# Patient Record
Sex: Female | Born: 1950 | Race: White | Hispanic: No | Marital: Married | Smoking: Never smoker
Health system: Southern US, Community
[De-identification: ages and names within clinical notes are randomized; demographics above are authoritative.]

## PROBLEM LIST (undated history)

## (undated) DIAGNOSIS — M1990 Unspecified osteoarthritis, unspecified site: Secondary | ICD-10-CM

## (undated) DIAGNOSIS — E079 Disorder of thyroid, unspecified: Secondary | ICD-10-CM

## (undated) DIAGNOSIS — K219 Gastro-esophageal reflux disease without esophagitis: Secondary | ICD-10-CM

## (undated) DIAGNOSIS — Z8619 Personal history of other infectious and parasitic diseases: Secondary | ICD-10-CM

## (undated) DIAGNOSIS — E559 Vitamin D deficiency, unspecified: Secondary | ICD-10-CM

## (undated) DIAGNOSIS — I10 Essential (primary) hypertension: Secondary | ICD-10-CM

## (undated) HISTORY — DX: Disorder of thyroid, unspecified: E07.9

## (undated) HISTORY — DX: Unspecified osteoarthritis, unspecified site: M19.90

## (undated) HISTORY — DX: Personal history of other infectious and parasitic diseases: Z86.19

## (undated) HISTORY — DX: Vitamin D deficiency, unspecified: E55.9

## (undated) HISTORY — DX: Gastro-esophageal reflux disease without esophagitis: K21.9

---

## 1997-06-21 HISTORY — PX: CHOLECYSTECTOMY: SHX55

## 2000-01-26 DIAGNOSIS — M8580 Other specified disorders of bone density and structure, unspecified site: Secondary | ICD-10-CM | POA: Insufficient documentation

## 2003-02-12 DIAGNOSIS — E782 Mixed hyperlipidemia: Secondary | ICD-10-CM | POA: Insufficient documentation

## 2003-12-27 DIAGNOSIS — K219 Gastro-esophageal reflux disease without esophagitis: Secondary | ICD-10-CM | POA: Insufficient documentation

## 2005-08-30 DIAGNOSIS — M76899 Other specified enthesopathies of unspecified lower limb, excluding foot: Secondary | ICD-10-CM | POA: Insufficient documentation

## 2006-03-30 DIAGNOSIS — Z803 Family history of malignant neoplasm of breast: Secondary | ICD-10-CM | POA: Insufficient documentation

## 2006-03-30 DIAGNOSIS — N6019 Diffuse cystic mastopathy of unspecified breast: Secondary | ICD-10-CM | POA: Insufficient documentation

## 2006-05-09 ENCOUNTER — Encounter: Admission: RE | Admit: 2006-05-09 | Discharge: 2006-05-09 | Payer: Self-pay | Admitting: Internal Medicine

## 2007-05-11 ENCOUNTER — Encounter: Admission: RE | Admit: 2007-05-11 | Discharge: 2007-05-11 | Payer: Self-pay

## 2008-05-13 ENCOUNTER — Encounter: Admission: RE | Admit: 2008-05-13 | Discharge: 2008-05-13 | Payer: Self-pay | Admitting: Obstetrics and Gynecology

## 2008-05-20 ENCOUNTER — Encounter: Admission: RE | Admit: 2008-05-20 | Discharge: 2008-05-20 | Payer: Self-pay | Admitting: Obstetrics and Gynecology

## 2008-06-21 HISTORY — PX: PARATHYROIDECTOMY: SHX19

## 2009-01-06 DIAGNOSIS — E213 Hyperparathyroidism, unspecified: Secondary | ICD-10-CM | POA: Insufficient documentation

## 2009-02-27 ENCOUNTER — Encounter (HOSPITAL_COMMUNITY): Admission: RE | Admit: 2009-02-27 | Discharge: 2009-04-25 | Payer: Self-pay | Admitting: Endocrinology

## 2009-04-25 ENCOUNTER — Ambulatory Visit (HOSPITAL_COMMUNITY): Admission: RE | Admit: 2009-04-25 | Discharge: 2009-04-25 | Payer: Self-pay | Admitting: Surgery

## 2009-04-25 ENCOUNTER — Encounter (INDEPENDENT_AMBULATORY_CARE_PROVIDER_SITE_OTHER): Payer: Self-pay | Admitting: Surgery

## 2009-05-14 ENCOUNTER — Encounter: Admission: RE | Admit: 2009-05-14 | Discharge: 2009-05-14 | Payer: Self-pay | Admitting: Obstetrics and Gynecology

## 2009-07-01 ENCOUNTER — Encounter: Admission: RE | Admit: 2009-07-01 | Discharge: 2009-07-01 | Payer: Self-pay | Admitting: Surgery

## 2010-05-19 ENCOUNTER — Encounter: Admission: RE | Admit: 2010-05-19 | Discharge: 2010-05-19 | Payer: Self-pay | Admitting: Obstetrics and Gynecology

## 2010-06-21 LAB — HM DEXA SCAN

## 2010-07-01 ENCOUNTER — Encounter
Admission: RE | Admit: 2010-07-01 | Discharge: 2010-07-01 | Payer: Self-pay | Source: Home / Self Care | Attending: Surgery | Admitting: Surgery

## 2010-07-13 ENCOUNTER — Encounter: Payer: Self-pay | Admitting: Endocrinology

## 2010-08-24 ENCOUNTER — Other Ambulatory Visit: Payer: Self-pay | Admitting: Internal Medicine

## 2010-08-28 ENCOUNTER — Ambulatory Visit
Admission: RE | Admit: 2010-08-28 | Discharge: 2010-08-28 | Disposition: A | Discharge status: Home / Self Care | Payer: BC Managed Care – HMO | Source: Ambulatory Visit | Attending: Internal Medicine | Admitting: Internal Medicine

## 2010-09-23 LAB — URINALYSIS, ROUTINE W REFLEX MICROSCOPIC
Bilirubin Urine: NEGATIVE
Glucose, UA: NEGATIVE mg/dL
Hgb urine dipstick: NEGATIVE
Ketones, ur: NEGATIVE mg/dL
Nitrite: NEGATIVE
Protein, ur: NEGATIVE mg/dL
Specific Gravity, Urine: 1.024 (ref 1.005–1.030)
Urobilinogen, UA: 0.2 mg/dL (ref 0.0–1.0)
pH: 6 (ref 5.0–8.0)

## 2010-09-23 LAB — DIFFERENTIAL
Basophils Absolute: 0 10*3/uL (ref 0.0–0.1)
Basophils Relative: 0 % (ref 0–1)
Eosinophils Absolute: 0.1 10*3/uL (ref 0.0–0.7)
Eosinophils Relative: 1 % (ref 0–5)
Lymphocytes Relative: 17 % (ref 12–46)
Lymphs Abs: 1.3 10*3/uL (ref 0.7–4.0)
Monocytes Absolute: 0.6 10*3/uL (ref 0.1–1.0)
Monocytes Relative: 7 % (ref 3–12)
Neutro Abs: 6 10*3/uL (ref 1.7–7.7)
Neutrophils Relative %: 75 % (ref 43–77)

## 2010-09-23 LAB — BASIC METABOLIC PANEL
BUN: 11 mg/dL (ref 6–23)
CO2: 29 mEq/L (ref 19–32)
Calcium: 10.5 mg/dL (ref 8.4–10.5)
Chloride: 109 mEq/L (ref 96–112)
Creatinine, Ser: 0.8 mg/dL (ref 0.4–1.2)
GFR calc Af Amer: >60 mL/min (ref >60)
GFR calc non Af Amer: >60 mL/min (ref >60)
Glucose, Bld: 95 mg/dL (ref 70–99)
Potassium: 4.5 mEq/L (ref 3.5–5.1)
Sodium: 141 mEq/L (ref 135–145)

## 2010-09-23 LAB — CBC
HCT: 41.8 % (ref 36.0–46.0)
Hemoglobin: 14 g/dL (ref 12.0–15.0)
MCHC: 33.6 g/dL (ref 30.0–36.0)
MCV: 90 fL (ref 78.0–100.0)
Platelets: 248 10*3/uL (ref 150–400)
RBC: 4.64 MIL/uL (ref 3.87–5.11)
RDW: 13 % (ref 11.5–15.5)
WBC: 8 10*3/uL (ref 4.0–10.5)

## 2010-09-23 LAB — CALCIUM: Calcium: 9.3 mg/dL (ref 8.4–10.5)

## 2010-09-23 LAB — PROTIME-INR
INR: 0.91 (ref 0.00–1.49)
Prothrombin Time: 12.2 seconds (ref 11.6–15.2)

## 2011-04-13 ENCOUNTER — Other Ambulatory Visit: Payer: Self-pay | Admitting: Obstetrics and Gynecology

## 2011-04-13 DIAGNOSIS — Z1231 Encounter for screening mammogram for malignant neoplasm of breast: Secondary | ICD-10-CM

## 2011-04-22 LAB — HM PAP SMEAR: HM Pap smear: NEGATIVE

## 2011-05-21 ENCOUNTER — Ambulatory Visit
Admission: RE | Admit: 2011-05-21 | Discharge: 2011-05-21 | Disposition: A | Discharge status: Home / Self Care | Payer: BC Managed Care – HMO | Source: Ambulatory Visit | Attending: Obstetrics and Gynecology | Admitting: Obstetrics and Gynecology

## 2011-05-21 DIAGNOSIS — Z1231 Encounter for screening mammogram for malignant neoplasm of breast: Secondary | ICD-10-CM

## 2011-07-31 ENCOUNTER — Emergency Department (HOSPITAL_COMMUNITY)
Admission: EM | Admit: 2011-07-31 | Discharge: 2011-08-01 | Disposition: A | Discharge status: Home / Self Care | Payer: BC Managed Care – PPO | Attending: Emergency Medicine | Admitting: Emergency Medicine

## 2011-07-31 ENCOUNTER — Emergency Department (HOSPITAL_COMMUNITY): Payer: BC Managed Care – PPO

## 2011-07-31 ENCOUNTER — Encounter (HOSPITAL_COMMUNITY): Payer: Self-pay | Admitting: *Deleted

## 2011-07-31 DIAGNOSIS — M79609 Pain in unspecified limb: Secondary | ICD-10-CM | POA: Insufficient documentation

## 2011-07-31 DIAGNOSIS — S92353A Displaced fracture of fifth metatarsal bone, unspecified foot, initial encounter for closed fracture: Secondary | ICD-10-CM

## 2011-07-31 DIAGNOSIS — I10 Essential (primary) hypertension: Secondary | ICD-10-CM | POA: Insufficient documentation

## 2011-07-31 DIAGNOSIS — S92309A Fracture of unspecified metatarsal bone(s), unspecified foot, initial encounter for closed fracture: Secondary | ICD-10-CM | POA: Insufficient documentation

## 2011-07-31 DIAGNOSIS — X500XXA Overexertion from strenuous movement or load, initial encounter: Secondary | ICD-10-CM | POA: Insufficient documentation

## 2011-07-31 DIAGNOSIS — Z79899 Other long term (current) drug therapy: Secondary | ICD-10-CM | POA: Insufficient documentation

## 2011-07-31 HISTORY — DX: Essential (primary) hypertension: I10

## 2011-07-31 MED ORDER — OXYCODONE-ACETAMINOPHEN 5-325 MG PO TABS: 1.0000 | ORAL_TABLET | Freq: Once | ORAL | Status: DC

## 2011-07-31 MED ORDER — OXYCODONE-ACETAMINOPHEN 5-325 MG PO TABS: 1.0000 | ORAL_TABLET | ORAL | Status: AC | PRN

## 2011-07-31 NOTE — ED Notes (Signed)
Pt states that she hurt her left foot the other day and she had a "cramp" in it , states that she "turned it over today" and now she is having pain in her left foot

## 2011-07-31 NOTE — ED Provider Notes (Signed)
History     CSN: [id number]  Arrival date & time 07/31/11  2104   First MD Initiated Contact with Patient 07/31/11 2333    HPI This reports she was getting up with him and he twisted her left foot. Reports pain and inability to bear weight on left foot since. Denies significant swelling or bruising. Numbness, tingling, weakness, discoloration. Patient is a 61 y.o. female presenting with foot injury. The history is provided by the patient.  Foot Injury  The incident occurred at home. There was no injury mechanism. The pain is present in the left foot. The pain is moderate. The pain has been constant since onset. Associated symptoms include inability to bear weight. Pertinent negatives include no numbness, no loss of motion, no muscle weakness, no loss of sensation and no tingling. She reports no foreign bodies present. The symptoms are aggravated by bearing weight, activity and palpation. She has tried rest and NSAIDs for the symptoms. The treatment provided no relief.    Past Medical History  Diagnosis Date  . Hypertension     Past Surgical History  Procedure Date  . Cholecystectomy     No family history on file.  History  Substance Use Topics  . Smoking status: Never Smoker   . Smokeless tobacco: Not on file  . Alcohol Use: No    OB History    Grav Para Term Preterm Abortions TAB SAB Ect Mult Living                  Review of Systems  Musculoskeletal:       Foot pain  Neurological: Negative for tingling and numbness.  All other systems reviewed and are negative.    Allergies  Review of patient's allergies indicates no known allergies.  Home Medications   Current Outpatient Rx  Name Route Sig Dispense Refill  . CETIRIZINE HCL 10 MG PO TABS Oral Take 10 mg by mouth daily.    [name] CONJ ESTROG-MEDROXYPROGEST ACE 0.45-1.5 MG PO TABS  1 tablet daily.    . OMEGA-3-ACID ETHYL ESTERS 1 G PO CAPS Oral Take 2 g by mouth 2 (two) times daily.    [name] VITAMIN D (ERGOCALCIFEROL)  50000 UNITS PO CAPS Oral Take 50,000 Units by mouth.      BP 159/77  Pulse 80  Temp(Src) 98.2 F (36.8 C) (Oral)  Resp 18  Ht 5\' 4"  (1.626 m)  Wt 154 lb (69.854 kg)  BMI 26.43 kg/m2  SpO2 100%  Physical Exam  Vitals reviewed. Constitutional: She is oriented to person, place, and time. Vital signs are normal. She appears well-developed and well-nourished. No distress.  HENT:  Head: Normocephalic and atraumatic.  Eyes: Pupils are equal, round, and reactive to light.  Neck: Neck supple.  Pulmonary/Chest: Effort normal.  Musculoskeletal:       Left foot: She exhibits decreased range of motion, tenderness and bony tenderness. She exhibits no swelling, normal capillary refill, no crepitus, no deformity and no laceration.       Feet:  Neurological: She is alert and oriented to person, place, and time.  Skin: Skin is warm and dry. No rash noted. No erythema. No pallor.  Psychiatric: She has a normal mood and affect. Her behavior is normal.    ED Course  Procedures  Dg Foot Complete Left  07/31/2011  *RADIOLOGY REPORT*  Clinical Data: Fifth metatarsal pain after twisting injury 2 days ago.  LEFT FOOT - COMPLETE 3+ VIEW  Comparison: None.  Findings: There is  a nondisplaced transverse fracture of the proximal left fifth metatarsal bone.  Left foot appears otherwise intact.  No focal bone lesion or bone destruction.  No subluxation. No radiopaque foreign bodies in the soft tissues.  IMPRESSION: Nondisplaced transverse fracture of the proximal left fifth metatarsal bone.  Original Report Authenticated By: Marlon Pel, M.D.    MDM   This patient posterior splint and crutches. Advised patient to followup with orthopedic physician later on next week for close management and evaluation. Patient and spouse agreeable to plan and are in discharge     Thomasene Lot, PA-C 07/31/11 2345  Thomasene Lot, PA-C 07/31/11 [phone]

## 2011-08-01 NOTE — ED Provider Notes (Signed)
Medical screening examination/treatment/procedure(s) were performed by non-physician practitioner and as supervising physician I was immediately available for consultation/collaboration.  Jasmine Awe, MD 08/01/11 [phone]

## 2012-04-11 ENCOUNTER — Other Ambulatory Visit: Payer: Self-pay | Admitting: Obstetrics and Gynecology

## 2012-04-11 DIAGNOSIS — Z1231 Encounter for screening mammogram for malignant neoplasm of breast: Secondary | ICD-10-CM

## 2012-04-28 ENCOUNTER — Other Ambulatory Visit: Payer: Self-pay | Admitting: Obstetrics and Gynecology

## 2012-04-28 DIAGNOSIS — N6310 Unspecified lump in the right breast, unspecified quadrant: Secondary | ICD-10-CM

## 2012-05-01 ENCOUNTER — Telehealth: Payer: Self-pay | Admitting: Genetic Counselor

## 2012-05-01 NOTE — Telephone Encounter (Signed)
LVOM for pt to return call.  °

## 2012-05-03 ENCOUNTER — Encounter (INDEPENDENT_AMBULATORY_CARE_PROVIDER_SITE_OTHER): Payer: Self-pay | Admitting: Surgery

## 2012-05-08 ENCOUNTER — Encounter (INDEPENDENT_AMBULATORY_CARE_PROVIDER_SITE_OTHER): Payer: BC Managed Care – HMO | Admitting: Surgery

## 2012-05-08 LAB — HM MAMMOGRAPHY

## 2012-05-09 ENCOUNTER — Ambulatory Visit (INDEPENDENT_AMBULATORY_CARE_PROVIDER_SITE_OTHER): Payer: BC Managed Care – HMO | Admitting: Surgery

## 2012-05-09 ENCOUNTER — Encounter (INDEPENDENT_AMBULATORY_CARE_PROVIDER_SITE_OTHER): Payer: Self-pay | Admitting: Surgery

## 2012-05-09 VITALS — HR 78 | Temp 99.3°F | Ht 64.5 in | Wt 161.8 lb

## 2012-05-09 DIAGNOSIS — R1013 Epigastric pain: Secondary | ICD-10-CM

## 2012-05-09 DIAGNOSIS — G8929 Other chronic pain: Secondary | ICD-10-CM | POA: Insufficient documentation

## 2012-05-09 NOTE — Progress Notes (Signed)
Subjective:     Patient ID: EVELETTE PALLAN, female   DOB: Sep 12, 1950, 61 y.o.   MRN: [id number]  HPI This is a very pleasant 61 year old female who I know personally. I am seeing her for both had breast problems and abdominal problem referred by Dr. Thea Silversmith.  Her gynecologist actually felt a small nodule around the right areola. She has had no problems with her breasts and no nipple discharge. She has been having pain in the epigastric area with physical activity for the last several months. She feels an occasional knot with sharp discomfort in her upper down the wall. She has had no nausea or vomiting. She has had a previous laparoscopic cholecystectomy  Review of Systems Otherwise unremarkable    Objective:   Physical Exam There is no right axillary adenopathy. I can feel a small cyst at the areola at the 1:00 position. Otherwise there are no breast masses. She has a tiny reducible umbilical hernia. There is a fullness in her epigastric area which is tender. I cannot feel a fascial defect. There may be incarcerated fat and a hernia defect.  Her mammograms and an ultrasound showed only a slightly dilated ductal of the right breast with no other abnormalities and no masses    Assessment:     Abdominal pain of uncertain etiology    Plan:     Given her exam and her strong family history of malignancy, I able to get a CAT scan of her abdomen and pelvis to better evaluate the abdominal wall and let me assess the source of her discomfort. I will call her back with the results

## 2012-05-12 ENCOUNTER — Ambulatory Visit
Admission: RE | Admit: 2012-05-12 | Discharge: 2012-05-12 | Disposition: A | Discharge status: Home / Self Care | Payer: BC Managed Care – PPO | Source: Ambulatory Visit | Attending: Surgery | Admitting: Surgery

## 2012-05-12 DIAGNOSIS — G8929 Other chronic pain: Secondary | ICD-10-CM

## 2012-05-12 MED ORDER — IOHEXOL 300 MG/ML  SOLN
100.0000 mL | Freq: Once | INTRAMUSCULAR | Status: AC | PRN
  Administered 2012-05-12: 100 mL via INTRAVENOUS

## 2012-05-24 ENCOUNTER — Other Ambulatory Visit: Payer: BC Managed Care – PPO

## 2012-05-24 ENCOUNTER — Ambulatory Visit: Payer: BC Managed Care – PPO

## 2012-06-09 ENCOUNTER — Ambulatory Visit (INDEPENDENT_AMBULATORY_CARE_PROVIDER_SITE_OTHER): Payer: BC Managed Care – HMO | Admitting: General Surgery

## 2012-06-23 ENCOUNTER — Encounter (INDEPENDENT_AMBULATORY_CARE_PROVIDER_SITE_OTHER): Payer: BC Managed Care – HMO | Admitting: Surgery

## 2012-07-20 ENCOUNTER — Other Ambulatory Visit: Payer: BC Managed Care – PPO | Admitting: Lab

## 2012-07-20 ENCOUNTER — Ambulatory Visit (HOSPITAL_BASED_OUTPATIENT_CLINIC_OR_DEPARTMENT_OTHER): Payer: BC Managed Care – PPO | Admitting: Genetic Counselor

## 2012-07-20 ENCOUNTER — Encounter: Payer: Self-pay | Admitting: Genetic Counselor

## 2012-07-20 DIAGNOSIS — IMO0002 Reserved for concepts with insufficient information to code with codable children: Secondary | ICD-10-CM

## 2012-07-20 DIAGNOSIS — Z806 Family history of leukemia: Secondary | ICD-10-CM

## 2012-07-20 DIAGNOSIS — Z800 Family history of malignant neoplasm of digestive organs: Secondary | ICD-10-CM

## 2012-07-20 DIAGNOSIS — Z803 Family history of malignant neoplasm of breast: Secondary | ICD-10-CM

## 2012-07-20 NOTE — Progress Notes (Signed)
Dr.  Aram Beecham Romine requested a consultation for genetic counseling and risk assessment for Jessica Crosby, a 62 y.o. female, for discussion of her family history of breast, prostate, pancreatic, colon and thyroid cancer. She presents to clinic today to discuss the possibility of a genetic predisposition to cancer, and to further clarify her risks, as well as her family members' risks for cancer.   HISTORY OF PRESENT ILLNESS: Jessica Crosby is a 62 y.o. female with no personal history of cancer.    Past Medical History  Diagnosis Date  . Hypertension   . GERD (gastroesophageal reflux disease)   . Thyroid disease   . OP (osteoporosis)   . Vitamin D deficiency   . Arthritis     Past Surgical History  Procedure Date  . Cholecystectomy   . Parathyroidectomy 2010    History  Substance Use Topics  . Smoking status: Never Smoker   . Smokeless tobacco: Not on file  . Alcohol Use: No    REPRODUCTIVE HISTORY AND PERSONAL RISK ASSESSMENT FACTORS: Menarche was at age 61.   Menopause at 45 Uterus Intact: Yes Ovaries Intact: Yes G3P3A0 , first live birth at age 26  She has not previously undergone treatment for infertility.   OCP use for 15 years   She has used HRT in the past.    FAMILY HISTORY:  We obtained a detailed, 4-generation family history.  Significant diagnoses are listed below: Family History  Problem Relation Age of Onset  . Breast cancer Sister 65  . Prostate cancer Father 65  . Breast cancer Maternal Aunt 81  . Prostate cancer Paternal Uncle   . Throat cancer Paternal Grandfather     pipe smoker  . Breast cancer Sister 36  . Lymphoma Sister 31  . Leukemia Sister 63    as a result of chemotherapy from her lymphoma  . Pancreatic cancer Maternal Aunt 65  . Colon cancer Maternal Aunt     diagnosed in her 26s  . Thyroid cancer Maternal Aunt     dx in her 15s  . Leukemia Maternal Aunt     diagnosed in her 40s  . Breast cancer Cousin    diagnosed in her 65s  . Prostate cancer Paternal Uncle   The patient has not been diagnosed with cancer.  She has three brothers and four sisters.  One sister was diagnosed with breast cancer at age 21 and tested negative for BRCA mutations, a second sister was diagnosed with breast cancer and tested negative for BRCA mutations, and a third sister was diagnosed with non-hodgkins lymphoma in the socket of her shoulder and leukemia as the result of her chemotherapy.  The patient's father and two of his four brothers were diagnosed with prostate cancer in their 53s.  The patient's mother died at 27 and never had cancer.  She had nine brothers and sisters.  One sister had breast cancer at 70, another had pancreatic cancer at 33, a third had colon cancer in her 17s and thyroid cancer in her 65s, and a fourth had leukemia in her 67s.  A fifth sister had a daughter who had breast cancer in her 55s.  There is no other reported history of cancer.  Patient's maternal ancestors are of [place] descent, and paternal ancestors are of [place] descent. There is no reported Ashkenazi Jewish ancestry. There is no  known consanguinity.  GENETIC COUNSELING RISK ASSESSMENT, DISCUSSION, AND SUGGESTED FOLLOW UP: We reviewed the natural history and genetic etiology  of sporadic, familial and hereditary cancer syndromes.  About 5-10% of breast cancer is hereditary.  Of this, about 85% is the result of a BRCA1 or BRCA2 mutation.  We reviewed the red flags of hereditary cancer syndromes and the dominant inheritance patterns.  If the BRCA testing is negative, we discussed that we could be testing for the wrong gene.  We discussed gene panels, and that several cancer genes that are associated with different cancers can be tested at the same time.  Because of the different types of cancer that are in the patient's family, we will consider one of the panel tests if she is negative for BRCA mutations.   The patient's family history of  breast, pancreatic, colon and thyroid cancer is suggestive of the following possible diagnosis: hereditary cancer syndrome  We discussed that identification of a hereditary cancer syndrome may help her care providers tailor the patients medical management. If a mutation indicating a hereditary cancer syndrome is detected in this case, the [employer] recommendations would include increased cancer surveillance and possible prophylactic surgery. If a mutation is detected, the patient will be referred back to the referring provider and to any additional appropriate care providers to discuss the relevant options.   If a mutation is not found in the patient, cancer surveillance options would be discussed for the patient according to the appropriate standard National Comprehensive Cancer Network and American Cancer Society guidelines, with consideration of their personal and family history risk factors. In this case, the patient will be referred back to their care providers for discussions of management.   Based on the patient's personal and family history, statistical models (Tyrer Cusik)  and literature data were used to estimate her risk of developing breast cancer. These estimate her lifetime risk of developing breast to be approximately 16.4%. This estimation does not take into account any genetic testing results.   After considering the risks, benefits, and limitations, the patient provided informed consent for  the following  testing: BRACAnalysis and MyRisk through [employer].   Per the patient's request, we will contact her by telephone to discuss these results. A follow up genetic counseling visit will be scheduled if indicated.  The patient was seen for a total of 60 minutes, greater than 50% of which was spent face-to-face counseling.  This plan is being carried out per Dr. Edyth Gunnels recommendations.  This note will also be sent to the referring  provider via the electronic medical record. The patient will be supplied with a summary of this genetic counseling discussion as well as educational information on the discussed hereditary cancer syndromes following the conclusion of their visit.   Patient was discussed with Dr. Drue Second.  _______________________________________________________________________ For Office Staff:  Number of people involved in session: 2 Was an Intern/ student involved with case: no

## 2012-08-15 ENCOUNTER — Telehealth: Payer: Self-pay | Admitting: Genetic Counselor

## 2012-08-15 NOTE — Telephone Encounter (Signed)
Revealed negative genetic test results 

## 2012-08-23 ENCOUNTER — Encounter: Payer: Self-pay | Admitting: Genetic Counselor

## 2012-09-04 ENCOUNTER — Other Ambulatory Visit: Payer: Self-pay | Admitting: Obstetrics and Gynecology

## 2012-09-04 DIAGNOSIS — Z803 Family history of malignant neoplasm of breast: Secondary | ICD-10-CM

## 2012-10-24 ENCOUNTER — Encounter: Payer: Self-pay | Admitting: Obstetrics and Gynecology

## 2012-10-25 ENCOUNTER — Encounter: Payer: Self-pay | Admitting: Obstetrics and Gynecology

## 2012-10-25 ENCOUNTER — Ambulatory Visit (INDEPENDENT_AMBULATORY_CARE_PROVIDER_SITE_OTHER): Payer: BC Managed Care – PPO | Admitting: Obstetrics and Gynecology

## 2012-10-25 VITALS — BP 138/80 | Wt 164.0 lb

## 2012-10-25 DIAGNOSIS — Z803 Family history of malignant neoplasm of breast: Secondary | ICD-10-CM

## 2012-10-25 DIAGNOSIS — N951 Menopausal and female climacteric states: Secondary | ICD-10-CM

## 2012-10-25 MED ORDER — ESTRADIOL 0.0375 MG/24HR TD PTTW: 1.0000 | MEDICATED_PATCH | TRANSDERMAL | Status: DC

## 2012-10-25 MED ORDER — PROGESTERONE MICRONIZED 100 MG PO CAPS: 100.0000 mg | ORAL_CAPSULE | Freq: Every day | ORAL | Status: DC

## 2012-10-25 NOTE — Patient Instructions (Signed)
Please call me back if you have not heard about scheduling your MRI by one week from now.

## 2012-10-25 NOTE — Progress Notes (Signed)
62 yo MWF G3P3 miserable with hot flashes and insomnia, but in Nov 2013 I rec: she stop her HRT because she has a FH of breast can in two sisters premenopausally.  Genetic panel was done on pt and was negative. We tried prozac starting in March 2014 to see if that would help her sx's w/o having to use estrogen, but she has not noticed an improvement and really wants to go back on estrogen.  We discussed the risks of HRT including the possibility that it could increase her already increased risk of getting breast cancer or ovarian cancer, and she states she understands but her QOL is so impaired by her hot flashes and insomnia, she accepts these risks.  Pt had been on prempro since age 69.  0.45/1.5 mg prempro.  She has tried several OTC products with no help.  Use cold packs prn.  Thermostat at 62 degrees, and husband is miserable.  Has hot flashes "all day and all night".  "i can't do this anymore"    Pt on my breast exam in Nov 2013 had a 2 cm right breast mass at 3 o'clock and dx mm  And usg were neg.  On re-exam, I still felt it, and sent her to a surgeon for eval.  Dr. Rayburn Ma felt it and felt it was a cyst and that bx was not necessary.  I saw her back and our plan was to examine her every 6 months, have yearly mm, and yearly breast mri. Pt "never got around to doing the MRI"  But states she will go now.  Rx minivell 0.0375 and Prometrium 100 mg q hs, and reorder mm and MRI.  Exam:  Breasts appear symm.  No adenop.  No skin change or nipple d/c.  Left w/o mass. Right with the same 2 cm mass at 3 o'clock just outside the ariola.  Today it feels unchanged since previous exam, and quite mobile.  It is quite firm, however.    With pt so eager to go back on HRT, I strongly rec: MRI which she states she will do.  Will schedule.

## 2012-10-27 ENCOUNTER — Telehealth: Payer: Self-pay | Admitting: Obstetrics and Gynecology

## 2012-10-27 MED ORDER — PROGESTERONE MICRONIZED 100 MG PO CAPS: 100.0000 mg | ORAL_CAPSULE | Freq: Every day | ORAL | Status: DC

## 2012-10-27 MED ORDER — ESTRADIOL 0.0375 MG/24HR TD PTTW: 1.0000 | MEDICATED_PATCH | TRANSDERMAL | Status: DC

## 2012-10-27 NOTE — Telephone Encounter (Signed)
[facility]: [id number] patient needs 3 month increments for mail order rx's for: Estradiol patch .0375 and progesterone 100mg s.

## 2012-10-27 NOTE — Telephone Encounter (Signed)
Medications below sent to local pharmacy on 10/25/12.  Per pharmacist, pt picked up progesterone, but did not pick up Minivelle.  Requested pharmacist cancel [facility] and remaining refills on progesterone.   Medications sent to mail order as requested.  Refills decreased by one on progesterone as pt has already used one and message to [facility] to hold until pt requests refill.

## 2012-11-02 ENCOUNTER — Telehealth: Payer: Self-pay | Admitting: Obstetrics and Gynecology

## 2012-11-02 NOTE — Telephone Encounter (Signed)
Jessica Crosby's last appointment was on 10/25/12. She was told by Dr.Romine that she would receive a phone call last week in regards to setting up another appointment for a mammogram and magnetic resonance imaging. Nonetheless, she hasn't received a phone call and is awaiting a response.

## 2012-11-06 ENCOUNTER — Other Ambulatory Visit: Payer: Self-pay | Admitting: Obstetrics and Gynecology

## 2012-11-06 DIAGNOSIS — Z803 Family history of malignant neoplasm of breast: Secondary | ICD-10-CM

## 2012-11-06 DIAGNOSIS — Z1231 Encounter for screening mammogram for malignant neoplasm of breast: Secondary | ICD-10-CM

## 2012-11-07 ENCOUNTER — Telehealth: Payer: Self-pay | Admitting: *Deleted

## 2012-11-07 NOTE — Telephone Encounter (Signed)
Call to [facility] at [facility] regarding patients MRI order.  [facility] report states intermittent monitoring with MRI is recommeded and repeat screening in 1 year (from Nov 2013).  LMTCB.

## 2012-11-07 NOTE — Telephone Encounter (Signed)
Spoke with Jessica Crosby at [facility] to schedule Mammogram prior to MRI being done for Right Breast Mass. States patient would have to pay out of pocket for mammogram to be done since her last mammogram was Nov. 2013 . States would be better for her to wait till November and do all at one time. Kennon Rounds has chart and info and will check on this also. sue

## 2012-11-07 NOTE — Telephone Encounter (Signed)
Spoke with Leana Roe at [facility].  She has reviewed MMG report from [facility] and has scheduled the MRI for next Thurs 11-16-12.  She has advised patient that she is working to get an exception for her repeat MMG requirement and Lynden Ang will call her if the exception is not approved.

## 2012-11-14 NOTE — Telephone Encounter (Addendum)
Spoke with pt about night sweats and insomnia. Pt has been on pills for 3 wks and patch for 2 weeks with no change so far. Pt discouraged because she spent $170 on meds and thought they would have helped by now. Pt is going for breast MRI on Thursday and wanted Dr. Tresa Res to know. Encouraged pt to stay on meds a while longer to see if symptoms improve and we will call back with further advice. Pt on prometrium 100 mg daily and minivelle 0.0375 mg twice weekly. Pt has been using as directed.

## 2012-11-14 NOTE — Telephone Encounter (Signed)
pt states her patch and pills are not working pt wants to talk with the nurse

## 2012-11-15 NOTE — Telephone Encounter (Signed)
Tell patient that she should see results by six weeks of treatment, and if she does not, then lack of estrogen may not be the cause for her hot flashes and insomnia.  Have her come back in for re-evaluation if her sx's do not improve by then.

## 2012-11-16 ENCOUNTER — Ambulatory Visit
Admission: RE | Admit: 2012-11-16 | Discharge: 2012-11-16 | Disposition: A | Discharge status: Home / Self Care | Payer: BC Managed Care – PPO | Source: Ambulatory Visit | Attending: Obstetrics and Gynecology | Admitting: Obstetrics and Gynecology

## 2012-11-16 DIAGNOSIS — Z803 Family history of malignant neoplasm of breast: Secondary | ICD-10-CM

## 2012-11-16 MED ORDER — GADOBENATE DIMEGLUMINE 529 MG/ML IV SOLN
15.0000 mL | Freq: Once | INTRAVENOUS | Status: AC | PRN
  Administered 2012-11-16: 15 mL via INTRAVENOUS

## 2012-11-16 NOTE — Telephone Encounter (Signed)
Spoke with pt about CR advice. Pt to monitor symptoms for 6 weeks on the meds, and call back if not better for OV.

## 2012-11-16 NOTE — Telephone Encounter (Signed)
LMTCB  aa 

## 2013-02-15 ENCOUNTER — Telehealth: Payer: Self-pay | Admitting: Obstetrics and Gynecology

## 2013-02-15 NOTE — Telephone Encounter (Signed)
Spoke with pt who reports her hair has been falling out for about a month and a half. Pt also reports that since starting HRT in May, she has been ravenously hungry and craving sugar. Pt now weighs 166, up from 164 in May, but feels heavier. Eating a lot at night, which is unusual for her until recently. Pt does report her hot flashes are much better now and she is sleeping better without night sweats. Scheduled OV with CR tomorrow at 9:45 per pt request.

## 2013-02-15 NOTE — Telephone Encounter (Signed)
Patient was prescribed Mini Velle  Patch and Progesterone (prometrium) 100 %.  Now she is having weight gain and her hair is falling out . When she is washing her hair it comes out .

## 2013-02-16 ENCOUNTER — Ambulatory Visit (INDEPENDENT_AMBULATORY_CARE_PROVIDER_SITE_OTHER): Payer: BC Managed Care – PPO | Admitting: Obstetrics and Gynecology

## 2013-02-16 ENCOUNTER — Encounter: Payer: Self-pay | Admitting: Obstetrics and Gynecology

## 2013-02-16 VITALS — BP 158/86 | HR 80 | Resp 18 | Ht 64.5 in | Wt 166.0 lb

## 2013-02-16 DIAGNOSIS — F411 Generalized anxiety disorder: Secondary | ICD-10-CM

## 2013-02-16 MED ORDER — FLUOXETINE HCL 10 MG PO TABS: 20.0000 mg | ORAL_TABLET | Freq: Every day | ORAL | Status: DC

## 2013-02-16 NOTE — Patient Instructions (Addendum)
Start your prozac and take one every day by mouth.  Continue your routine care on schedule.  My opinion is that you are better off on the HRT than trying to stop it again now.    Best of health to you in the future!  I'll miss seeing you!  Dr. Tresa Res

## 2013-02-16 NOTE — Progress Notes (Signed)
62 yo MWF G3P3 who started HRT in May 2014 after having stopped it in Nov 2013 and experiencing intolerable vasomotor sx's and insomnia.  Pt has two sisters who have had premenopausal breast cancer, and one of them died last month.  Pt is tearful in relating this info to me.  Pt had genetic testing and was found to be negative.  She comes in today because she is having weight gain especially "around the middle" and she thinks her hair is getting thinner, and she is wondering if it is due to HRT.  We discussed again risks and side effects of HRT, and that at least of her sx's could well be due just to aging.  She was so miserable when off her HRT, I do not think now is a good time to try to stop them again, since she is having such a hard time coming to terms with her sister's death.  I did start her on prozac back in March, and pt thinks she took it until the bottle of 30 ran out, but she doesn't remember why she didn't continue it.  We discussed the option to start back now, and she wants to go back on it.  Rx and instructed in prozac 20 mg po qd.    F/U on schedule for her anex this fall.

## 2013-05-09 ENCOUNTER — Encounter: Payer: Self-pay | Admitting: Obstetrics and Gynecology

## 2013-05-09 ENCOUNTER — Ambulatory Visit: Payer: Self-pay | Admitting: Obstetrics and Gynecology

## 2013-05-09 ENCOUNTER — Ambulatory Visit (INDEPENDENT_AMBULATORY_CARE_PROVIDER_SITE_OTHER): Payer: BC Managed Care – PPO | Admitting: Obstetrics and Gynecology

## 2013-05-09 VITALS — BP 130/78 | HR 80 | Ht 65.0 in | Wt 167.5 lb

## 2013-05-09 DIAGNOSIS — M8580 Other specified disorders of bone density and structure, unspecified site: Secondary | ICD-10-CM

## 2013-05-09 DIAGNOSIS — N6320 Unspecified lump in the left breast, unspecified quadrant: Secondary | ICD-10-CM

## 2013-05-09 DIAGNOSIS — N63 Unspecified lump in unspecified breast: Secondary | ICD-10-CM

## 2013-05-09 DIAGNOSIS — M899 Disorder of bone, unspecified: Secondary | ICD-10-CM

## 2013-05-09 DIAGNOSIS — Z01419 Encounter for gynecological examination (general) (routine) without abnormal findings: Secondary | ICD-10-CM

## 2013-05-09 DIAGNOSIS — N6310 Unspecified lump in the right breast, unspecified quadrant: Secondary | ICD-10-CM

## 2013-05-09 DIAGNOSIS — Z0000 Encounter for general adult medical examination without abnormal findings: Secondary | ICD-10-CM

## 2013-05-09 LAB — POCT URINALYSIS DIPSTICK
Bilirubin, UA: NEGATIVE
Blood, UA: NEGATIVE
Glucose, UA: NEGATIVE
Ketones, UA: NEGATIVE
Leukocytes, UA: NEGATIVE
Nitrite, UA: NEGATIVE
Protein, UA: NEGATIVE
Urobilinogen, UA: NEGATIVE
pH, UA: 5

## 2013-05-09 MED ORDER — PAROXETINE MESYLATE 7.5 MG PO CAPS: 7.5000 mg | ORAL_CAPSULE | Freq: Every morning | ORAL | Status: DC

## 2013-05-09 NOTE — Progress Notes (Signed)
Patient ID: Jessica Crosby, female   DOB: 12-18-1950, 62 y.o.   MRN: [id number] GYNECOLOGY VISIT  PCP:   Shary Decamp, MD  Referring provider:   HPI: 62 y.o.   Married  Caucasian  female   [phone] with Patient's last menstrual period was 06/21/1998.   here for   AEX. Patient is on HRT.  Tried to be off for 6 months but patient had terrible hot flashes. Restarted due to vasomotor symptoms.  Patient's goal overall is to come off HRT. Has tried Prozac in the past and this did not work well for the patient.  Strong family history of breast cancer.  Patient has had negative genetic testing.   Patient has been followed for a right breast mass. Had surgical consultation with Dr. Magnus Ivan, who believes the mass is a cyst - 06/09/12. Had a negative breast MRI on 09/04/12.  Hgb:    PCP Urine:  Neg  GYNECOLOGIC HISTORY: Patient's last menstrual period was 06/21/1998. Sexually active:  yes Partner preference: female Contraception:   postmenopausal Menopausal hormone therapy: Minivelle, Prometrium. DES exposure:   no Blood transfusions:  no  Sexually transmitted diseases:   no GYN Procedures:  none Mammogram:   04/2012 lump right breast but neg. Ultrasound:[facility].  Pt. To schedule mammogram with [facility]             Pap:  2012 wnl  History of abnormal pap smear:  no   OB History   Grav Para Term Preterm Abortions TAB SAB Ect Mult Living   3 3 3       3        LIFESTYLE: Exercise:       Yoga, walking and weights        Tobacco:        no Alcohol:          no Drug use:       no  OTHER HEALTH MAINTENANCE: Tetanus/TDap:    PCP Gardisil:               n/a Influenza:             Has apointment next week Zostavax:             Yes--2013  Bone density:       2012: ?Osteopenia.  Done at [facility] Colonoscopy:       2012 [id number].  Next colonoscopy due 2022.  Cholesterol check:  6 months ago:wnl  Family History  Problem Relation Age of Onset  .  Breast cancer Sister 62  . Prostate cancer Father 74  . Breast cancer Maternal Aunt 81  . Prostate cancer Paternal Uncle   . Throat cancer Paternal Grandfather     pipe smoker  . Breast cancer Sister 26  . Lymphoma Sister 79  . Leukemia Sister 68    as a result of chemotherapy from her lymphoma  . Cancer Sister 79    non hodgins lymphoma/ leukemia  . Pancreatic cancer Maternal Aunt 65  . Colon cancer Maternal Aunt     diagnosed in her 70s  . Thyroid cancer Maternal Aunt     dx in her 85s  . Leukemia Maternal Aunt     diagnosed in her 41s  . Breast cancer Cousin     diagnosed in her 75s  . Prostate cancer Paternal Uncle     Patient Active Problem List   Diagnosis Date Noted  . Abdominal pain, chronic, epigastric 05/09/2012  Past Medical History  Diagnosis Date  . Hypertension   . GERD (gastroesophageal reflux disease)   . Vitamin D deficiency   . Arthritis   . Thyroid disease     hyperparathyroidism  . Hx of scarlet fever     as achild    Past Surgical History  Procedure Laterality Date  . Cholecystectomy  1999  . Parathyroidectomy Right 2010    ALLERGIES: Review of patient's allergies indicates no known allergies.  Current Outpatient Prescriptions  Medication Sig Dispense Refill  . aspirin 81 MG tablet Take 81 mg by mouth daily.      . calcium carbonate (OS-CAL) 600 MG TABS Take 600 mg by mouth 2 (two) times daily with a meal.      . cetirizine (ZYRTEC) 10 MG tablet Take 10 mg by mouth daily.      . Cholecalciferol (VITAMIN D PO) Take 1,000 mg by mouth daily.       [name] estradiol (MINIVELLE) 0.0375 MG/24HR Place 1 patch onto the skin 2 (two) times a week.  24 patch  3  . hydrochlorothiazide (HYDRODIURIL) 12.5 MG tablet Take 12.5 mg by mouth daily.      [name] omega-3 acid ethyl esters (LOVAZA) 1 G capsule Take 2 g by mouth 2 (two) times daily.      . progesterone (PROMETRIUM) 100 MG capsule Take 1 capsule (100 mg total) by mouth daily.  90 capsule  2   No current  facility-administered medications for this visit.     ROS:  Pertinent items are noted in HPI.  SOCIAL HISTORY:  3 sets of twins within sibling groups. 3 children and 6 grandchildren.    PHYSICAL EXAMINATION:    BP 130/78  Pulse 80  Ht 5\' 5"  (1.651 m)  Wt 167 lb 8 oz (75.978 kg)  BMI 27.87 kg/m2  LMP 06/21/1998   Wt Readings from Last 3 Encounters:  05/09/13 167 lb 8 oz (75.978 kg)  02/16/13 166 lb (75.297 kg)  10/25/12 164 lb (74.39 kg)     Ht Readings from Last 3 Encounters:  05/09/13 5\' 5"  (1.651 m)  02/16/13 5' 4.5" (1.638 m)  05/09/12 5' 4.5" (1.638 m)    General appearance: alert, cooperative and appears stated age Head: Normocephalic, without obvious abnormality, atraumatic Neck: no adenopathy, supple, symmetrical, trachea midline and thyroid not enlarged, symmetric, no tenderness/mass/nodules Lungs: clear to auscultation bilaterally Breasts: Inspection negative, No nipple retraction or dimpling, No nipple discharge or bleeding, No axillary or supraclavicular adenopathy,  Small 1 cm ridge like lump at 2 o'clock of left breast and 9 o'clock of the right breast. Heart: regular rate and rhythm Abdomen: soft, non-tender; no masses,  no organomegaly Extremities: extremities normal, atraumatic, no cyanosis or edema Skin: Skin color, texture, turgor normal. No rashes or lesions Lymph nodes: Cervical, supraclavicular, and axillary nodes normal. No abnormal inguinal nodes palpated Neurologic: Grossly normal  Pelvic: External genitalia:  no lesions              Urethra:  normal appearing urethra with no masses, tenderness or lesions              Bartholins and Skenes: normal                 Vagina: normal appearing vagina with normal color and discharge, no lesions              Cervix: normal appearance              Pap  and high risk HPV testing done: yes.            Bimanual Exam:  Uterus:  uterus is normal size, shape, consistency and nontender                                       Adnexa: normal adnexa in size, nontender and no masses                                      Rectovaginal: Confirms                                      Anus:  normal sphincter tone, no lesions  ASSESSMENT  Bilateral breast lumps. Menopausal symptoms. Strong family history of breast cancer. Osteopenia.   PLAN  Mammogram diagnostic bilateral with bilateral breast ultrasound.  Pap smear and high risk HPV testing Counseled on stopping HRT.  Patient will start Brisdelle and stop HRT after New Year's.  I discussed Brisdelle and side effects with the patient. Bone density. Medications per Epic orders Follow up in three months for medication recheck.  Return annually or prn   An After Visit Summary was printed and given to the patient.

## 2013-05-09 NOTE — Progress Notes (Signed)
Patient scheduled for bilateral diagnostic and bilateral breast u/s and Dexa. Patient will get copies of films from [facility] for [facility] appointment. Will request copy of BMD from [facility]. Appointment scheduled while patient in office for 06/22/13 at 0900. Patient agreeable to time/location.

## 2013-05-09 NOTE — Patient Instructions (Addendum)
Paroxetine capsules What is this medicine? PAROXETINE (pa ROX e teen) is used to treat hot flashes due to menopause. This medicine may be used for other purposes; ask your health care provider or pharmacist if you have questions. COMMON BRAND NAME(S): Brisdelle What should I tell my health care provider before I take this medicine? They need to know if you have any of these conditions: -bleeding disorders -glaucoma -heart disease -kidney disease -liver disease -low levels of sodium in the blood -mania or bipolar disorder -seizures -suicidal thoughts, plans, or attempt; a previous suicide attempt by you or a family member -take MAOIs like Carbex, Eldepryl, Marplan, Nardil, and Parnate -take medicines that treat or prevent blood clots -an unusual or allergic reaction to paroxetine, other medicines, foods, dyes, or preservatives -pregnant or trying to get pregnant -breast-feeding How should I use this medicine? Take this medicine by mouth once daily at bedtime. Follow the directions on the prescription label. This medicine can be taken with or without food. Take your medicine at regular intervals. Do not take your medicine more often than directed. A special MedGuide will be given to you by the pharmacist with each prescription and refill. Be sure to read this information carefully each time. Overdosage: If you think you've taken too much of this medicine contact a poison control center or emergency room at once. Overdosage: If you think you have taken too much of this medicine contact a poison control center or emergency room at once. NOTE: This medicine is only for you. Do not share this medicine with others. What if I miss a dose? If you miss a dose, take it as soon as you can. If it is almost time for your next dose, take only that dose. Do not take double or extra doses. What may interact with this medicine? Do not take this medicine with any of the following  medications: -linezolid -MAOIs like Carbex, Eldepryl, Marplan, Nardil, and Parnate -methylene blue (injected into a vein) -pimozide -thioridazine This medicine may also interact with the following medications: -alcohol -aspirin and aspirin-like medicines -atomoxetine -certain medicines for depression, anxiety, or psychotic disturbances -certain medicines for irregular heart beat like propafenone, flecainide, encainide, and quinidine -certain medicines for migraine headache like almotriptan, eletriptan, frovatriptan, naratriptan, rizatriptan, sumatriptan, zolmitriptan -cimetidine -digoxin -diuretics -fentanyl -fosamprenavir -furazolidone -isoniazid -lithium -medicines that treat or prevent blood clots like warfarin, enoxaparin, and dalteparin -medicines for sleep -NSAIDs, medicines for pain and inflammation, like ibuprofen or naproxen -phenobarbital -phenytoin -procarbazine -rasagiline -ritonavir -supplements like St. John's wort, kava kava, valerian -tamoxifen -tramadol -tryptophan This list may not describe all possible interactions. Give your health care provider a list of all the medicines, herbs, non-prescription drugs, or dietary supplements you use. Also tell them if you smoke, drink alcohol, or use illegal drugs. Some items may interact with your medicine. What should I watch for while using this medicine? Tell your doctor or healthcare professional if your symptoms do not start to get better or if they get worse. Visit your doctor or health care professional for regular checks on your progress. Patients and their families should watch out for new or worsening thoughts of suicide or depression. Also watch out for sudden changes in feelings such as feeling anxious, agitated, panicky, irritable, hostile, aggressive, impulsive, severely restless, overly excited and hyperactive, or not being able to sleep. If this happens, especially at the beginning of treatment or after a  change in dose, call your health care professional. You may get drowsy or dizzy. Do   not drive, use machinery, or do anything that needs mental alertness until you know how this medicine affects you. Do not stand or sit up quickly, especially if you are an older patient. This reduces the risk of dizzy or fainting spells. Alcohol may interfere with the effect of this medicine. Avoid alcoholic drinks. Your mouth may get dry. Chewing sugarless gum, sucking hard candy and drinking plenty of water will help. Contact your doctor if the problem does not go away or is severe. Women should inform their doctor if they wish to become pregnant or think they might be pregnant. There is a potential for serious side effects to an unborn child. Talk to your health care professional or pharmacist for more information. Do not become pregnant while taking this medicine. What side effects may I notice from receiving this medicine? Side effects that you should report to your doctor or health care professional as soon as possible: -allergic reactions like skin rash, itching or hives, swelling of the face, lips, or tongue -changes in emotions or moods -confusion -depression -feeling faint or lightheaded, falls -seizures -suicidal thoughts or actions -unusual bleeding or bruising -unusually weak or tired -weakness Side effects that usually do not require medical attention (Report these to your doctor or health care professional if they continue or are bothersome.): -change in sex drive or performance -fatigue -drowsiness -headache -insomnia -nausea/vomiting -upset stomach This list may not describe all possible side effects. Call your doctor for medical advice about side effects. You may report side effects to FDA at 1-800-FDA-1088. Where should I keep my medicine? Keep out of the reach of children. Store at room temperature between 20 and 25 degrees C (68 and 77 degrees F). Throw away any unused medicine after  the expiration date. NOTE: This sheet is a summary. It may not cover all possible information. If you have questions about this medicine, talk to your doctor, pharmacist, or health care provider.  2014, Elsevier/Gold Standard. (2012-02-07 12:26:17)  EXERCISE AND DIET:  We recommended that you start or continue a regular exercise program for good health. Regular exercise means any activity that makes your heart beat faster and makes you sweat.  We recommend exercising at least 30 minutes per day at least 3 days a week, preferably 4 or 5.  We also recommend a diet low in fat and sugar.  Inactivity, poor dietary choices and obesity can cause diabetes, heart attack, stroke, and kidney damage, among others.    ALCOHOL AND SMOKING:  Women should limit their alcohol intake to no more than 7 drinks/beers/glasses of wine (combined, not each!) per week. Moderation of alcohol intake to this level decreases your risk of breast cancer and liver damage. And of course, no recreational drugs are part of a healthy lifestyle.  And absolutely no smoking or even second hand smoke. Most people know smoking can cause heart and lung diseases, but did you know it also contributes to weakening of your bones? Aging of your skin?  Yellowing of your teeth and nails?  CALCIUM AND VITAMIN D:  Adequate intake of calcium and Vitamin D are recommended.  The recommendations for exact amounts of these supplements seem to change often, but generally speaking 600 mg of calcium (either carbonate or citrate) and 800 units of Vitamin D per day seems prudent. Certain women may benefit from higher intake of Vitamin D.  If you are among these women, your doctor will have told you during your visit.    PAP SMEARS:  Pap  smears, to check for cervical cancer or precancers,  have traditionally been done yearly, although recent scientific advances have shown that most women can have pap smears less often.  However, every woman still should have a  physical exam from her gynecologist every year. It will include a breast check, inspection of the vulva and vagina to check for abnormal growths or skin changes, a visual exam of the cervix, and then an exam to evaluate the size and shape of the uterus and ovaries.  And after 62 years of age, a rectal exam is indicated to check for rectal cancers. We will also provide age appropriate advice regarding health maintenance, like when you should have certain vaccines, screening for sexually transmitted diseases, bone density testing, colonoscopy, mammograms, etc.   MAMMOGRAMS:  All women over 64 years old should have a yearly mammogram. Many facilities now offer a "3D" mammogram, which may cost around $50 extra out of pocket. If possible,  we recommend you accept the option to have the 3D mammogram performed.  It both reduces the number of women who will be called back for extra views which then turn out to be normal, and it is better than the routine mammogram at detecting truly abnormal areas.    COLONOSCOPY:  Colonoscopy to screen for colon cancer is recommended for all women at age 64.  We know, you hate the idea of the prep.  We agree, BUT, having colon cancer and not knowing it is worse!!  Colon cancer so often starts as a polyp that can be seen and removed at colonscopy, which can quite literally save your life!  And if your first colonoscopy is normal and you have no family history of colon cancer, most women don't have to have it again for 10 years.  Once every ten years, you can do something that may end up saving your life, right?  We will be happy to help you get it scheduled when you are ready.  Be sure to check your insurance coverage so you understand how much it will cost.  It may be covered as a preventative service at no cost, but you should check your particular policy.

## 2013-05-11 LAB — IPS PAP TEST WITH HPV

## 2013-06-22 ENCOUNTER — Ambulatory Visit
Admission: RE | Admit: 2013-06-22 | Discharge: 2013-06-22 | Disposition: A | Discharge status: Home / Self Care | Payer: BC Managed Care – PPO | Source: Ambulatory Visit | Attending: Obstetrics and Gynecology | Admitting: Obstetrics and Gynecology

## 2013-06-22 DIAGNOSIS — N6310 Unspecified lump in the right breast, unspecified quadrant: Secondary | ICD-10-CM

## 2013-06-22 DIAGNOSIS — N6320 Unspecified lump in the left breast, unspecified quadrant: Secondary | ICD-10-CM

## 2013-06-22 DIAGNOSIS — M8580 Other specified disorders of bone density and structure, unspecified site: Secondary | ICD-10-CM

## 2013-06-25 ENCOUNTER — Telehealth: Payer: Self-pay | Admitting: Emergency Medicine

## 2013-06-25 NOTE — Telephone Encounter (Signed)
Message left to return call to Hykeem Ojeda at [phone].    

## 2013-06-25 NOTE — Telephone Encounter (Signed)
Message copied by Michele Mcalpine on Mon Jun 25, 2013 10:36 AM ------      Message from: Jerome, BROOK E      Created: Sun Jun 24, 2013 10:44 AM       Please report bone density result showing osteopenia of the hip and spine to patient. Hip is more affected than spine.      Recommendations:  Calcium and vitamin daily, weight bearing exercise, and recheck bone density in 2 years.            Please note that patient also had a recent diagnostic mammogram and breast ultrasound which were negative.      She has a strong family history of breast cancer, and annual MRI of the breasts is an option, if she would like to proceed in March 2015.      Her breast MRI in march 2014 was negative. ------

## 2013-06-25 NOTE — Telephone Encounter (Signed)
Message copied by Ryanne Morand L on Mon Jun 25, 2013 10:53 AM ------      Message from: AMUNDSON DE CARVALHO E SILVA, BROOK E      Created: Sun Jun 24, 2013 10:46 AM       These are the patient's diagnostic mammogram and breast ultrasound reports, which were normal.       As in the other result note to you, I will offer the patient the opportunity for yearly breast MRI if she would like. ------ 

## 2013-06-25 NOTE — Telephone Encounter (Signed)
Message copied by Michele Mcalpine on Mon Jun 25, 2013 10:53 AM ------      Message from: [place], BROOK E      Created: Sun Jun 24, 2013 10:46 AM       These are the patient's diagnostic mammogram and breast ultrasound reports, which were normal.       As in the other result note to you, I will offer the patient the opportunity for yearly breast MRI if she would like. ------

## 2013-06-26 NOTE — Telephone Encounter (Signed)
Returning a call to Tracy °

## 2013-06-26 NOTE — Telephone Encounter (Signed)
Spoke with pt to advise on BMD results. Advised on recommendations. Pt aware of MMG and [place] result and that she can opt for yearly breast MRI, in March, if she desires. Pt agreeable.

## 2013-07-27 ENCOUNTER — Telehealth: Payer: Self-pay | Admitting: Obstetrics and Gynecology

## 2013-07-27 NOTE — Telephone Encounter (Signed)
Pt cancel appt for 3mth med reck states she will call back at a later time to reschedule °

## 2013-07-27 NOTE — Telephone Encounter (Signed)
Pt cancel appt for 46mth med reck states she will call back at a later time to reschedule

## 2013-07-27 NOTE — Telephone Encounter (Signed)
Thank you. I will close this encounter.

## 2013-08-10 ENCOUNTER — Ambulatory Visit: Payer: BC Managed Care – PPO | Admitting: Obstetrics and Gynecology

## 2013-08-13 ENCOUNTER — Telehealth: Payer: Self-pay | Admitting: Emergency Medicine

## 2013-08-13 DIAGNOSIS — N6315 Unspecified lump in the right breast, overlapping quadrants: Secondary | ICD-10-CM

## 2013-08-13 DIAGNOSIS — N6310 Unspecified lump in the right breast, unspecified quadrant: Secondary | ICD-10-CM

## 2013-08-13 DIAGNOSIS — Z803 Family history of malignant neoplasm of breast: Secondary | ICD-10-CM

## 2013-08-13 DIAGNOSIS — N6321 Unspecified lump in the left breast, upper outer quadrant: Secondary | ICD-10-CM

## 2013-08-13 NOTE — Telephone Encounter (Signed)
Called patient to attempt to find out if she would like to go forward with scheduling Breast MRI, although, not due until March so may need to wait.

## 2013-11-08 NOTE — Telephone Encounter (Signed)
Ordered Breast MRI, bilateral with and wo contrast. Due after 11/17/13.  Yearly Breast MRI, Strong family hx of breast CA, Last Mammogram 06/24/2013, q 6 month imaging with mammogram and  MRI. Strong family history of breast cancer (2 sisters with breast cancer premenopausally) with negative genetic testing, Per patient: Sister with Thyroid CA, Sister with non Hodgkins lymphoma, leukemia, Brother and Father with Prostate Ca.

## 2013-11-09 NOTE — Telephone Encounter (Signed)
FAMILY HISTORY:  We obtained a detailed, 4-generation family history. Significant diagnoses are listed below:  Family History   Problem  Relation  Age of Onset   .  Breast cancer  Sister  50   .  Prostate cancer  Father  34   .  Breast cancer  Maternal Aunt  81   .  Prostate cancer  Paternal Uncle    .  Throat cancer  Paternal Grandfather       pipe smoker    .  Breast cancer  Sister  98   .  Lymphoma  Sister  89   .  Leukemia  Sister  58      as a result of chemotherapy from her lymphoma    .  Pancreatic cancer  Maternal Aunt  65   .  Colon cancer  Maternal Aunt       diagnosed in her 35s    .  Thyroid cancer  Maternal Aunt       dx in her 19s    .  Leukemia  Maternal Aunt       diagnosed in her 33s    .  Breast cancer  Cousin       diagnosed in her 14s    .  Prostate cancer  Paternal Uncle

## 2013-11-09 NOTE — Telephone Encounter (Signed)
Attempted precert of Bilateral Breast MRI with and wo contrast. They are unable to approve with V code of family hx of breast cancer. Discussed with Dr. Quincy Simmonds, will change dx to bilateral breast lump, Small 1 cm ridge like lump at 2 o'clock of left breast and 9 o'clock of the right breast.  New order placed. Will attempt precert again with Felipa Emory.

## 2013-11-15 ENCOUNTER — Telehealth: Payer: Self-pay | Admitting: Emergency Medicine

## 2013-11-15 NOTE — Telephone Encounter (Signed)
Spoke with [name], Nurse Reviewer at [facility].  Obtained authorization for bilateral breast MRI w wo contrast at [place].  Authorization Number: [id number] until 11/29/13.   Will notify patient. Message left to return call to [place] at [phone].   To obain hx from patient at return call.  Claustrophobia:  High Blood pressure or Diabetes: hx of HTN.  Kidney or liver disease: Allergy to IV contrast: Insurance Type: BCBS Location:

## 2013-11-15 NOTE — Telephone Encounter (Addendum)
Jessica Chafe, RN at 11/15/2013 2:09 PM     Status: Signed        Spoke with [name], Nurse Reviewer at [facility].  Obtained authorization for bilateral breast MRI w wo contrast at [place].  Authorization Number: [id number] until 11/29/13.  Will notify patient. Message left to return call to [place] at [phone].  To obain hx from patient at return call.  Claustrophobia: Yes, requires "larger machine". Discussed with [facility]. High Blood pressure or Diabetes: hx of HTN.  Kidney or liver disease: No Allergy to IV contrast: No Insurance Type: BCBS  Location: [place]. They will draw bun/creatinine prior.

## 2013-11-16 ENCOUNTER — Telehealth: Payer: Self-pay | Admitting: Emergency Medicine

## 2013-11-16 NOTE — Telephone Encounter (Signed)
Encounter opened erroneously.   Closed encounter.   

## 2013-11-16 NOTE — Telephone Encounter (Signed)
Incoming call from patient. She is agreeable to scheduling Breast MRI.   Scheduled for 11/28/13 at 1045. Patient should come at 1000 to register and to complete blood work, needs bun/creatinine due to hx of HTN. Scheduled MRI advised will need larger MRI due to hx of claustrophobia.   Patient is agreeable to instructions and will call back with any concerns.

## 2013-11-20 NOTE — Telephone Encounter (Signed)
Noted that patient has cancelled her appointment at [facility] as of this morning. Per notes from [facility] she requested cancellation. Advised cost to patient was $404.00, but that they could not tell via Epic why patient cancelled.

## 2013-11-28 ENCOUNTER — Other Ambulatory Visit: Payer: BC Managed Care – PPO

## 2013-11-29 ENCOUNTER — Ambulatory Visit
Admission: RE | Admit: 2013-11-29 | Discharge: 2013-11-29 | Disposition: A | Discharge status: Home / Self Care | Payer: BC Managed Care – PPO | Source: Ambulatory Visit | Attending: Obstetrics and Gynecology | Admitting: Obstetrics and Gynecology

## 2013-11-29 DIAGNOSIS — N6315 Unspecified lump in the right breast, overlapping quadrants: Secondary | ICD-10-CM

## 2013-11-29 DIAGNOSIS — N6310 Unspecified lump in the right breast, unspecified quadrant: Secondary | ICD-10-CM

## 2013-11-29 DIAGNOSIS — Z803 Family history of malignant neoplasm of breast: Secondary | ICD-10-CM

## 2013-11-29 DIAGNOSIS — N6321 Unspecified lump in the left breast, upper outer quadrant: Secondary | ICD-10-CM

## 2013-11-29 MED ORDER — GADOBENATE DIMEGLUMINE 529 MG/ML IV SOLN
15.0000 mL | Freq: Once | INTRAVENOUS | Status: AC | PRN
  Administered 2013-11-29: 15 mL via INTRAVENOUS

## 2013-12-04 ENCOUNTER — Telehealth: Payer: Self-pay | Admitting: Obstetrics and Gynecology

## 2013-12-04 NOTE — Telephone Encounter (Signed)
Message copied by Michele Mcalpine on Tue Dec 04, 2013  3:45 PM ------      Message from: [employer], BROOK E      Created: Sun Dec 02, 2013  4:59 PM       Please report negative breast MRI to patient.      You may remove her from any hold or list you have for follow up.            Thanks! ------

## 2013-12-04 NOTE — Telephone Encounter (Signed)
Patient is calling for recent breast MRI results. Patient had this done last Thursday 11/29/13 and no one has called her with results.

## 2013-12-04 NOTE — Telephone Encounter (Signed)
Patient notified of message from Dr. Quincy Simmonds. Confirmed her next annual exam with Dr. Quincy Simmonds as scheduled in November.  Routing to provider for final review. Patient agreeable to disposition. Will close encounter

## 2013-12-17 NOTE — Telephone Encounter (Signed)
Notes Recorded by Karen Chafe, RN on 12/04/2013 at 3:49 PM Patient aware of results. Telephone encounter created.    ------  Notes Recorded by Odelia Gage, MD on 12/02/2013 at 4:59 PM Please report negative breast MRI to patient. You may remove her from any hold or list you have for follow up.  Thanks!

## 2013-12-25 DIAGNOSIS — Z8639 Personal history of other endocrine, nutritional and metabolic disease: Secondary | ICD-10-CM | POA: Insufficient documentation

## 2013-12-25 DIAGNOSIS — I10 Essential (primary) hypertension: Secondary | ICD-10-CM | POA: Insufficient documentation

## 2014-01-08 DIAGNOSIS — E042 Nontoxic multinodular goiter: Secondary | ICD-10-CM | POA: Insufficient documentation

## 2014-01-22 ENCOUNTER — Telehealth: Payer: Self-pay | Admitting: Obstetrics and Gynecology

## 2014-01-22 NOTE — Telephone Encounter (Signed)
Patient received a bill for services 14 months ago. Patient is curious why so much time has passed. Patient will pay just want further explanation.

## 2014-01-24 NOTE — Telephone Encounter (Signed)
Returned patient's call. Left a voice mail message to call me back.

## 2014-01-25 NOTE — Telephone Encounter (Signed)
Patient returning Jessica's call

## 2014-01-28 NOTE — Telephone Encounter (Signed)
Patient is returning jessica's call

## 2014-02-26 ENCOUNTER — Encounter: Payer: Self-pay | Admitting: Obstetrics and Gynecology

## 2014-03-04 ENCOUNTER — Other Ambulatory Visit: Payer: Self-pay | Admitting: Endocrinology

## 2014-03-04 DIAGNOSIS — E049 Nontoxic goiter, unspecified: Secondary | ICD-10-CM

## 2014-03-08 ENCOUNTER — Ambulatory Visit
Admission: RE | Admit: 2014-03-08 | Discharge: 2014-03-08 | Disposition: A | Discharge status: Home / Self Care | Payer: BC Managed Care – PPO | Source: Ambulatory Visit | Attending: Endocrinology | Admitting: Endocrinology

## 2014-03-08 DIAGNOSIS — E049 Nontoxic goiter, unspecified: Secondary | ICD-10-CM

## 2014-04-22 ENCOUNTER — Encounter: Payer: Self-pay | Admitting: Obstetrics and Gynecology

## 2014-05-06 ENCOUNTER — Telehealth: Payer: Self-pay | Admitting: Gynecology

## 2014-05-06 NOTE — Telephone Encounter (Signed)
Left message upcoming appointment has been canceled and needs to be rescheduled. °

## 2014-05-10 ENCOUNTER — Ambulatory Visit: Payer: BC Managed Care – PPO | Admitting: Obstetrics and Gynecology

## 2014-05-13 ENCOUNTER — Ambulatory Visit: Payer: BC Managed Care – PPO | Admitting: Gynecology

## 2014-05-14 ENCOUNTER — Encounter: Payer: Self-pay | Admitting: Certified Nurse Midwife

## 2014-05-14 ENCOUNTER — Ambulatory Visit (INDEPENDENT_AMBULATORY_CARE_PROVIDER_SITE_OTHER): Payer: BC Managed Care – PPO | Admitting: Certified Nurse Midwife

## 2014-05-14 DIAGNOSIS — Z0000 Encounter for general adult medical examination without abnormal findings: Secondary | ICD-10-CM

## 2014-05-14 DIAGNOSIS — Z124 Encounter for screening for malignant neoplasm of cervix: Secondary | ICD-10-CM

## 2014-05-14 DIAGNOSIS — Z01419 Encounter for gynecological examination (general) (routine) without abnormal findings: Secondary | ICD-10-CM

## 2014-05-14 LAB — POCT URINALYSIS DIPSTICK
Urobilinogen, UA: NEGATIVE
pH, UA: 5

## 2014-05-14 NOTE — Progress Notes (Signed)
63 y.o. G60P3003 Married Caucasian Fe here for annual exam.  Menopausal no HRT. Denies vaginal bleeding or vaginal dryness. Sees PCP for hypertension management. Sees Bryon Lions for alternative therapy with Progesterone cream and progesterone?estrio and Minivelle.0375 mg. Prefers her management with blood work and saliva testing.   Patient's last menstrual period was 06/21/1998.          Sexually active: Yes.    The current method of family planning is post menopausal status.    Exercising: Yes.    Yoga, Walking  Smoker:  no  Health Maintenance: Pap: 05/09/13 NEG HR HPV not detected MMG:  06/22/13 Bi-Rads 1: Negative;  06/22/13 Bilateral Breast Ultrasound Bi-Rads 1; 11/29/13 Bilateral Breast MRI Bi-Rads 1: Neg results all normal Colonoscopy:   3 years ago- Normal f/u in  BMD:   06/22/13  TDaP:  2014  Labs: Vicenta Aly, MD;  Urine: Leuks ++   reports that she has never smoked. She has never used smokeless tobacco. She reports that she does not drink alcohol or use illicit drugs.  Past Medical History  Diagnosis Date  . Hypertension   . GERD (gastroesophageal reflux disease)   . Vitamin D deficiency   . Arthritis   . Thyroid disease     hyperparathyroidism  . Hx of scarlet fever     as achild    Past Surgical History  Procedure Laterality Date  . Cholecystectomy  1999  . Parathyroidectomy Right 2010    Current Outpatient Prescriptions  Medication Sig Dispense Refill  . aspirin 81 MG tablet Take 81 mg by mouth daily.    . calcium carbonate (OS-CAL) 600 MG TABS Take 600 mg by mouth 2 (two) times daily with a meal.    . cetirizine (ZYRTEC) 10 MG tablet Take 10 mg by mouth daily.    . Cholecalciferol (VITAMIN D PO) Take 1,000 mg by mouth daily.     . hydrochlorothiazide (HYDRODIURIL) 12.5 MG tablet Take 12.5 mg by mouth daily.    [name] omega-3 acid ethyl esters (LOVAZA) 1 G capsule Take 2 g by mouth 2 (two) times daily.    [name] PARoxetine Mesylate (BRISDELLE) 7.5 MG CAPS Take 7.5 mg by  mouth every morning. 30 capsule 11   No current facility-administered medications for this visit.    Family History  Problem Relation Age of Onset  . Breast cancer Sister 72  . Prostate cancer Father 22  . Breast cancer Maternal Aunt 81  . Prostate cancer Paternal Uncle   . Throat cancer Paternal Grandfather     pipe smoker  . Breast cancer Sister 31  . Lymphoma Sister 60  . Leukemia Sister 45    as a result of chemotherapy from her lymphoma  . Cancer Sister 55    non hodgins lymphoma/ leukemia  . Pancreatic cancer Maternal Aunt 65  . Colon cancer Maternal Aunt     diagnosed in her 68s  . Thyroid cancer Maternal Aunt     dx in her 84s  . Leukemia Maternal Aunt     diagnosed in her 27s  . Breast cancer Cousin     diagnosed in her 30s  . Prostate cancer Paternal Uncle     ROS:  Pertinent items are noted in HPI.  Otherwise, a comprehensive ROS was negative.  Exam:   LMP 06/21/1998    Ht Readings from Last 3 Encounters:  05/09/13 5\' 5"  (1.651 m)  02/16/13 5' 4.5" (1.638 m)  05/09/12 5' 4.5" (1.638 m)  General appearance: alert, cooperative and appears stated age Head: Normocephalic, without obvious abnormality, atraumatic Neck: no adenopathy, supple, symmetrical, trachea midline and thyroid normal to inspection and palpation Lungs: clear to auscultation bilaterally Breasts: normal appearance, no masses or tenderness, No nipple retraction or dimpling, No nipple discharge or bleeding, No axillary or supraclavicular adenopathy Heart: regular rate and rhythm Abdomen: soft, non-tender; no masses,  no organomegaly Extremities: extremities normal, atraumatic, no cyanosis or edema Skin: Skin color, texture, turgor normal. No rashes or lesions Lymph nodes: Cervical, supraclavicular, and axillary nodes normal. No abnormal inguinal nodes palpated Neurologic: Grossly normal   Pelvic: External genitalia:  no lesions              Urethra:  normal appearing urethra with no  masses, tenderness or lesions              Bartholin's and Skene's: normal                 Vagina: normal appearing vagina with normal color and discharge, no lesions              Cervix: normal, non tender, small cervical polyp noted at 12-1 o'clock, bleeding with pap only              Pap taken: Yes.   Bimanual Exam:  Uterus:  normal size, contour, position, consistency, mobility, non-tender and mid position              Adnexa: normal adnexa and no mass, fullness, tenderness               Rectovaginal: Confirms               Anus:  normal sphincter tone, no lesions  A:  Well Woman with normal exam  Menopausal on HRT with Dr.Sanders  Cervical polyp  Hypertension with PCP management  P:   Reviewed health and wellness pertinent to exam  Aware of need to evaluate if vaginal bleeding  Discussed benign finding and can be removed if needed.Patient will advise if changes  Pap smear taken today with HPV reflex   counseled on breast self exam, mammography screening, menopause, adequate intake of calcium and vitamin D, diet and exercise  return annually or prn  An After Visit Summary was printed and given to the patient.

## 2014-05-14 NOTE — Patient Instructions (Signed)

## 2014-05-15 NOTE — Progress Notes (Signed)
Reviewed personally.  M. Suzanne Fabianna Keats, MD.  

## 2014-05-17 LAB — IPS PAP TEST WITH REFLEX TO HPV

## 2014-05-29 ENCOUNTER — Other Ambulatory Visit: Payer: Self-pay

## 2014-05-29 DIAGNOSIS — Z1231 Encounter for screening mammogram for malignant neoplasm of breast: Secondary | ICD-10-CM

## 2014-06-24 ENCOUNTER — Ambulatory Visit
Admission: RE | Admit: 2014-06-24 | Discharge: 2014-06-24 | Disposition: A | Discharge status: Home / Self Care | Payer: BC Managed Care – PPO | Source: Ambulatory Visit

## 2014-06-24 DIAGNOSIS — Z1231 Encounter for screening mammogram for malignant neoplasm of breast: Secondary | ICD-10-CM

## 2014-06-25 DIAGNOSIS — R229 Localized swelling, mass and lump, unspecified: Secondary | ICD-10-CM | POA: Insufficient documentation

## 2015-04-24 ENCOUNTER — Ambulatory Visit: Payer: BC Managed Care – PPO | Admitting: Certified Nurse Midwife

## 2015-05-26 ENCOUNTER — Ambulatory Visit: Payer: BC Managed Care – PPO | Admitting: Certified Nurse Midwife

## 2015-05-27 ENCOUNTER — Other Ambulatory Visit: Payer: Self-pay

## 2015-05-27 DIAGNOSIS — Z1231 Encounter for screening mammogram for malignant neoplasm of breast: Secondary | ICD-10-CM

## 2015-06-04 ENCOUNTER — Ambulatory Visit (INDEPENDENT_AMBULATORY_CARE_PROVIDER_SITE_OTHER): Payer: BLUE CROSS/BLUE SHIELD | Admitting: Certified Nurse Midwife

## 2015-06-04 ENCOUNTER — Telehealth: Payer: Self-pay | Admitting: Certified Nurse Midwife

## 2015-06-04 ENCOUNTER — Encounter: Payer: Self-pay | Admitting: Certified Nurse Midwife

## 2015-06-04 VITALS — BP 114/70 | HR 86 | Resp 16 | Ht 65.0 in | Wt 161.0 lb

## 2015-06-04 DIAGNOSIS — Z01419 Encounter for gynecological examination (general) (routine) without abnormal findings: Secondary | ICD-10-CM

## 2015-06-04 DIAGNOSIS — Z124 Encounter for screening for malignant neoplasm of cervix: Secondary | ICD-10-CM | POA: Diagnosis not present

## 2015-06-04 DIAGNOSIS — N951 Menopausal and female climacteric states: Secondary | ICD-10-CM | POA: Diagnosis not present

## 2015-06-04 DIAGNOSIS — N841 Polyp of cervix uteri: Secondary | ICD-10-CM | POA: Diagnosis not present

## 2015-06-04 DIAGNOSIS — M8580 Other specified disorders of bone density and structure, unspecified site: Secondary | ICD-10-CM

## 2015-06-04 MED ORDER — PROGESTERONE MICRONIZED 100 MG PO CAPS: 100.0000 mg | ORAL_CAPSULE | Freq: Every day | ORAL | Status: DC

## 2015-06-04 MED ORDER — ESTRADIOL 0.025 MG/24HR TD PTTW: 1.0000 | MEDICATED_PATCH | TRANSDERMAL | Status: DC

## 2015-06-04 NOTE — Patient Instructions (Signed)

## 2015-06-04 NOTE — Telephone Encounter (Signed)
Patient was seen today for AEX, patient says [facility] needs a order in for patient's bone density. Best # to reach for any questions: [phone]

## 2015-06-04 NOTE — Progress Notes (Signed)
64 y.o. G35P3003 Married Caucasian Fe here for annual exam. Menopausal on HRT. Has last source of progesterone cream to use today with her minivelle dot. Requests management here for HRT now. Denies vaginal bleeding or vaginal dryness. Sees Dr. Ouida Sills every 6 months now for new problem of cholesterol management/hypertension management/labs. All stable labs and medication.  Patient's sister died from complications of a fall, this year, emotionally doing OK. No other health issues today.  Patient's last menstrual period was 06/21/1998.          Sexually active: Yes.    The current method of family planning is post menopausal status.    Exercising: Yes.    Yoga and walking Smoker:  yes  Health Maintenance: Pap:  05/14/2014 Negative MMG:  06/2014 BIRADS 1 Colonoscopy:  2012 Normal BMD:   06/2013 normal per patient TDaP:  Within the past 10 years as per patient Shingles: Approx 2 years ago as per patient Pneumonia: Approx 2 years ago as per patient. Hep C and HIV: declined Labs:PCP   reports that she has never smoked. She has never used smokeless tobacco. She reports that she does not drink alcohol or use illicit drugs.  Past Medical History  Diagnosis Date  . Hypertension   . GERD (gastroesophageal reflux disease)   . Vitamin D deficiency   . Arthritis   . Thyroid disease     hyperparathyroidism  . Hx of scarlet fever     as achild    Past Surgical History  Procedure Laterality Date  . Cholecystectomy  1999  . Parathyroidectomy Right 2010    Current Outpatient Prescriptions  Medication Sig Dispense Refill  . aspirin 81 MG tablet Take 81 mg by mouth daily.    [name] atorvastatin (LIPITOR) 20 MG tablet TAKE 1 TABLET BY MOUTH EVERY DAY    . calcium carbonate (OS-CAL) 600 MG TABS Take 600 mg by mouth 2 (two) times daily with a meal.    . cetirizine (ZYRTEC) 10 MG tablet Take 10 mg by mouth daily.    . Cholecalciferol (VITAMIN D PO) Take 1,000 mg by mouth daily.     .  hydrochlorothiazide (HYDRODIURIL) 12.5 MG tablet Take 12.5 mg by mouth daily.    [name] omega-3 acid ethyl esters (LOVAZA) 1 G capsule Take 2 g by mouth 2 (two) times daily.    [name] estradiol (MINIVELLE) 0.025 MG/24HR Place 1 patch onto the skin.    . Magnesium 250 MG TABS Take by mouth.    . Omega-3 Fatty Acids (FISH OIL) 1000 MG CAPS Take 1 capsule by mouth.     No current facility-administered medications for this visit.    Family History  Problem Relation Age of Onset  . Breast cancer Sister 64  . Prostate cancer Father 46  . Breast cancer Maternal Aunt 81  . Prostate cancer Paternal Uncle   . Throat cancer Paternal Grandfather     pipe smoker  . Breast cancer Sister 71  . Lymphoma Sister 3  . Leukemia Sister 35    as a result of chemotherapy from her lymphoma  . Cancer Sister 16    non hodgins lymphoma/ leukemia  . Pancreatic cancer Maternal Aunt 65  . Colon cancer Maternal Aunt     diagnosed in her 37s  . Thyroid cancer Maternal Aunt     dx in her 54s  . Leukemia Maternal Aunt     diagnosed in her 59s  . Breast cancer Cousin     diagnosed in  her 38s  . Prostate cancer Paternal Uncle     ROS:  Pertinent items are noted in HPI.  Otherwise, a comprehensive ROS was negative.  Exam:   BP 114/70 mmHg  Pulse 86  Resp 16  Ht 5\' 5"  (1.651 m)  Wt 161 lb (73.029 kg)  BMI 26.79 kg/m2  LMP 06/21/1998 Height: 5\' 5"  (165.1 cm) Ht Readings from Last 3 Encounters:  06/04/15 5\' 5"  (1.651 m)  05/09/13 5\' 5"  (1.651 m)  02/16/13 5' 4.5" (1.638 m)    General appearance: alert, cooperative and appears stated age Head: Normocephalic, without obvious abnormality, atraumatic Neck: no adenopathy, supple, symmetrical, trachea midline and thyroid normal to inspection and palpation Lungs: clear to auscultation bilaterally Breasts: normal appearance, no masses or tenderness, No nipple retraction or dimpling, No nipple discharge or bleeding, No axillary or supraclavicular adenopathy Heart:  regular rate and rhythm Abdomen: soft, non-tender; no masses,  no organomegaly Extremities: extremities normal, atraumatic, no cyanosis or edema Skin: Skin color, texture, turgor normal. No rashes or lesions Lymph nodes: Cervical, supraclavicular, and axillary nodes normal. No abnormal inguinal nodes palpated Neurologic: Grossly normal   Pelvic: External genitalia:  no lesions              Urethra:  normal appearing urethra with no masses, tenderness or lesions              Bartholin's and Skene's: normal                 Vagina: normal appearing vagina with normal color and discharge, no lesions              Cervix: normal appearance with cervical polyp small noted in os, bleeding with pap only              Pap taken: Yes.   Bimanual Exam:  Uterus:  normal size, contour, position, consistency, mobility, non-tender              Adnexa: normal adnexa and no mass, fullness, tenderness               Rectovaginal: Confirms               Anus:  normal sphincter tone, no lesions  Chaperone present: yes  A:  Well Woman with normal exam  Menopausal on HRT desires continuance and management of progesterone here( was receiving from alternative source)  Cervical polyp New diagnosis of Hypertension/cholesterol management with PCP  P:   Reviewed health and wellness pertinent to exam  Discussed need to advise if vaginal bleeding.  Discussed [facility] information regarding length time of use of HRT. Feel with new diagnosis of hypertension and cholesterol changes, need to wean off her HRT. Discussed expectations with some hot flashes which should subside and alternatives to manage. Questions addressed. Patient agreeable.   Rx Minivelle 0/0.25 mg see order, patient to use twice weekly for 2 weeks and then once weekly for 2 weeks then 1/2 once weekly for 2 weeks and stop. Continue Progesterone until stops Minivelle and stop together. Voiced understanding and will advise when stops.  Rx Prometrium 100 mg see  order  Discussed finding of cervical polyp and etiology. Benign finding and recommendation to remove if changing or increasing in size or having bleeding. Questions addressed. Patient will call if any bleeding. Continue MD follow up as indicated  Pap smear as above taken with HPVHR   counseled on breast self exam, mammography screening, use and side effects of HRT,  adequate intake of calcium and vitamin D, diet and exercise, Kegel's exercises  return annually or prn  An After Visit Summary was printed and given to the patient.

## 2015-06-04 NOTE — Telephone Encounter (Signed)
Order for BMD placed via EPIC to the [facility]. Patient is due after 06/23/2015. Patient notified. Will call to schedule BMD appointment.  Routing to provider for final review. Patient agreeable to disposition. Will close encounter.

## 2015-06-06 LAB — IPS PAP TEST WITH HPV

## 2015-06-07 NOTE — Progress Notes (Signed)
Reviewed personally.  M. Suzanne Olly Shiner, MD.  

## 2015-06-26 ENCOUNTER — Ambulatory Visit: Payer: Self-pay

## 2015-07-08 ENCOUNTER — Ambulatory Visit
Admission: RE | Admit: 2015-07-08 | Discharge: 2015-07-08 | Disposition: A | Discharge status: Home / Self Care | Payer: BLUE CROSS/BLUE SHIELD | Source: Ambulatory Visit | Attending: Certified Nurse Midwife | Admitting: Certified Nurse Midwife

## 2015-07-08 ENCOUNTER — Ambulatory Visit
Admission: RE | Admit: 2015-07-08 | Discharge: 2015-07-08 | Disposition: A | Discharge status: Home / Self Care | Payer: BLUE CROSS/BLUE SHIELD | Source: Ambulatory Visit

## 2015-07-08 DIAGNOSIS — Z1231 Encounter for screening mammogram for malignant neoplasm of breast: Secondary | ICD-10-CM

## 2015-07-08 DIAGNOSIS — M8580 Other specified disorders of bone density and structure, unspecified site: Secondary | ICD-10-CM

## 2015-07-14 ENCOUNTER — Telehealth: Payer: Self-pay | Admitting: Certified Nurse Midwife

## 2015-07-14 NOTE — Telephone Encounter (Signed)
Spoke with patient. Patient states that she weaned off her patch as instructed by Melvia Heaps CNM at her [facility] on 06/04/2015. Patient has been off the patch for two weeks and is having increased hot flashes and difficulty sleeping. "I have always been one to have really bad hot flashes, but I can not handle this. I am not sleeping." Her hot flashes began to increase as she reduced her patch. She is exercising daily to help with help without relief. Advised I will speak with covering provider and return call with further recommendations. Patient is agreeable.

## 2015-07-14 NOTE — Telephone Encounter (Signed)
I will send note to DL for review -with her medical history and age-  agree with trying to wean her off.

## 2015-07-14 NOTE — Telephone Encounter (Signed)
Patient was taken off her estrogen patches and is having hot flashes and trouble sleeping at night. Is there anything else she can try?

## 2015-07-15 NOTE — Telephone Encounter (Signed)
Spoke with patient. Advised of message as seen below from [place]. Patient is agreeable and verbalizes understanding. She will contact the office with any further concerns or lack of symptom relief.  Routing to provider for final review. Patient agreeable to disposition. Will close encounter.

## 2015-07-15 NOTE — Telephone Encounter (Signed)
Please let patient know hot flashes are to be expected and will subside. She can try black cohosh which may help with hot flashes and help with the transition. Sleep with fan at bedside and light clothing usually helps. Increase water with lemon daily to increase hydration. Have her advise if problems.

## 2015-08-20 DIAGNOSIS — Z79899 Other long term (current) drug therapy: Secondary | ICD-10-CM | POA: Diagnosis not present

## 2015-08-20 DIAGNOSIS — N951 Menopausal and female climacteric states: Secondary | ICD-10-CM | POA: Diagnosis not present

## 2015-08-20 DIAGNOSIS — G4700 Insomnia, unspecified: Secondary | ICD-10-CM | POA: Diagnosis not present

## 2015-08-20 DIAGNOSIS — Z6828 Body mass index (BMI) 28.0-28.9, adult: Secondary | ICD-10-CM | POA: Diagnosis not present

## 2015-08-26 DIAGNOSIS — R5383 Other fatigue: Secondary | ICD-10-CM | POA: Diagnosis not present

## 2015-08-26 DIAGNOSIS — E782 Mixed hyperlipidemia: Secondary | ICD-10-CM | POA: Diagnosis not present

## 2015-08-26 DIAGNOSIS — R202 Paresthesia of skin: Secondary | ICD-10-CM | POA: Diagnosis not present

## 2015-08-26 DIAGNOSIS — R5381 Other malaise: Secondary | ICD-10-CM | POA: Diagnosis not present

## 2015-08-26 DIAGNOSIS — R748 Abnormal levels of other serum enzymes: Secondary | ICD-10-CM | POA: Diagnosis not present

## 2015-08-26 DIAGNOSIS — I10 Essential (primary) hypertension: Secondary | ICD-10-CM | POA: Diagnosis not present

## 2015-09-02 ENCOUNTER — Other Ambulatory Visit: Payer: Self-pay | Admitting: Nurse Practitioner

## 2015-09-02 DIAGNOSIS — R945 Abnormal results of liver function studies: Principal | ICD-10-CM

## 2015-09-02 DIAGNOSIS — R7989 Other specified abnormal findings of blood chemistry: Secondary | ICD-10-CM

## 2015-09-16 ENCOUNTER — Ambulatory Visit
Admission: RE | Admit: 2015-09-16 | Discharge: 2015-09-16 | Disposition: A | Discharge status: Home / Self Care | Payer: PPO | Source: Ambulatory Visit | Attending: Nurse Practitioner | Admitting: Nurse Practitioner

## 2015-09-16 DIAGNOSIS — N200 Calculus of kidney: Secondary | ICD-10-CM | POA: Diagnosis not present

## 2015-09-16 DIAGNOSIS — R7989 Other specified abnormal findings of blood chemistry: Secondary | ICD-10-CM

## 2015-09-16 DIAGNOSIS — R945 Abnormal results of liver function studies: Principal | ICD-10-CM

## 2015-09-18 DIAGNOSIS — Z79899 Other long term (current) drug therapy: Secondary | ICD-10-CM | POA: Diagnosis not present

## 2015-09-18 DIAGNOSIS — G4700 Insomnia, unspecified: Secondary | ICD-10-CM | POA: Diagnosis not present

## 2015-09-18 DIAGNOSIS — N951 Menopausal and female climacteric states: Secondary | ICD-10-CM | POA: Diagnosis not present

## 2015-09-18 DIAGNOSIS — I10 Essential (primary) hypertension: Secondary | ICD-10-CM | POA: Diagnosis not present

## 2015-09-25 ENCOUNTER — Other Ambulatory Visit: Payer: Self-pay | Admitting: Gastroenterology

## 2015-09-25 DIAGNOSIS — R945 Abnormal results of liver function studies: Principal | ICD-10-CM

## 2015-09-25 DIAGNOSIS — R748 Abnormal levels of other serum enzymes: Secondary | ICD-10-CM | POA: Diagnosis not present

## 2015-09-25 DIAGNOSIS — R7989 Other specified abnormal findings of blood chemistry: Secondary | ICD-10-CM

## 2015-09-25 DIAGNOSIS — Z7689 Persons encountering health services in other specified circumstances: Secondary | ICD-10-CM | POA: Diagnosis not present

## 2015-09-25 DIAGNOSIS — R933 Abnormal findings on diagnostic imaging of other parts of digestive tract: Secondary | ICD-10-CM | POA: Diagnosis not present

## 2015-10-08 ENCOUNTER — Ambulatory Visit
Admission: RE | Admit: 2015-10-08 | Discharge: 2015-10-08 | Disposition: A | Discharge status: Home / Self Care | Payer: PPO | Source: Ambulatory Visit | Attending: Gastroenterology | Admitting: Gastroenterology

## 2015-10-08 DIAGNOSIS — R945 Abnormal results of liver function studies: Principal | ICD-10-CM

## 2015-10-08 DIAGNOSIS — K838 Other specified diseases of biliary tract: Secondary | ICD-10-CM | POA: Diagnosis not present

## 2015-10-08 DIAGNOSIS — R7989 Other specified abnormal findings of blood chemistry: Secondary | ICD-10-CM

## 2015-10-08 DIAGNOSIS — R935 Abnormal findings on diagnostic imaging of other abdominal regions, including retroperitoneum: Secondary | ICD-10-CM | POA: Diagnosis not present

## 2015-10-08 MED ORDER — GADOBENATE DIMEGLUMINE 529 MG/ML IV SOLN
15.0000 mL | Freq: Once | INTRAVENOUS | Status: AC | PRN
  Administered 2015-10-08: 15 mL via INTRAVENOUS

## 2015-10-21 DIAGNOSIS — L237 Allergic contact dermatitis due to plants, except food: Secondary | ICD-10-CM | POA: Diagnosis not present

## 2015-11-03 DIAGNOSIS — G4700 Insomnia, unspecified: Secondary | ICD-10-CM | POA: Diagnosis not present

## 2015-11-03 DIAGNOSIS — N951 Menopausal and female climacteric states: Secondary | ICD-10-CM | POA: Diagnosis not present

## 2015-12-17 ENCOUNTER — Other Ambulatory Visit: Payer: Self-pay | Admitting: Nurse Practitioner

## 2015-12-17 DIAGNOSIS — M7989 Other specified soft tissue disorders: Secondary | ICD-10-CM | POA: Diagnosis not present

## 2015-12-17 DIAGNOSIS — M79602 Pain in left arm: Secondary | ICD-10-CM

## 2015-12-17 DIAGNOSIS — R222 Localized swelling, mass and lump, trunk: Secondary | ICD-10-CM | POA: Diagnosis not present

## 2015-12-17 DIAGNOSIS — M62838 Other muscle spasm: Secondary | ICD-10-CM | POA: Diagnosis not present

## 2015-12-18 ENCOUNTER — Ambulatory Visit
Admission: RE | Admit: 2015-12-18 | Discharge: 2015-12-18 | Disposition: A | Discharge status: Home / Self Care | Payer: PPO | Source: Ambulatory Visit | Attending: Nurse Practitioner | Admitting: Nurse Practitioner

## 2015-12-18 ENCOUNTER — Other Ambulatory Visit: Payer: Self-pay | Admitting: Nurse Practitioner

## 2015-12-18 DIAGNOSIS — M79602 Pain in left arm: Secondary | ICD-10-CM

## 2015-12-18 DIAGNOSIS — R222 Localized swelling, mass and lump, trunk: Secondary | ICD-10-CM

## 2015-12-18 DIAGNOSIS — R2232 Localized swelling, mass and lump, left upper limb: Secondary | ICD-10-CM | POA: Diagnosis not present

## 2015-12-18 DIAGNOSIS — M25422 Effusion, left elbow: Secondary | ICD-10-CM

## 2015-12-18 DIAGNOSIS — M7989 Other specified soft tissue disorders: Secondary | ICD-10-CM | POA: Diagnosis not present

## 2015-12-18 DIAGNOSIS — R0602 Shortness of breath: Secondary | ICD-10-CM | POA: Diagnosis not present

## 2015-12-29 DIAGNOSIS — M71812 Other specified bursopathies, left shoulder: Secondary | ICD-10-CM | POA: Diagnosis not present

## 2015-12-29 DIAGNOSIS — M25412 Effusion, left shoulder: Secondary | ICD-10-CM | POA: Diagnosis not present

## 2015-12-29 DIAGNOSIS — M7582 Other shoulder lesions, left shoulder: Secondary | ICD-10-CM | POA: Diagnosis not present

## 2015-12-29 DIAGNOSIS — M19012 Primary osteoarthritis, left shoulder: Secondary | ICD-10-CM | POA: Diagnosis not present

## 2015-12-29 DIAGNOSIS — M65812 Other synovitis and tenosynovitis, left shoulder: Secondary | ICD-10-CM | POA: Diagnosis not present

## 2016-01-05 DIAGNOSIS — M67912 Unspecified disorder of synovium and tendon, left shoulder: Secondary | ICD-10-CM | POA: Diagnosis not present

## 2016-01-13 DIAGNOSIS — G4700 Insomnia, unspecified: Secondary | ICD-10-CM | POA: Diagnosis not present

## 2016-01-13 DIAGNOSIS — R945 Abnormal results of liver function studies: Secondary | ICD-10-CM | POA: Diagnosis not present

## 2016-01-13 DIAGNOSIS — N951 Menopausal and female climacteric states: Secondary | ICD-10-CM | POA: Diagnosis not present

## 2016-02-05 DIAGNOSIS — M67911 Unspecified disorder of synovium and tendon, right shoulder: Secondary | ICD-10-CM | POA: Diagnosis not present

## 2016-04-07 DIAGNOSIS — G4700 Insomnia, unspecified: Secondary | ICD-10-CM | POA: Diagnosis not present

## 2016-04-07 DIAGNOSIS — N951 Menopausal and female climacteric states: Secondary | ICD-10-CM | POA: Diagnosis not present

## 2016-04-07 DIAGNOSIS — Z79890 Hormone replacement therapy (postmenopausal): Secondary | ICD-10-CM | POA: Diagnosis not present

## 2016-04-07 DIAGNOSIS — I10 Essential (primary) hypertension: Secondary | ICD-10-CM | POA: Diagnosis not present

## 2016-05-12 ENCOUNTER — Telehealth: Payer: Self-pay | Admitting: Certified Nurse Midwife

## 2016-05-12 NOTE — Telephone Encounter (Signed)
Left message to reschedule cancelled appointment.

## 2016-05-26 DIAGNOSIS — Z0000 Encounter for general adult medical examination without abnormal findings: Secondary | ICD-10-CM | POA: Diagnosis not present

## 2016-05-26 DIAGNOSIS — E782 Mixed hyperlipidemia: Secondary | ICD-10-CM | POA: Diagnosis not present

## 2016-05-26 DIAGNOSIS — Z23 Encounter for immunization: Secondary | ICD-10-CM | POA: Diagnosis not present

## 2016-05-26 DIAGNOSIS — I10 Essential (primary) hypertension: Secondary | ICD-10-CM | POA: Diagnosis not present

## 2016-05-27 DIAGNOSIS — E782 Mixed hyperlipidemia: Secondary | ICD-10-CM | POA: Diagnosis not present

## 2016-05-27 DIAGNOSIS — E042 Nontoxic multinodular goiter: Secondary | ICD-10-CM | POA: Diagnosis not present

## 2016-05-27 DIAGNOSIS — I10 Essential (primary) hypertension: Secondary | ICD-10-CM | POA: Diagnosis not present

## 2016-06-03 ENCOUNTER — Other Ambulatory Visit: Payer: Self-pay | Admitting: Certified Nurse Midwife

## 2016-06-03 DIAGNOSIS — Z1231 Encounter for screening mammogram for malignant neoplasm of breast: Secondary | ICD-10-CM

## 2016-06-04 ENCOUNTER — Ambulatory Visit: Payer: BLUE CROSS/BLUE SHIELD | Admitting: Certified Nurse Midwife

## 2016-06-09 ENCOUNTER — Encounter: Payer: Self-pay | Admitting: Certified Nurse Midwife

## 2016-06-09 ENCOUNTER — Ambulatory Visit (INDEPENDENT_AMBULATORY_CARE_PROVIDER_SITE_OTHER): Payer: PPO | Admitting: Certified Nurse Midwife

## 2016-06-09 VITALS — BP 118/64 | HR 88 | Resp 16 | Ht 63.75 in | Wt 168.0 lb

## 2016-06-09 DIAGNOSIS — N6310 Unspecified lump in the right breast, unspecified quadrant: Secondary | ICD-10-CM | POA: Diagnosis not present

## 2016-06-09 DIAGNOSIS — Z124 Encounter for screening for malignant neoplasm of cervix: Secondary | ICD-10-CM | POA: Diagnosis not present

## 2016-06-09 DIAGNOSIS — Z01419 Encounter for gynecological examination (general) (routine) without abnormal findings: Secondary | ICD-10-CM | POA: Diagnosis not present

## 2016-06-09 DIAGNOSIS — N841 Polyp of cervix uteri: Secondary | ICD-10-CM

## 2016-06-09 NOTE — Progress Notes (Signed)
Spoke with Jessica Crosby at [facility]. Jessica Crosby scheduled for right breast diagnostic MMG with right breast ultrasound 06/18/16 at [place].   Spoke with Jessica Crosby, advised of appointment at [facility]. Jessica Crosby states she would like appointment when screening MMG is due so that MMG will be covered by insurance. Advised Jessica Crosby will return call to [facility] for recommendations and return call.  Spoke with L[place]ia at [facility]. Jessica Crosby scheduled for bilateral diagnostic MMG and right breast ultrasound 07/08/16 at 0900, arriving at [place].  Spoke with Jessica Crosby, advised of appointment at [facility]. Jessica Crosby verbalizes understanding and is agreeable to date and time.

## 2016-06-09 NOTE — Progress Notes (Signed)
65 y.o. G66P3003 Married  Caucasian Fe here for annual exam. Menopausal on HRT with Dr. Baird Cancer for Menopausal symptoms. Using Minivelle and progesterone cream and prometrium.  Patient sees PCP for Hypertension/cholesterol management. Denies vaginal bleeding or vaginal dryness.  Has all labs with PCP. Denies any problems today. "Have to pick up granddaughter today!"  Patient's last menstrual period was 06/21/1998.          Sexually active: Yes.    The current method of family planning is post menopausal status.    Exercising: Yes.    yoga, walking Smoker:  no  Health Maintenance: Pap:  06-04-15 neg HPV HR neg MMG:  07-08-15 category b density birads 1:neg  Colonoscopy:  2012 neg BMD:   2015 normal per patient TDaP:  Within past 10 Shingles: 64yrs ago Pneumonia: 3 yrs ago Hep C and HIV: declines Labs: PCP   reports that she has never smoked. She has never used smokeless tobacco. She reports that she does not drink alcohol or use drugs.  Past Medical History:  Diagnosis Date  . Arthritis   . GERD (gastroesophageal reflux disease)   . Hx of scarlet fever    as achild  . Hypertension   . Thyroid disease    hyperparathyroidism  . Vitamin D deficiency     Past Surgical History:  Procedure Laterality Date  . CHOLECYSTECTOMY  1999  . PARATHYROIDECTOMY Right 2010    Current Outpatient Prescriptions  Medication Sig Dispense Refill  . aspirin 81 MG tablet Take 81 mg by mouth daily.    [name] atorvastatin (LIPITOR) 20 MG tablet TAKE 1 TABLET BY MOUTH EVERY DAY    . calcium carbonate (OS-CAL) 600 MG TABS Take 600 mg by mouth 2 (two) times daily with a meal.    . cetirizine (ZYRTEC) 10 MG tablet Take 10 mg by mouth daily.    . Cholecalciferol (VITAMIN D PO) Take 1,000 mg by mouth daily.     [name] estradiol (MINIVELLE) 0.025 MG/24HR Place 1 patch onto the skin 2 (two) times a week. 8 patch 1  . hydrochlorothiazide (HYDRODIURIL) 12.5 MG tablet Take 12.5 mg by mouth daily.    [name]  losartan-hydrochlorothiazide (HYZAAR) 100-12.5 MG tablet Take 1 tablet by mouth daily.    . Magnesium 250 MG TABS Take by mouth.    . Multiple Vitamin (THERA) TABS Take 1 tablet by mouth daily.    [name] omega-3 acid ethyl esters (LOVAZA) 1 G capsule Take 2 g by mouth 2 (two) times daily.    . Omega-3 Fatty Acids (FISH OIL) 1000 MG CAPS Take 1 capsule by mouth.    . progesterone (PROMETRIUM) 100 MG capsule Take 1 capsule (100 mg total) by mouth daily. 30 capsule 1  . Progesterone 40 % CREA by Does not apply route.    . temazepam (RESTORIL) 15 MG capsule Take 1 capsule by mouth at bedtime.     No current facility-administered medications for this visit.     Family History  Problem Relation Age of Onset  . Breast cancer Sister 66  . Prostate cancer Father 35  . Breast cancer Maternal Aunt 81  . Prostate cancer Paternal Uncle   . Throat cancer Paternal Grandfather     pipe smoker  . Breast cancer Sister 3  . Lymphoma Sister 49  . Leukemia Sister 72    as a result of chemotherapy from her lymphoma  . Cancer Sister 48    non hodgins lymphoma/ leukemia  . Pancreatic cancer Maternal  Aunt 65  . Colon cancer Maternal Aunt     diagnosed in her 51s  . Thyroid cancer Maternal Aunt     dx in her 72s  . Leukemia Maternal Aunt     diagnosed in her 55s  . Breast cancer Cousin     diagnosed in her 97s  . Prostate cancer Paternal Uncle     ROS:  Pertinent items are noted in HPI.  Otherwise, a comprehensive ROS was negative.  Exam:   BP 118/64 (BP Location: Right Arm, Patient Position: Sitting, Cuff Size: Normal)   Pulse 88   Resp 16   Ht 5' 3.75" (1.619 m)   Wt 168 lb (76.2 kg)   LMP 06/21/1998   BMI 29.06 kg/m  Height: 5' 3.75" (161.9 cm) Ht Readings from Last 3 Encounters:  06/09/16 5' 3.75" (1.619 m)  06/04/15 5\' 5"  (1.651 m)  05/09/13 5\' 5"  (1.651 m)    General appearance: alert, cooperative and appears stated age Head: Normocephalic, without obvious abnormality,  atraumatic Neck: no adenopathy, supple, symmetrical, trachea midline and thyroid normal to inspection and palpation Lungs: clear to auscultation bilaterally Breasts: normal appearance, no masses or tenderness, No nipple retraction or dimpling, No nipple discharge or bleeding, No axillary or supraclavicular adenopathy. Right breast mass noted at 2-3 o'clock 3 fb out from areola, pea size, mobile Heart: regular rate and rhythm Abdomen: soft, non-tender; no masses,  no organomegaly Extremities: extremities normal, atraumatic, no cyanosis or edema Skin: Skin color, texture, turgor normal. No rashes or lesions Lymph nodes: Cervical, supraclavicular, and axillary nodes normal. No abnormal inguinal nodes palpated Neurologic: Grossly normal   Pelvic: External genitalia:  no lesions              Urethra:  normal appearing urethra with no masses, tenderness or lesions              Bartholin's and Skene's: normal                 Vagina: normal appearing vagina with normal color and discharge, no lesions              Cervix: multiparous appearance, no cervical motion tenderness and  small cervical polyp noted, bleeding with pap only              Pap taken: Yes.   Bimanual Exam:  Uterus:  normal size, contour, position, consistency, mobility, non-tender              Adnexa: normal adnexa and no mass, fullness, tenderness               Rectovaginal: Confirms               Anus:  normal sphincter tone, no lesions  Chaperone present: yes  A:  Well Woman with normal exam   Menopausal on HRT with Dr. Baird Cancer  Cholesterol/Hypertension management with PCP/T.Anderson.  Right breast mass  Cervical polyp(known)  P:   Reviewed health and wellness pertinent to exam  Aware of need to evaluate vaginal bleeding if occurs.  Discussed risks of HRT after 60, patient aware and feels good with this management.  Continue follow up with MD regarding hypertension/cholesterol  Discussed finding and patient palpated  and had not noted area on SBE. Had MM this year negative. Discussed need to evaluate with diagnostic mammogram and [place]. Agreeable. Patient will be called with appointment information( could not stay due to grandchild needing to be picked up)  Discussed cervical polyp  benign finding, if has spotting or bleeding needs to advise and can be removed. Prefers no removal unless necessary. Questions addressed.  Pap smear as above   counseled on breast self exam, mammography screening, adequate intake of calcium and vitamin D, diet and exercise  return annually or prn  An After Visit Summary was printed and given to the patient.

## 2016-06-09 NOTE — Patient Instructions (Signed)

## 2016-06-10 LAB — IPS PAP SMEAR ONLY

## 2016-06-11 NOTE — Progress Notes (Signed)
Encounter reviewed Taneya Conkel, MD   

## 2016-06-18 ENCOUNTER — Other Ambulatory Visit: Payer: PPO

## 2016-07-06 DIAGNOSIS — Z6829 Body mass index (BMI) 29.0-29.9, adult: Secondary | ICD-10-CM | POA: Diagnosis not present

## 2016-07-06 DIAGNOSIS — N951 Menopausal and female climacteric states: Secondary | ICD-10-CM | POA: Diagnosis not present

## 2016-07-06 DIAGNOSIS — Z79890 Hormone replacement therapy (postmenopausal): Secondary | ICD-10-CM | POA: Diagnosis not present

## 2016-07-06 DIAGNOSIS — G4700 Insomnia, unspecified: Secondary | ICD-10-CM | POA: Diagnosis not present

## 2016-07-08 ENCOUNTER — Ambulatory Visit: Payer: PPO

## 2016-07-08 ENCOUNTER — Other Ambulatory Visit: Payer: PPO

## 2016-07-09 ENCOUNTER — Ambulatory Visit
Admission: RE | Admit: 2016-07-09 | Discharge: 2016-07-09 | Disposition: A | Discharge status: Home / Self Care | Payer: PPO | Source: Ambulatory Visit | Attending: Certified Nurse Midwife | Admitting: Certified Nurse Midwife

## 2016-07-09 ENCOUNTER — Telehealth: Payer: Self-pay | Admitting: Certified Nurse Midwife

## 2016-07-09 DIAGNOSIS — N6310 Unspecified lump in the right breast, unspecified quadrant: Secondary | ICD-10-CM

## 2016-07-09 DIAGNOSIS — N6312 Unspecified lump in the right breast, upper inner quadrant: Secondary | ICD-10-CM | POA: Diagnosis not present

## 2016-07-09 NOTE — Telephone Encounter (Signed)
Spoke with patient, advised of results and recommendations as seen below per Melvia Heaps, CNM. Patient scheduled for 6wk f/u 08/19/16 at 10am with Melvia Heaps, CNM. Patient verbalizes understanding and is agreeable to date and time.  Routing to provider for final review. Patient is agreeable to disposition. Will close encounter.

## 2016-07-09 NOTE — Telephone Encounter (Signed)
Patient returning your call.

## 2016-07-09 NOTE — Telephone Encounter (Signed)
Notes Recorded by Burnice Logan, RN on 07/09/2016 at 12:14 PM EST Left message, ok per current dpr, to return call to office to review recent breast imaging and to schedule 6 week recheck with Melvia Heaps, CNM. ------  Notes Recorded by Burnice Logan, RN on 07/09/2016 at 11:46 AM EST Left message to call Sharee Pimple at [phone].  ------  Notes Recorded by Regina Eck, CNM on 07/09/2016 at 10:40 AM EST Reviewed mammogram and [place] for palpable lump. No concerns noted per [place] and mammogram only fibroglandular and fatty tissue. Patient need OV to recheck before out of hold

## 2016-07-09 NOTE — Telephone Encounter (Signed)
Patient says she is returning a call to Jill. 

## 2016-08-19 ENCOUNTER — Ambulatory Visit (INDEPENDENT_AMBULATORY_CARE_PROVIDER_SITE_OTHER): Payer: PPO | Admitting: Certified Nurse Midwife

## 2016-08-19 ENCOUNTER — Encounter: Payer: Self-pay | Admitting: Certified Nurse Midwife

## 2016-08-19 VITALS — BP 110/72 | HR 72 | Resp 16 | Ht 63.75 in | Wt 169.0 lb

## 2016-08-19 DIAGNOSIS — Z1231 Encounter for screening mammogram for malignant neoplasm of breast: Secondary | ICD-10-CM | POA: Diagnosis not present

## 2016-08-19 DIAGNOSIS — Z1239 Encounter for other screening for malignant neoplasm of breast: Secondary | ICD-10-CM

## 2016-08-19 NOTE — Progress Notes (Signed)
66 y.o. Married Caucasian female [phone] here for follow up of diagnostic mammogram and [place] of right breast. Breast mass was noted at last aex. Patient had not noted any changes. Denies any skin changes or nipple discharge or mass today. Menopausal no vaginal bleeding. Diagnostic mammogram and [place] were negative for any mass only fibroglandular changes only.   O: Healthy WD,WN female Affect: normal, orientation x 3 Skin:warm and dry Breasts: normal appearance, no nipple retraction or dimpling or discharge. No axillary adenopathy. No masses noted bilateral. Area of concern in right breast at 2-3 o'clock is not palpated now.   A:Normal breast exam bilateral Fibroglandular tissue noted on mammogram, benign findings. Family history of breast cancer, 2 sisters 45,46   P: Discussed findings of normal breast exam today and area concern no longer noted. Continue monthly SBE and advise if change. Repeat mammogram in one year per recommendation( her usual screening) Questions addressed.   Rv prn, aex

## 2016-08-22 NOTE — Progress Notes (Signed)
Encounter reviewed Jessica Trice, MD   

## 2016-09-21 DIAGNOSIS — M25511 Pain in right shoulder: Secondary | ICD-10-CM | POA: Diagnosis not present

## 2016-10-19 DIAGNOSIS — M67911 Unspecified disorder of synovium and tendon, right shoulder: Secondary | ICD-10-CM | POA: Diagnosis not present

## 2016-11-02 DIAGNOSIS — Z79890 Hormone replacement therapy (postmenopausal): Secondary | ICD-10-CM | POA: Diagnosis not present

## 2016-11-02 DIAGNOSIS — N951 Menopausal and female climacteric states: Secondary | ICD-10-CM | POA: Diagnosis not present

## 2016-11-02 DIAGNOSIS — G4700 Insomnia, unspecified: Secondary | ICD-10-CM | POA: Diagnosis not present

## 2016-11-24 DIAGNOSIS — E042 Nontoxic multinodular goiter: Secondary | ICD-10-CM | POA: Diagnosis not present

## 2016-11-24 DIAGNOSIS — I10 Essential (primary) hypertension: Secondary | ICD-10-CM | POA: Diagnosis not present

## 2016-11-24 DIAGNOSIS — E782 Mixed hyperlipidemia: Secondary | ICD-10-CM | POA: Diagnosis not present

## 2016-11-24 DIAGNOSIS — J069 Acute upper respiratory infection, unspecified: Secondary | ICD-10-CM | POA: Diagnosis not present

## 2017-01-07 DIAGNOSIS — Z79890 Hormone replacement therapy (postmenopausal): Secondary | ICD-10-CM | POA: Diagnosis not present

## 2017-01-07 DIAGNOSIS — Z6828 Body mass index (BMI) 28.0-28.9, adult: Secondary | ICD-10-CM | POA: Diagnosis not present

## 2017-01-07 DIAGNOSIS — R6882 Decreased libido: Secondary | ICD-10-CM | POA: Diagnosis not present

## 2017-01-07 DIAGNOSIS — N951 Menopausal and female climacteric states: Secondary | ICD-10-CM | POA: Diagnosis not present

## 2017-01-25 DIAGNOSIS — Z79890 Hormone replacement therapy (postmenopausal): Secondary | ICD-10-CM | POA: Diagnosis not present

## 2017-01-25 DIAGNOSIS — Z6827 Body mass index (BMI) 27.0-27.9, adult: Secondary | ICD-10-CM | POA: Diagnosis not present

## 2017-01-25 DIAGNOSIS — G4700 Insomnia, unspecified: Secondary | ICD-10-CM | POA: Diagnosis not present

## 2017-01-25 DIAGNOSIS — N951 Menopausal and female climacteric states: Secondary | ICD-10-CM | POA: Diagnosis not present

## 2017-01-26 DIAGNOSIS — E210 Primary hyperparathyroidism: Secondary | ICD-10-CM | POA: Diagnosis not present

## 2017-01-26 DIAGNOSIS — E041 Nontoxic single thyroid nodule: Secondary | ICD-10-CM | POA: Diagnosis not present

## 2017-01-27 ENCOUNTER — Other Ambulatory Visit: Payer: Self-pay | Admitting: Endocrinology

## 2017-01-27 DIAGNOSIS — E041 Nontoxic single thyroid nodule: Secondary | ICD-10-CM

## 2017-02-01 ENCOUNTER — Ambulatory Visit
Admission: RE | Admit: 2017-02-01 | Discharge: 2017-02-01 | Disposition: A | Discharge status: Home / Self Care | Payer: PPO | Source: Ambulatory Visit | Attending: Endocrinology | Admitting: Endocrinology

## 2017-02-01 DIAGNOSIS — E041 Nontoxic single thyroid nodule: Secondary | ICD-10-CM | POA: Diagnosis not present

## 2017-02-22 DIAGNOSIS — R1013 Epigastric pain: Secondary | ICD-10-CM | POA: Diagnosis not present

## 2017-02-22 DIAGNOSIS — R1011 Right upper quadrant pain: Secondary | ICD-10-CM | POA: Diagnosis not present

## 2017-02-23 ENCOUNTER — Ambulatory Visit
Admission: RE | Admit: 2017-02-23 | Discharge: 2017-02-23 | Disposition: A | Discharge status: Home / Self Care | Payer: PPO | Source: Ambulatory Visit | Attending: Nurse Practitioner | Admitting: Nurse Practitioner

## 2017-02-23 ENCOUNTER — Other Ambulatory Visit: Payer: Self-pay | Admitting: Nurse Practitioner

## 2017-02-23 DIAGNOSIS — R1013 Epigastric pain: Secondary | ICD-10-CM

## 2017-02-23 DIAGNOSIS — R1011 Right upper quadrant pain: Secondary | ICD-10-CM

## 2017-03-01 ENCOUNTER — Other Ambulatory Visit: Payer: Self-pay | Admitting: Gastroenterology

## 2017-03-01 DIAGNOSIS — R1013 Epigastric pain: Secondary | ICD-10-CM

## 2017-03-01 DIAGNOSIS — R634 Abnormal weight loss: Secondary | ICD-10-CM | POA: Diagnosis not present

## 2017-03-01 DIAGNOSIS — R740 Nonspecific elevation of levels of transaminase and lactic acid dehydrogenase [LDH]: Secondary | ICD-10-CM | POA: Diagnosis not present

## 2017-03-01 DIAGNOSIS — R140 Abdominal distension (gaseous): Secondary | ICD-10-CM | POA: Diagnosis not present

## 2017-03-01 NOTE — Progress Notes (Signed)
Vannia Pola MD 

## 2017-03-07 ENCOUNTER — Ambulatory Visit
Admission: RE | Admit: 2017-03-07 | Discharge: 2017-03-07 | Disposition: A | Discharge status: Home / Self Care | Payer: PPO | Source: Ambulatory Visit | Attending: Gastroenterology | Admitting: Gastroenterology

## 2017-03-07 DIAGNOSIS — N200 Calculus of kidney: Secondary | ICD-10-CM | POA: Diagnosis not present

## 2017-03-07 DIAGNOSIS — R1013 Epigastric pain: Secondary | ICD-10-CM

## 2017-03-07 MED ORDER — IOPAMIDOL (ISOVUE-300) INJECTION 61%
100.0000 mL | Freq: Once | INTRAVENOUS | Status: AC | PRN
  Administered 2017-03-07: 100 mL via INTRAVENOUS

## 2017-03-22 DIAGNOSIS — Z7982 Long term (current) use of aspirin: Secondary | ICD-10-CM | POA: Diagnosis not present

## 2017-03-22 DIAGNOSIS — G4700 Insomnia, unspecified: Secondary | ICD-10-CM | POA: Diagnosis not present

## 2017-03-22 DIAGNOSIS — Z6827 Body mass index (BMI) 27.0-27.9, adult: Secondary | ICD-10-CM | POA: Diagnosis not present

## 2017-03-22 DIAGNOSIS — N951 Menopausal and female climacteric states: Secondary | ICD-10-CM | POA: Diagnosis not present

## 2017-03-22 DIAGNOSIS — Z1389 Encounter for screening for other disorder: Secondary | ICD-10-CM | POA: Diagnosis not present

## 2017-04-07 ENCOUNTER — Ambulatory Visit: Payer: PPO | Admitting: Podiatry

## 2017-06-02 ENCOUNTER — Other Ambulatory Visit: Payer: Self-pay | Admitting: Internal Medicine

## 2017-06-02 DIAGNOSIS — Z139 Encounter for screening, unspecified: Secondary | ICD-10-CM

## 2017-06-10 ENCOUNTER — Ambulatory Visit (INDEPENDENT_AMBULATORY_CARE_PROVIDER_SITE_OTHER): Payer: PPO | Admitting: Certified Nurse Midwife

## 2017-06-10 ENCOUNTER — Other Ambulatory Visit (HOSPITAL_COMMUNITY)
Admission: RE | Admit: 2017-06-10 | Discharge: 2017-06-10 | Disposition: A | Discharge status: Home / Self Care | Payer: PPO | Source: Ambulatory Visit | Attending: Obstetrics & Gynecology | Admitting: Obstetrics & Gynecology

## 2017-06-10 ENCOUNTER — Other Ambulatory Visit: Payer: Self-pay

## 2017-06-10 ENCOUNTER — Encounter: Payer: Self-pay | Admitting: Certified Nurse Midwife

## 2017-06-10 VITALS — BP 128/74 | HR 76 | Resp 16 | Ht 65.0 in | Wt 151.0 lb

## 2017-06-10 DIAGNOSIS — Z124 Encounter for screening for malignant neoplasm of cervix: Secondary | ICD-10-CM | POA: Insufficient documentation

## 2017-06-10 DIAGNOSIS — F329 Major depressive disorder, single episode, unspecified: Secondary | ICD-10-CM | POA: Insufficient documentation

## 2017-06-10 DIAGNOSIS — Z01419 Encounter for gynecological examination (general) (routine) without abnormal findings: Secondary | ICD-10-CM

## 2017-06-10 DIAGNOSIS — F32A Depression, unspecified: Secondary | ICD-10-CM | POA: Insufficient documentation

## 2017-06-10 DIAGNOSIS — N841 Polyp of cervix uteri: Secondary | ICD-10-CM

## 2017-06-10 DIAGNOSIS — A389 Scarlet fever, uncomplicated: Secondary | ICD-10-CM | POA: Insufficient documentation

## 2017-06-10 DIAGNOSIS — J3089 Other allergic rhinitis: Secondary | ICD-10-CM | POA: Insufficient documentation

## 2017-06-10 DIAGNOSIS — I319 Disease of pericardium, unspecified: Secondary | ICD-10-CM | POA: Insufficient documentation

## 2017-06-10 NOTE — Patient Instructions (Signed)

## 2017-06-10 NOTE — Progress Notes (Signed)
66 y.o. G39P3003 Married  Caucasian Fe here for annual exam. Menopausal no HRT. Denies vaginal bleeding or vaginal dryness. Has finished all Christmas now with family. Will be spending time just with spouse. Sees PCP yearly for labs,hypertension management,osteopenia, anxiety. All stable. No other health issues today.  Patient's last menstrual period was 06/21/1998.          Sexually active: Yes.    The current method of family planning is post menopausal status.    Exercising: Yes.    body pump, yoga, cardio, and walking Smoker:  no  Health Maintenance: Pap:  06-04-15 neg HPV HR neg, 06-09-16 neg History of Abnormal Pap: no MMG:  1/18 bilateral & u/s rt breast birads 1:neg Self Breast exams: yes Colonoscopy:  2012 neg BMD:   2015 TDaP:  2016 Shingles: 2012 Pneumonia: 2017 Hep C and HIV: unsure Labs: PCP   reports that  has never smoked. she has never used smokeless tobacco. She reports that she does not drink alcohol or use drugs.  Past Medical History:  Diagnosis Date  . Arthritis   . GERD (gastroesophageal reflux disease)   . Hx of scarlet fever    as achild  . Hypertension   . Thyroid disease    hyperparathyroidism  . Vitamin D deficiency     Past Surgical History:  Procedure Laterality Date  . CHOLECYSTECTOMY  1999  . PARATHYROIDECTOMY Right 2010    Current Outpatient Medications  Medication Sig Dispense Refill  . aspirin 81 MG tablet Take 81 mg by mouth daily.    [name] atorvastatin (LIPITOR) 20 MG tablet Three days a week    . calcium carbonate (OS-CAL) 600 MG TABS Take 600 mg by mouth 2 (two) times daily with a meal.    . cetirizine (ZYRTEC) 10 MG tablet Take 10 mg by mouth daily.    . Cholecalciferol (VITAMIN D PO) Take 1,000 mg by mouth daily.     [name] losartan-hydrochlorothiazide (HYZAAR) 100-12.5 MG tablet Take 1 tablet by mouth daily.    . Magnesium 250 MG TABS Take by mouth.    . Multiple Vitamin (THERA) TABS Take 1 tablet by mouth daily.    . Omega-3 Fatty  Acids (FISH OIL) 1000 MG CAPS Take 1 capsule by mouth.    . progesterone (PROMETRIUM) 100 MG capsule Take 1 capsule (100 mg total) by mouth daily. 30 capsule 1  . Progesterone 40 % CREA by Does not apply route.    . temazepam (RESTORIL) 15 MG capsule Take 1 capsule by mouth at bedtime.    [name] venlafaxine XR (EFFEXOR-XR) 37.5 MG 24 hr capsule TAKE 1 CAPSULE BY MOUTH EVERY DAY WITH FOOD  1   No current facility-administered medications for this visit.     Family History  Problem Relation Age of Onset  . Breast cancer Sister 4  . Prostate cancer Father 56  . Breast cancer Maternal Aunt 81  . Prostate cancer Paternal Uncle   . Throat cancer Paternal Grandfather        pipe smoker  . Breast cancer Sister 42  . Lymphoma Sister 46  . Leukemia Sister 35       as a result of chemotherapy from her lymphoma  . Cancer Sister 73       non hodgins lymphoma/ leukemia  . Pancreatic cancer Maternal Aunt 65  . Colon cancer Maternal Aunt        diagnosed in her 42s  . Thyroid cancer Maternal Aunt  dx in her 7s  . Leukemia Maternal Aunt        diagnosed in her 88s  . Breast cancer Cousin        diagnosed in her 72s  . Prostate cancer Paternal Uncle     ROS:  Pertinent items are noted in HPI.  Otherwise, a comprehensive ROS was negative.  Exam:   BP 128/74 (BP Location: Right Arm, Patient Position: Sitting, Cuff Size: Normal)   Pulse 76   Resp 16   Ht 5\' 5"  (1.651 m)   Wt 151 lb (68.5 kg)   LMP 06/21/1998   BMI 25.13 kg/m  Height: 5\' 5"  (165.1 cm) Ht Readings from Last 3 Encounters:  06/10/17 5\' 5"  (1.651 m)  08/19/16 5' 3.75" (1.619 m)  06/09/16 5' 3.75" (1.619 m)    General appearance: alert, cooperative and appears stated age Head: Normocephalic, without obvious abnormality, atraumatic Neck: no adenopathy, supple, symmetrical, trachea midline and thyroid normal to inspection and palpation Lungs: clear to auscultation bilaterally Breasts: normal appearance, no masses or  tenderness, No nipple retraction or dimpling, No nipple discharge or bleeding, No axillary or supraclavicular adenopathy Heart: regular rate and rhythm Abdomen: soft, non-tender; no masses,  no organomegaly Extremities: extremities normal, atraumatic, no cyanosis or edema Skin: Skin color, texture, turgor normal. No rashes or lesions Lymph nodes: Cervical, supraclavicular, and axillary nodes normal. No abnormal inguinal nodes palpated Neurologic: Grossly normal   Pelvic: External genitalia:  no lesions              Urethra:  normal appearing urethra with no masses, tenderness or lesions              Bartholin's and Skene's: normal                 Vagina: normal appearing vagina with normal color and discharge, no lesions              Cervix: multiparous appearance, no cervical motion tenderness and polyp noted in cervix with slight bleeding with pap only. Observed no bleeding on removal of speculum              Pap taken: Yes.   Bimanual Exam:  Uterus:  normal size, contour, position, consistency, mobility, non-tender              Adnexa: normal adnexa and no mass, fullness, tenderness               Rectovaginal: Confirms               Anus:  normal sphincter tone, no lesions  Chaperone present: yes  A:  Well Woman with normal exam  Menopausal no HRT  Cervical polyp  Hypertension/Cholesterol/anxiety management with PCP  Osteopenia with MD management  P:   Reviewed health and wellness pertinent to exam  Aware to advise if vaginal bleeding  Discussed cervical finding and will need removal due to will be symptomatic. Etiology discussed. Will wait until pap smear back to schedule. Patient questions answered.  Continue follow up with PCP as indicated  Pap smear: yes   counseled on breast self exam, mammography screening, feminine hygiene, adequate intake of calcium and vitamin D, diet and exercise  return annually or prn  An After Visit Summary was printed and given to the  patient.

## 2017-06-13 LAB — CYTOLOGY - PAP: Diagnosis: NEGATIVE

## 2017-06-15 ENCOUNTER — Telehealth: Payer: Self-pay

## 2017-06-15 DIAGNOSIS — N841 Polyp of cervix uteri: Secondary | ICD-10-CM

## 2017-06-15 NOTE — Telephone Encounter (Signed)
Spoke with patient results given. Cervical polyp removal scheduled for 07/07/2017 at 10 am with Dr.Miller. Patient is agreeable to date and time. Order placed. 02 recall placed.  Cc: Melvia Heaps CNM   Routing to provider for final review. Patient agreeable to disposition. Will close encounter.

## 2017-06-15 NOTE — Telephone Encounter (Signed)
-----   Message from Regina Eck, CNM sent at 06/15/2017  7:28 AM EST ----- Pap smear negative. 02 Patient needs appointment for Cervical polyp removal. She is aware she will be called to schedule. Waited on pap to make sure normal.

## 2017-07-05 DIAGNOSIS — I10 Essential (primary) hypertension: Secondary | ICD-10-CM | POA: Diagnosis not present

## 2017-07-05 DIAGNOSIS — R7309 Other abnormal glucose: Secondary | ICD-10-CM | POA: Diagnosis not present

## 2017-07-05 DIAGNOSIS — N951 Menopausal and female climacteric states: Secondary | ICD-10-CM | POA: Diagnosis not present

## 2017-07-05 DIAGNOSIS — E785 Hyperlipidemia, unspecified: Secondary | ICD-10-CM | POA: Diagnosis not present

## 2017-07-05 DIAGNOSIS — E559 Vitamin D deficiency, unspecified: Secondary | ICD-10-CM | POA: Diagnosis not present

## 2017-07-06 ENCOUNTER — Telehealth: Payer: Self-pay | Admitting: Obstetrics & Gynecology

## 2017-07-06 NOTE — Telephone Encounter (Signed)
Spoke with patient. Patient states that she has a scratchy throat and some nasal drainage. Denies fever,cough, muscle aches, or chills. States that she is going to take Mucinex and rest. Feels well other than symptoms. Advised will need to monitor and if she feels worse, develops new symptoms, or fever will need to reschedule appointment. Patient is agreeable.  Routing to provider for final review. Patient agreeable to disposition. Will close encounter.

## 2017-07-06 NOTE — Telephone Encounter (Signed)
Patient called states she may have to cancel her procedure for tomorrow.  She has a scratchy throat but no other symptoms.  Says if she develops a fever she will call back to reschedule her procedure.

## 2017-07-07 ENCOUNTER — Encounter: Payer: Self-pay | Admitting: Obstetrics & Gynecology

## 2017-07-07 ENCOUNTER — Ambulatory Visit: Payer: PPO | Admitting: Obstetrics & Gynecology

## 2017-07-07 DIAGNOSIS — N841 Polyp of cervix uteri: Secondary | ICD-10-CM

## 2017-07-07 NOTE — Progress Notes (Signed)
GYNECOLOGY  VISIT  CC:   Cervical polyp removal  HPI: 67 y.o. G27P3003 Married Caucasian female here for cervical polyp removal.  This was noted on exam with Melvia Heaps at [facility] and there was bleeding with her pap smear.  Results of pap were normal.  Denies any PMP bleeding or post coital bleeding.  Has no discharge or pelvic pian.  GYNECOLOGIC HISTORY: Patient's last menstrual period was 06/21/1998. Contraception: post menopausal  Menopausal hormone therapy: progesterone 100mg  daily   Patient Active Problem List   Diagnosis Date Noted  . Allergic rhinitis due to other allergen 06/10/2017  . Depression 06/10/2017  . Pericarditis 06/10/2017  . Scarlet fever 06/10/2017  . Single skin nodule 06/25/2014  . Multiple thyroid nodules 01/08/2014  . Essential hypertension 12/25/2013  . H/O hyperparathyroidism 12/25/2013  . Abdominal pain, chronic, epigastric 05/09/2012  . Hyperparathyroidism ([place]) 01/06/2009  . Hypercalcemia 12/30/2008  . Family history of malignant neoplasm of breast 03/30/2006  . Fibrocystic breast disease 03/30/2006  . Enthesopathy of hip region 08/30/2005  . Esophageal reflux 12/27/2003  . Mixed hyperlipidemia 02/12/2003  . Osteopenia 01/26/2000    Past Medical History:  Diagnosis Date  . Arthritis   . GERD (gastroesophageal reflux disease)   . Hx of scarlet fever    as achild  . Hypertension   . Thyroid disease    hyperparathyroidism  . Vitamin D deficiency     Past Surgical History:  Procedure Laterality Date  . CHOLECYSTECTOMY  1999  . PARATHYROIDECTOMY Right 2010    MEDS:   Current Outpatient Medications on File Prior to Visit  Medication Sig Dispense Refill  . aspirin 81 MG tablet Take 81 mg by mouth daily.    [name] atorvastatin (LIPITOR) 20 MG tablet Three days a week    . calcium carbonate (OS-CAL) 600 MG TABS Take 600 mg by mouth 2 (two) times daily with a meal.    . cetirizine (ZYRTEC) 10 MG tablet Take 10 mg by mouth daily.    .  Cholecalciferol (VITAMIN D PO) Take 1,000 mg by mouth daily.     . Magnesium 250 MG TABS Take by mouth.    . Multiple Vitamin (THERA) TABS Take 1 tablet by mouth daily.    . Omega-3 Fatty Acids (FISH OIL) 1000 MG CAPS Take 1 capsule by mouth.    . progesterone (PROMETRIUM) 100 MG capsule Take 1 capsule (100 mg total) by mouth daily. 30 capsule 1  . Progesterone 40 % CREA by Does not apply route.    . temazepam (RESTORIL) 15 MG capsule Take 1 capsule by mouth at bedtime.    [name] venlafaxine XR (EFFEXOR-XR) 37.5 MG 24 hr capsule TAKE 1 CAPSULE BY MOUTH EVERY DAY WITH FOOD  1  . losartan-hydrochlorothiazide (HYZAAR) 100-12.5 MG tablet Take 1 tablet by mouth daily.     No current facility-administered medications on file prior to visit.     ALLERGIES: Ace inhibitors  Family History  Problem Relation Age of Onset  . Breast cancer Sister 1  . Prostate cancer Father 54  . Breast cancer Maternal Aunt 81  . Prostate cancer Paternal Uncle   . Throat cancer Paternal Grandfather        pipe smoker  . Breast cancer Sister 76  . Lymphoma Sister 97  . Leukemia Sister 59       as a result of chemotherapy from her lymphoma  . Cancer Sister 22       non hodgins lymphoma/ leukemia  .  Pancreatic cancer Maternal Aunt 65  . Colon cancer Maternal Aunt        diagnosed in her 37s  . Thyroid cancer Maternal Aunt        dx in her 68s  . Leukemia Maternal Aunt        diagnosed in her 23s  . Breast cancer Cousin        diagnosed in her 31s  . Prostate cancer Paternal Uncle     SH:  Married, non smoker  Review of Systems  All other systems reviewed and are negative.   PHYSICAL EXAMINATION:    BP 96/60 (BP Location: Right Arm, Patient Position: Sitting, Cuff Size: Large)   Pulse 80   Resp 16   Ht 5\' 5"  (1.651 m)   Wt 150 lb (68 kg)   LMP 06/21/1998   BMI 24.96 kg/m     General appearance: alert, cooperative and appears stated age  Pelvic: External genitalia:  no lesions               Urethra:  normal appearing urethra with no masses, tenderness or lesions              Bartholins and Skenes: normal                 Vagina: normal appearing vagina with normal color and discharge, no lesions              Cervix: no lesions and 1cm endocervical polyp noted.              Bimanual Exam:  Uterus:  normal size, contour, position, consistency, mobility, non-tender               Anus:  no lesions  Procedure:  Due to size of polyp, removal recommended.  Cervix cleansed with Betadine today x 3.  Bleeding occurred with this today.  Silver nitrate used for hemostasis.  Pt tolerated procedure well.   Chaperone was present for exam.  Assessment: Cervical polyp, s/p removal today  Plan: Pathology pending.  This will be called to pt.   [name]

## 2017-07-11 ENCOUNTER — Ambulatory Visit
Admission: RE | Admit: 2017-07-11 | Discharge: 2017-07-11 | Disposition: A | Discharge status: Home / Self Care | Payer: PPO | Source: Ambulatory Visit | Attending: Internal Medicine | Admitting: Internal Medicine

## 2017-07-11 DIAGNOSIS — Z139 Encounter for screening, unspecified: Secondary | ICD-10-CM

## 2017-07-11 DIAGNOSIS — Z1231 Encounter for screening mammogram for malignant neoplasm of breast: Secondary | ICD-10-CM | POA: Diagnosis not present

## 2017-08-02 DIAGNOSIS — M67911 Unspecified disorder of synovium and tendon, right shoulder: Secondary | ICD-10-CM | POA: Diagnosis not present

## 2017-08-02 DIAGNOSIS — M25552 Pain in left hip: Secondary | ICD-10-CM | POA: Diagnosis not present

## 2017-08-23 DIAGNOSIS — M67911 Unspecified disorder of synovium and tendon, right shoulder: Secondary | ICD-10-CM | POA: Diagnosis not present

## 2017-10-25 DIAGNOSIS — H40013 Open angle with borderline findings, low risk, bilateral: Secondary | ICD-10-CM | POA: Diagnosis not present

## 2017-10-25 DIAGNOSIS — H40053 Ocular hypertension, bilateral: Secondary | ICD-10-CM | POA: Diagnosis not present

## 2017-11-01 DIAGNOSIS — Z79899 Other long term (current) drug therapy: Secondary | ICD-10-CM | POA: Diagnosis not present

## 2017-11-01 DIAGNOSIS — I10 Essential (primary) hypertension: Secondary | ICD-10-CM | POA: Diagnosis not present

## 2017-11-01 DIAGNOSIS — N951 Menopausal and female climacteric states: Secondary | ICD-10-CM | POA: Diagnosis not present

## 2017-11-01 DIAGNOSIS — G4700 Insomnia, unspecified: Secondary | ICD-10-CM | POA: Diagnosis not present

## 2017-11-03 DIAGNOSIS — H66002 Acute suppurative otitis media without spontaneous rupture of ear drum, left ear: Secondary | ICD-10-CM | POA: Diagnosis not present

## 2017-11-03 DIAGNOSIS — H6501 Acute serous otitis media, right ear: Secondary | ICD-10-CM | POA: Diagnosis not present

## 2017-11-16 DIAGNOSIS — B070 Plantar wart: Secondary | ICD-10-CM | POA: Diagnosis not present

## 2017-11-16 DIAGNOSIS — H2513 Age-related nuclear cataract, bilateral: Secondary | ICD-10-CM | POA: Diagnosis not present

## 2017-11-16 DIAGNOSIS — H25013 Cortical age-related cataract, bilateral: Secondary | ICD-10-CM | POA: Diagnosis not present

## 2017-11-16 DIAGNOSIS — H18893 Other specified disorders of cornea, bilateral: Secondary | ICD-10-CM | POA: Diagnosis not present

## 2017-11-16 DIAGNOSIS — H40013 Open angle with borderline findings, low risk, bilateral: Secondary | ICD-10-CM | POA: Diagnosis not present

## 2017-11-16 DIAGNOSIS — H6993 Unspecified Eustachian tube disorder, bilateral: Secondary | ICD-10-CM | POA: Diagnosis not present

## 2017-12-29 DIAGNOSIS — M67911 Unspecified disorder of synovium and tendon, right shoulder: Secondary | ICD-10-CM | POA: Diagnosis not present

## 2017-12-29 DIAGNOSIS — M25511 Pain in right shoulder: Secondary | ICD-10-CM | POA: Diagnosis not present

## 2018-02-10 ENCOUNTER — Other Ambulatory Visit: Payer: Self-pay | Admitting: Orthopedic Surgery

## 2018-02-10 DIAGNOSIS — M67912 Unspecified disorder of synovium and tendon, left shoulder: Principal | ICD-10-CM

## 2018-02-10 DIAGNOSIS — M67911 Unspecified disorder of synovium and tendon, right shoulder: Secondary | ICD-10-CM

## 2018-02-10 DIAGNOSIS — M25511 Pain in right shoulder: Secondary | ICD-10-CM

## 2018-02-21 ENCOUNTER — Ambulatory Visit
Admission: RE | Admit: 2018-02-21 | Discharge: 2018-02-21 | Disposition: A | Discharge status: Home / Self Care | Payer: PPO | Source: Ambulatory Visit | Attending: Orthopedic Surgery | Admitting: Orthopedic Surgery

## 2018-02-21 DIAGNOSIS — M25511 Pain in right shoulder: Secondary | ICD-10-CM | POA: Diagnosis not present

## 2018-02-21 DIAGNOSIS — M67912 Unspecified disorder of synovium and tendon, left shoulder: Principal | ICD-10-CM

## 2018-02-21 DIAGNOSIS — E559 Vitamin D deficiency, unspecified: Secondary | ICD-10-CM | POA: Diagnosis not present

## 2018-02-21 DIAGNOSIS — J320 Chronic maxillary sinusitis: Secondary | ICD-10-CM | POA: Diagnosis not present

## 2018-02-21 DIAGNOSIS — M67911 Unspecified disorder of synovium and tendon, right shoulder: Secondary | ICD-10-CM

## 2018-02-21 DIAGNOSIS — I10 Essential (primary) hypertension: Secondary | ICD-10-CM | POA: Diagnosis not present

## 2018-02-21 DIAGNOSIS — Z0000 Encounter for general adult medical examination without abnormal findings: Secondary | ICD-10-CM | POA: Diagnosis not present

## 2018-02-27 DIAGNOSIS — M24811 Other specific joint derangements of right shoulder, not elsewhere classified: Secondary | ICD-10-CM | POA: Diagnosis not present

## 2018-02-27 DIAGNOSIS — M75121 Complete rotator cuff tear or rupture of right shoulder, not specified as traumatic: Secondary | ICD-10-CM | POA: Diagnosis not present

## 2018-03-24 ENCOUNTER — Other Ambulatory Visit: Payer: Self-pay | Admitting: Internal Medicine

## 2018-03-29 DIAGNOSIS — Y999 Unspecified external cause status: Secondary | ICD-10-CM | POA: Diagnosis not present

## 2018-03-29 DIAGNOSIS — G8918 Other acute postprocedural pain: Secondary | ICD-10-CM | POA: Diagnosis not present

## 2018-03-29 DIAGNOSIS — M19011 Primary osteoarthritis, right shoulder: Secondary | ICD-10-CM | POA: Diagnosis not present

## 2018-03-29 DIAGNOSIS — M7541 Impingement syndrome of right shoulder: Secondary | ICD-10-CM | POA: Diagnosis not present

## 2018-03-29 DIAGNOSIS — S46011A Strain of muscle(s) and tendon(s) of the rotator cuff of right shoulder, initial encounter: Secondary | ICD-10-CM | POA: Diagnosis not present

## 2018-03-29 DIAGNOSIS — S43431A Superior glenoid labrum lesion of right shoulder, initial encounter: Secondary | ICD-10-CM | POA: Diagnosis not present

## 2018-03-29 DIAGNOSIS — X58XXXA Exposure to other specified factors, initial encounter: Secondary | ICD-10-CM | POA: Diagnosis not present

## 2018-04-04 DIAGNOSIS — M25611 Stiffness of right shoulder, not elsewhere classified: Secondary | ICD-10-CM | POA: Diagnosis not present

## 2018-04-04 DIAGNOSIS — Z9889 Other specified postprocedural states: Secondary | ICD-10-CM | POA: Diagnosis not present

## 2018-04-04 DIAGNOSIS — S46001D Unspecified injury of muscle(s) and tendon(s) of the rotator cuff of right shoulder, subsequent encounter: Secondary | ICD-10-CM | POA: Diagnosis not present

## 2018-04-06 DIAGNOSIS — S46001D Unspecified injury of muscle(s) and tendon(s) of the rotator cuff of right shoulder, subsequent encounter: Secondary | ICD-10-CM | POA: Diagnosis not present

## 2018-04-06 DIAGNOSIS — M25611 Stiffness of right shoulder, not elsewhere classified: Secondary | ICD-10-CM | POA: Diagnosis not present

## 2018-04-11 DIAGNOSIS — M25611 Stiffness of right shoulder, not elsewhere classified: Secondary | ICD-10-CM | POA: Diagnosis not present

## 2018-04-11 DIAGNOSIS — S46001D Unspecified injury of muscle(s) and tendon(s) of the rotator cuff of right shoulder, subsequent encounter: Secondary | ICD-10-CM | POA: Diagnosis not present

## 2018-04-18 DIAGNOSIS — M25611 Stiffness of right shoulder, not elsewhere classified: Secondary | ICD-10-CM | POA: Diagnosis not present

## 2018-04-27 IMAGING — MG 2D DIGITAL SCREENING BILATERAL MAMMOGRAM WITH 3D TOMO WITH CAD
9 of 12 series · 9 of 28 positions shown · non-contrast
Comparison: Previous exam(s).

CLINICAL DATA: Screening.

EXAM:
2D DIGITAL SCREENING BILATERAL MAMMOGRAM WITH 3D TOMO WITH CAD

[R MLO synth-2D]
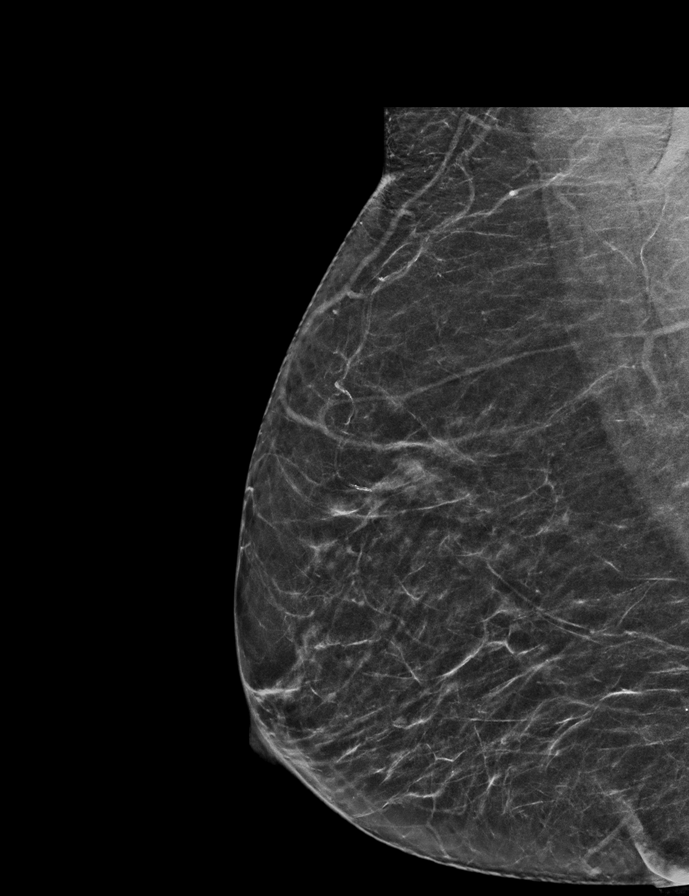

[L CC synth-2D]
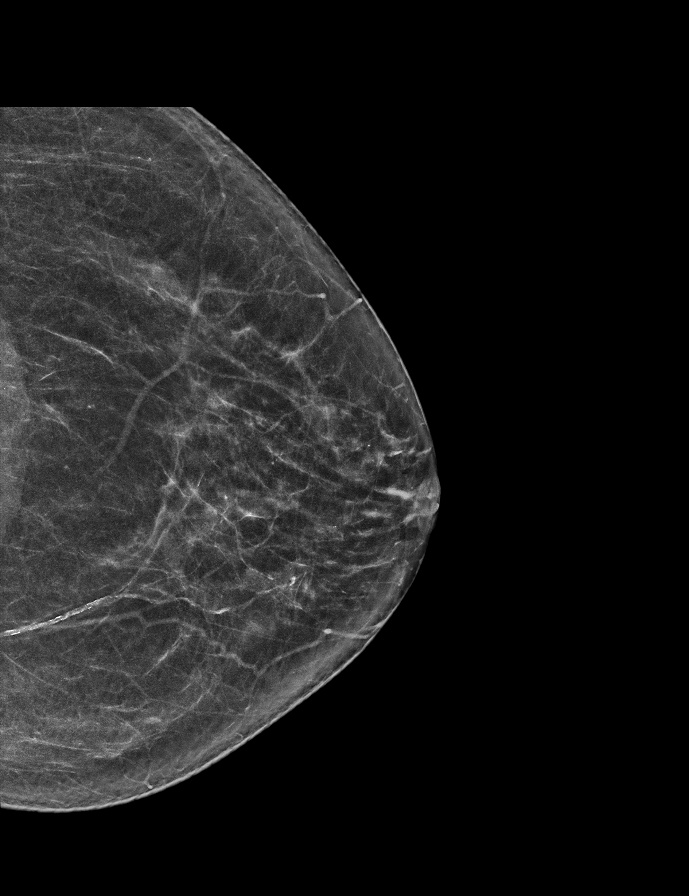

[R MLO]
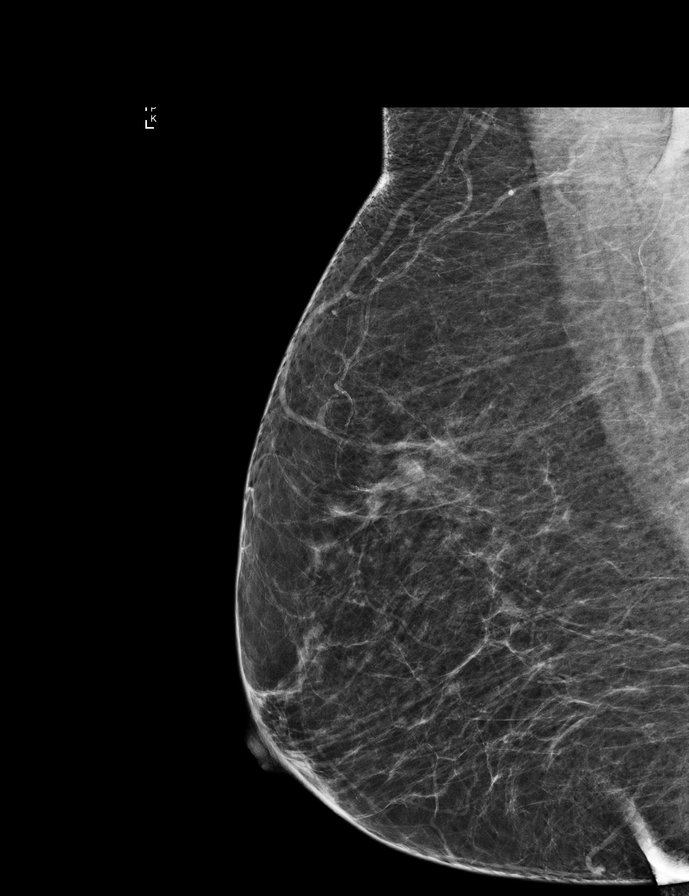

[L MLO]
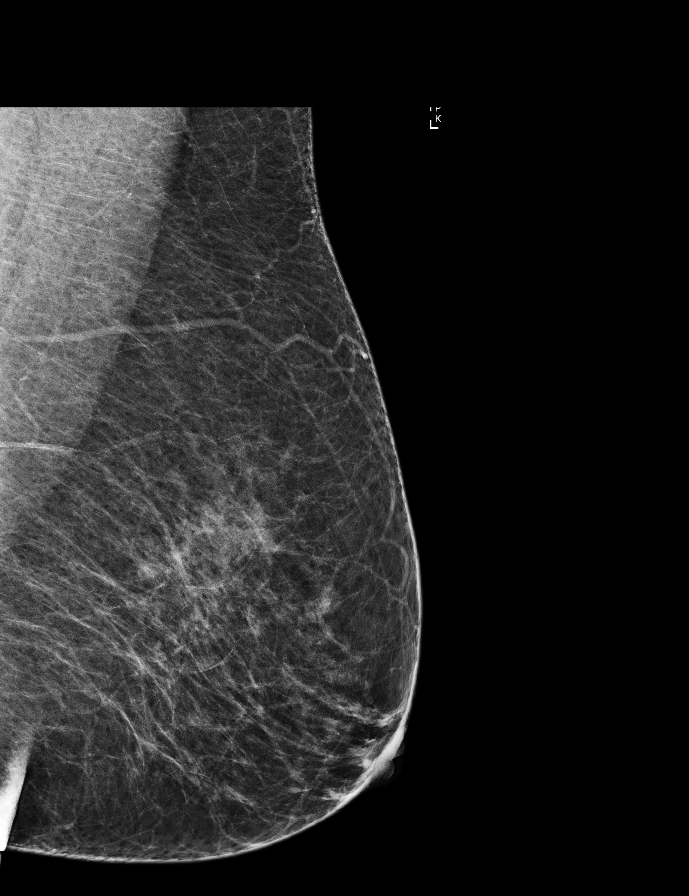

[L CC]
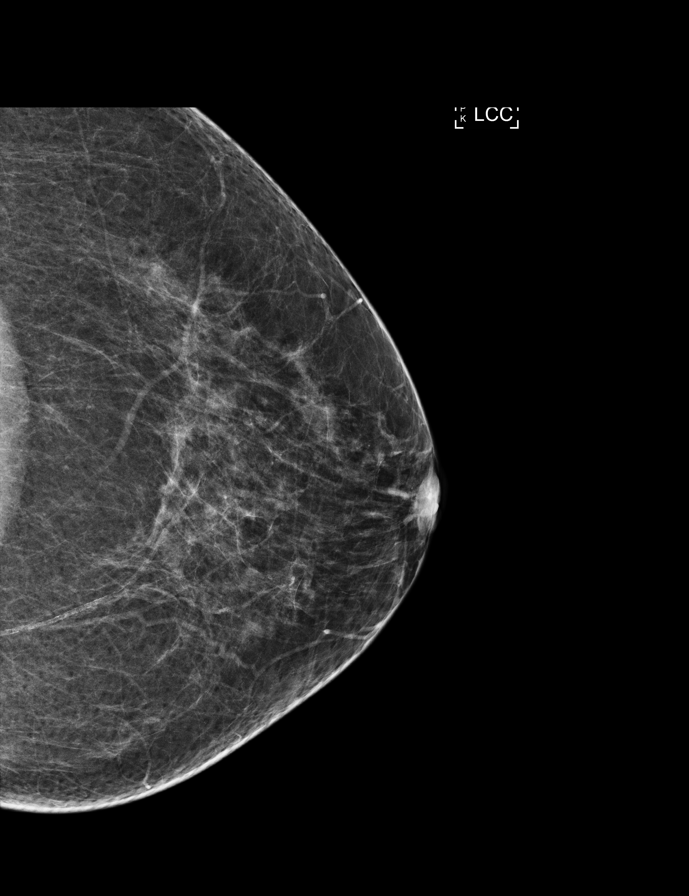

[R CC]
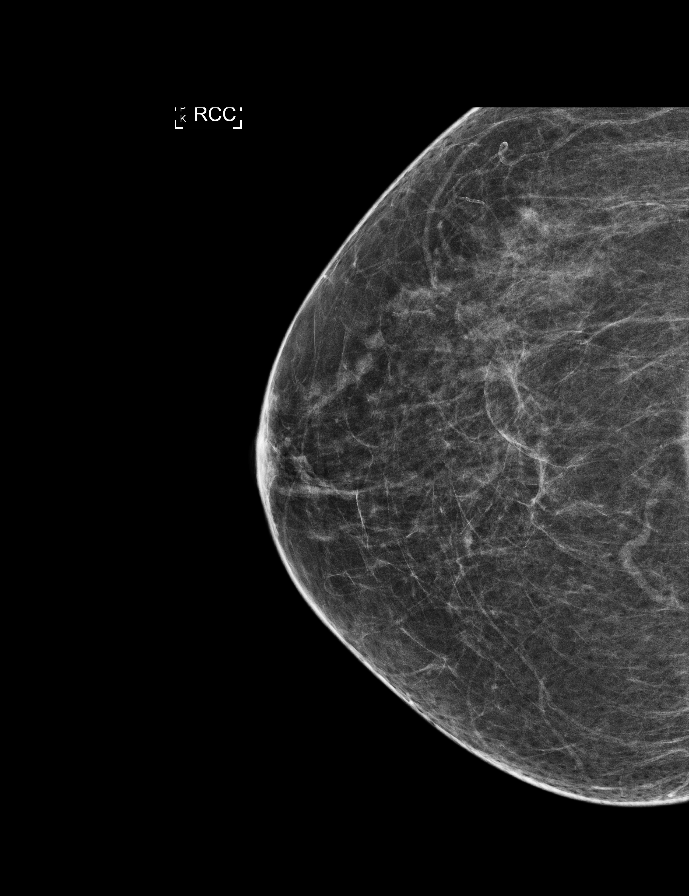

[L MLO synth-2D]
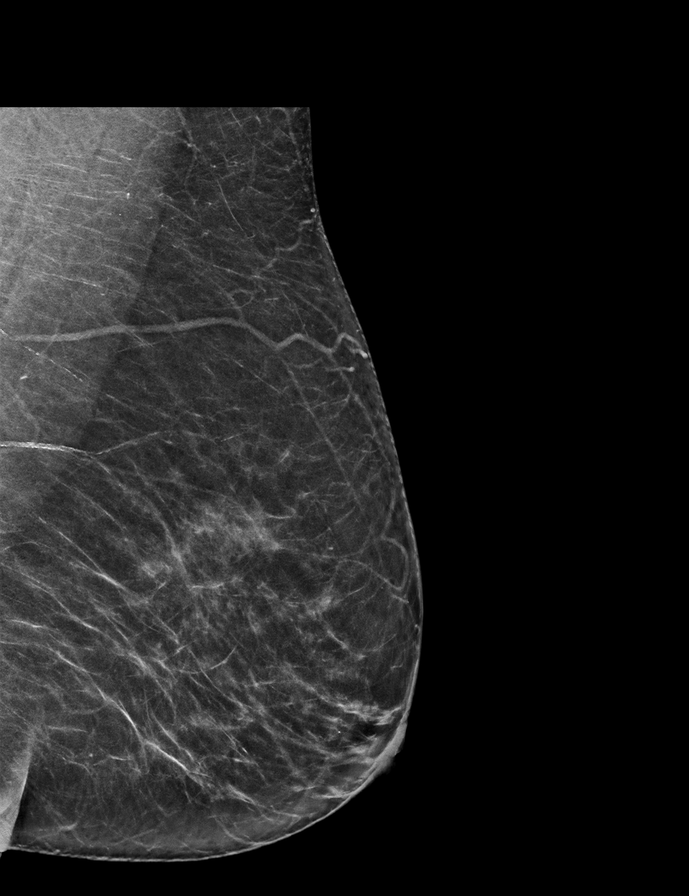

[R CC synth-2D]
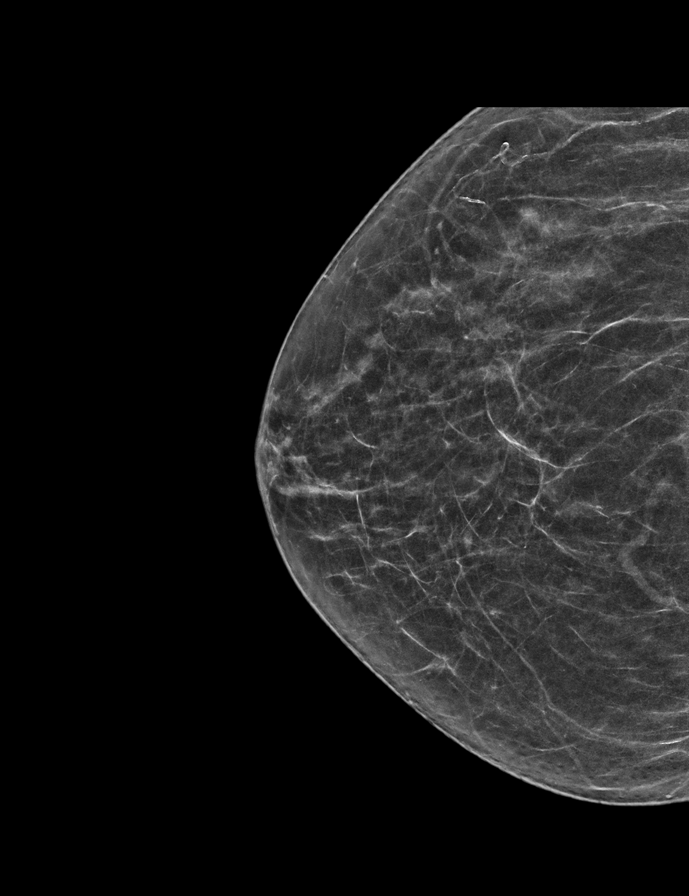

[L CC tomo · tomo slice 31/60.0]
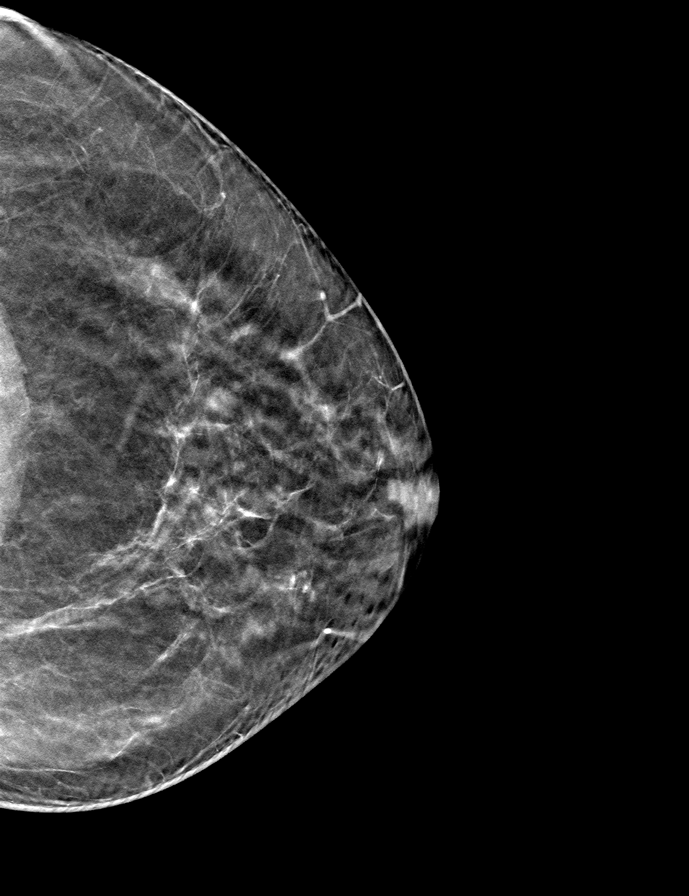

[9 of 28 positions shown; findings below may reference images not displayed]

ACR Breast Density Category b: There are scattered areas of
fibroglandular density.
FINDINGS: There are no findings suspicious for malignancy. Images were
processed with CAD.
IMPRESSION: No mammographic evidence of malignancy. A result letter of this
screening mammogram will be mailed directly to the patient.

RECOMMENDATION:
Screening mammogram in one year. (Code:GE-P-ZS0)

BI-RADS CATEGORY  1: Negative.

## 2018-05-01 DIAGNOSIS — Z9889 Other specified postprocedural states: Secondary | ICD-10-CM | POA: Diagnosis not present

## 2018-05-04 DIAGNOSIS — M25611 Stiffness of right shoulder, not elsewhere classified: Secondary | ICD-10-CM | POA: Diagnosis not present

## 2018-05-04 DIAGNOSIS — S46001D Unspecified injury of muscle(s) and tendon(s) of the rotator cuff of right shoulder, subsequent encounter: Secondary | ICD-10-CM | POA: Diagnosis not present

## 2018-05-08 DIAGNOSIS — S46001D Unspecified injury of muscle(s) and tendon(s) of the rotator cuff of right shoulder, subsequent encounter: Secondary | ICD-10-CM | POA: Diagnosis not present

## 2018-05-08 DIAGNOSIS — M25611 Stiffness of right shoulder, not elsewhere classified: Secondary | ICD-10-CM | POA: Diagnosis not present

## 2018-05-15 DIAGNOSIS — M25611 Stiffness of right shoulder, not elsewhere classified: Secondary | ICD-10-CM | POA: Diagnosis not present

## 2018-05-15 DIAGNOSIS — S46001D Unspecified injury of muscle(s) and tendon(s) of the rotator cuff of right shoulder, subsequent encounter: Secondary | ICD-10-CM | POA: Diagnosis not present

## 2018-05-22 DIAGNOSIS — S46001D Unspecified injury of muscle(s) and tendon(s) of the rotator cuff of right shoulder, subsequent encounter: Secondary | ICD-10-CM | POA: Diagnosis not present

## 2018-05-22 DIAGNOSIS — M25611 Stiffness of right shoulder, not elsewhere classified: Secondary | ICD-10-CM | POA: Diagnosis not present

## 2018-05-29 ENCOUNTER — Other Ambulatory Visit: Payer: Self-pay | Admitting: Internal Medicine

## 2018-05-29 ENCOUNTER — Telehealth: Payer: Self-pay

## 2018-05-29 DIAGNOSIS — Z9889 Other specified postprocedural states: Secondary | ICD-10-CM | POA: Diagnosis not present

## 2018-05-29 MED ORDER — TEMAZEPAM 15 MG PO CAPS: 15.0000 mg | ORAL_CAPSULE | Freq: Every evening | ORAL | 2 refills | Status: DC | PRN

## 2018-05-29 NOTE — Telephone Encounter (Signed)
The patient is requesting a refill of Temazepam.

## 2018-06-12 ENCOUNTER — Other Ambulatory Visit: Payer: Self-pay | Admitting: Internal Medicine

## 2018-06-12 DIAGNOSIS — Z1231 Encounter for screening mammogram for malignant neoplasm of breast: Secondary | ICD-10-CM

## 2018-06-22 ENCOUNTER — Encounter: Payer: Self-pay | Admitting: Certified Nurse Midwife

## 2018-06-22 ENCOUNTER — Other Ambulatory Visit (HOSPITAL_COMMUNITY)
Admission: RE | Admit: 2018-06-22 | Discharge: 2018-06-22 | Disposition: A | Discharge status: Home / Self Care | Payer: PPO | Source: Ambulatory Visit | Attending: Obstetrics & Gynecology | Admitting: Obstetrics & Gynecology

## 2018-06-22 ENCOUNTER — Ambulatory Visit (INDEPENDENT_AMBULATORY_CARE_PROVIDER_SITE_OTHER): Payer: PPO | Admitting: Certified Nurse Midwife

## 2018-06-22 ENCOUNTER — Other Ambulatory Visit: Payer: Self-pay

## 2018-06-22 VITALS — BP 104/64 | HR 68 | Resp 16 | Ht 63.75 in | Wt 158.0 lb

## 2018-06-22 DIAGNOSIS — Z124 Encounter for screening for malignant neoplasm of cervix: Secondary | ICD-10-CM | POA: Diagnosis not present

## 2018-06-22 DIAGNOSIS — N814 Uterovaginal prolapse, unspecified: Secondary | ICD-10-CM

## 2018-06-22 DIAGNOSIS — Z01411 Encounter for gynecological examination (general) (routine) with abnormal findings: Secondary | ICD-10-CM

## 2018-06-22 DIAGNOSIS — Z780 Asymptomatic menopausal state: Secondary | ICD-10-CM

## 2018-06-22 NOTE — Progress Notes (Signed)
68 y.o. G28P3003 Married  Caucasian Fe here for annual exam. Menopausal, denies any vaginal dryness or bleeding. Sees PCP [facility] for hypertension, cholesterol, Vitamin D and labs with PCP. All medications stable. Aware she has gained weight during holiday and plans to do portion control weight loss soon along with her regular exercise program. No health issue today.  Patient's last menstrual period was 06/21/1998.          Sexually active: Yes.    The current method of family planning is post menopausal status.    Exercising: Yes.    yoga, cycle, body pump, walking Smoker:  no  Review of Systems  Constitutional: Negative.   HENT: Negative.   Eyes: Negative.   Respiratory: Negative.   Cardiovascular: Negative.   Gastrointestinal: Negative.   Genitourinary: Negative.   Musculoskeletal: Negative.   Skin: Negative.   Neurological: Negative.   Endo/Heme/Allergies: Negative.   Psychiatric/Behavioral: Negative.     Health Maintenance: Pap:  06-09-16 neg, 06-10-17 neg History of Abnormal Pap: no MMG:  07-11-17 category b density birads 1:neg Self Breast exams: yes Colonoscopy:  2012 neg BMD:   2015 TDaP:  2016 Shingles: 2012 Pneumonia: 2017 Hep C and HIV: not done Labs: with PCP   reports that she has never smoked. She has never used smokeless tobacco. She reports that she does not drink alcohol or use drugs.  Past Medical History:  Diagnosis Date  . Arthritis   . GERD (gastroesophageal reflux disease)   . Hx of scarlet fever    as achild  . Hypertension   . Thyroid disease    hyperparathyroidism  . Vitamin D deficiency     Past Surgical History:  Procedure Laterality Date  . CHOLECYSTECTOMY  1999  . PARATHYROIDECTOMY Right 2010    Current Outpatient Medications  Medication Sig Dispense Refill  . aspirin 81 MG tablet Take 81 mg by mouth daily.    [name] atorvastatin (LIPITOR) 20 MG tablet Three days a week    . calcium carbonate (OS-CAL) 600 MG TABS Take 600 mg by mouth  2 (two) times daily with a meal.    . cetirizine (ZYRTEC) 10 MG tablet Take 10 mg by mouth daily.    . Cholecalciferol (VITAMIN D PO) Take 1,000 mg by mouth daily.     [name] losartan-hydrochlorothiazide (HYZAAR) 100-12.5 MG tablet TAKE 1 TABLET BY MOUTH ONCE DAILY 90 tablet 1  . Magnesium 250 MG TABS Take by mouth.    . Multiple Vitamin (THERA) TABS Take 1 tablet by mouth daily.    . Omega-3 Fatty Acids (FISH OIL) 1000 MG CAPS Take 1 capsule by mouth.    . temazepam (RESTORIL) 15 MG capsule Take 1 capsule (15 mg total) by mouth at bedtime as needed. 30 capsule 2   No current facility-administered medications for this visit.     Family History  Problem Relation Age of Onset  . Breast cancer Sister 18  . Prostate cancer Father 58  . Breast cancer Maternal Aunt 81  . Prostate cancer Paternal Uncle   . Throat cancer Paternal Grandfather        pipe smoker  . Breast cancer Sister 37  . Lymphoma Sister 12  . Leukemia Sister 82       as a result of chemotherapy from her lymphoma  . Cancer Sister 18       non hodgins lymphoma/ leukemia  . Pancreatic cancer Maternal Aunt 65  . Colon cancer Maternal Aunt  diagnosed in her 76s  . Thyroid cancer Maternal Aunt        dx in her 38s  . Leukemia Maternal Aunt        diagnosed in her 23s  . Breast cancer Cousin        diagnosed in her 66s  . Prostate cancer Paternal Uncle     ROS:  Pertinent items are noted in HPI.  Otherwise, a comprehensive ROS was negative.  Exam:   BP 104/64   Pulse 68   Resp 16   Ht 5' 3.75" (1.619 m)   Wt 158 lb (71.7 kg)   LMP 06/21/1998   BMI 27.33 kg/m  Height: 5' 3.75" (161.9 cm) Ht Readings from Last 3 Encounters:  06/22/18 5' 3.75" (1.619 m)  07/07/17 5\' 5"  (1.651 m)  06/10/17 5\' 5"  (1.651 m)    General appearance: alert, cooperative and appears stated age Head: Normocephalic, without obvious abnormality, atraumatic Neck: no adenopathy, supple, symmetrical, trachea midline and thyroid normal to  inspection and palpation Lungs: clear to auscultation bilaterally Breasts: normal appearance, no masses or tenderness, No nipple retraction or dimpling, No nipple discharge or bleeding, No axillary or supraclavicular adenopathy Heart: regular rate and rhythm Abdomen: soft, non-tender; no masses,  no organomegaly Extremities: extremities normal, atraumatic, no cyanosis or edema Skin: Skin color, texture, turgor normal. No rashes or lesions Lymph nodes: Cervical, supraclavicular, and axillary nodes normal. No abnormal inguinal nodes palpated Neurologic: Grossly normal   Pelvic: External genitalia:  no lesions, atrophic appearance              Urethra:  normal appearing urethra with no masses, tenderness or lesions              Bartholin's and Skene's: normal                 Vagina: normal appearing vagina with normal color and discharge, no lesions, uterine prolapse grade 3, cystocele grade 2, not symptomatic              Cervix: multiparous appearance, no cervical motion tenderness and no lesions              Pap taken: Yes.   Bimanual Exam:  Uterus:  normal size, contour, position, consistency, mobility, non-tender              Adnexa: normal adnexa and no mass, fullness, tenderness               Rectovaginal: Confirms               Anus:  normal sphincter tone, no lesions  Chaperone present: yes  A:  Well Woman with normal exam  Post Menopausal with uterine prolapse and cystocele, not symptomatic  Hypertension, cholesterol, Vitamin D management with PCP, all stable per patient  BMD due, previous normal  P:   Reviewed health and wellness pertinent to exam  Aware of need to advise if vaginal bleeding. Discussed known vaginal finding and pessary option if becomes symptomatic. Patient aware and will advise if change.  Continue follow up with PCP as indicated.  Order for BMD placed, patient to call and schedule.  Pap smear: yes Patient requests yearly   counseled on breast self exam,  mammography screening, feminine hygiene, adequate intake of calcium and vitamin D, diet and exercise, Kegel's exercises  return annually or prn  An After Visit Summary was printed and given to the patient.

## 2018-06-22 NOTE — Patient Instructions (Signed)

## 2018-06-27 ENCOUNTER — Ambulatory Visit: Payer: PPO | Admitting: Internal Medicine

## 2018-06-27 LAB — CYTOLOGY - PAP
Diagnosis: NEGATIVE
HPV: NOT DETECTED

## 2018-07-03 DIAGNOSIS — H40013 Open angle with borderline findings, low risk, bilateral: Secondary | ICD-10-CM | POA: Diagnosis not present

## 2018-07-13 ENCOUNTER — Ambulatory Visit: Payer: PPO

## 2018-07-25 ENCOUNTER — Ambulatory Visit: Payer: PPO | Admitting: Internal Medicine

## 2018-08-04 ENCOUNTER — Other Ambulatory Visit: Payer: Self-pay | Admitting: Internal Medicine

## 2018-08-04 MED ORDER — OSELTAMIVIR PHOSPHATE 75 MG PO CAPS: 75.0000 mg | ORAL_CAPSULE | Freq: Every day | ORAL | 0 refills | Status: AC

## 2018-08-16 ENCOUNTER — Ambulatory Visit
Admission: RE | Admit: 2018-08-16 | Discharge: 2018-08-16 | Disposition: A | Discharge status: Home / Self Care | Payer: PPO | Source: Ambulatory Visit | Attending: Internal Medicine | Admitting: Internal Medicine

## 2018-08-16 ENCOUNTER — Ambulatory Visit
Admission: RE | Admit: 2018-08-16 | Discharge: 2018-08-16 | Disposition: A | Discharge status: Home / Self Care | Payer: PPO | Source: Ambulatory Visit | Attending: Certified Nurse Midwife | Admitting: Certified Nurse Midwife

## 2018-08-16 DIAGNOSIS — Z780 Asymptomatic menopausal state: Secondary | ICD-10-CM | POA: Diagnosis not present

## 2018-08-16 DIAGNOSIS — Z1231 Encounter for screening mammogram for malignant neoplasm of breast: Secondary | ICD-10-CM

## 2018-08-16 DIAGNOSIS — M8589 Other specified disorders of bone density and structure, multiple sites: Secondary | ICD-10-CM | POA: Diagnosis not present

## 2018-08-29 ENCOUNTER — Other Ambulatory Visit: Payer: Self-pay | Admitting: Internal Medicine

## 2018-08-29 NOTE — Telephone Encounter (Signed)
Temazepam refill 

## 2018-09-04 ENCOUNTER — Other Ambulatory Visit: Payer: Self-pay | Admitting: Internal Medicine

## 2018-09-07 ENCOUNTER — Encounter: Payer: Self-pay | Admitting: Certified Nurse Midwife

## 2018-09-07 ENCOUNTER — Ambulatory Visit (INDEPENDENT_AMBULATORY_CARE_PROVIDER_SITE_OTHER): Payer: PPO | Admitting: Certified Nurse Midwife

## 2018-09-07 ENCOUNTER — Other Ambulatory Visit: Payer: Self-pay

## 2018-09-07 VITALS — BP 120/70 | HR 70 | Temp 98.1°F | Resp 16 | Wt 161.0 lb

## 2018-09-07 DIAGNOSIS — N814 Uterovaginal prolapse, unspecified: Secondary | ICD-10-CM

## 2018-09-07 NOTE — Patient Instructions (Signed)
Pelvic Organ Prolapse Pelvic organ prolapse is the stretching, bulging, or dropping of pelvic organs into an abnormal position. It happens when the muscles and tissues that surround and support pelvic structures become weak or stretched. Pelvic organ prolapse can involve the:  Vagina (vaginal prolapse).  Uterus (uterine prolapse).  Bladder (cystocele).  Rectum (rectocele).  Intestines (enterocele). When organs other than the vagina are involved, they often bulge into the vagina or protrude from the vagina, depending on how severe the prolapse is. What are the causes? This condition may be caused by:  Pregnancy, labor, and childbirth.  Past pelvic surgery.  Decreased production of the hormone estrogen associated with menopause.  Consistently lifting more than 50 lb (23 kg).  Obesity.  Long-term inability to pass stool (chronic constipation).  A cough that lasts a long time (chronic).  Buildup of fluid in the abdomen due to certain diseases and other conditions. What are the signs or symptoms? Symptoms of this condition include:  Passing a little urine (loss of bladder control) when you cough, sneeze, strain, and exercise (stress incontinence). This may be worse immediately after childbirth. It may gradually improve over time.  Feeling pressure in your pelvis or vagina. This pressure may increase when you cough or when you are passing stool.  A bulge that protrudes from the opening of your vagina.  Difficulty passing urine or stool.  Pain in your lower back.  Pain, discomfort, or disinterest in sex.  Repeated bladder infections (urinary tract infections).  Difficulty inserting a tampon. In some people, this condition causes no symptoms. How is this diagnosed? This condition may be diagnosed based on a vaginal and rectal exam. During the exam, you may be asked to cough and strain while you are lying down, sitting, and standing up. Your health care provider will  determine if other tests are required, such as bladder function tests. How is this treated? Treatment for this condition may depend on your symptoms. Treatment may include:  Lifestyle changes, such as changes to your diet.  Emptying your bladder at scheduled times (bladder training therapy). This can help reduce or avoid urinary incontinence.  Estrogen. Estrogen may help mild prolapse by increasing the strength and tone of pelvic floor muscles.  Kegel exercises. These may help mild cases of prolapse by strengthening and tightening the muscles of the pelvic floor.  A soft, flexible device that helps support the vaginal walls and keep pelvic organs in place (pessary). This is inserted into your vagina by your health care provider.  Surgery. This is often the only form of treatment for severe prolapse. Follow these instructions at home:  Avoid drinking beverages that contain caffeine or alcohol.  Increase your intake of high-fiber foods. This can help decrease constipation and straining during bowel movements.  Lose weight if recommended by your health care provider.  Wear a sanitary pad or adult diapers if you have urinary incontinence.  Avoid heavy lifting and straining with exercise and work. Do not hold your breath when you perform mild to moderate lifting and exercise activities. Limit your activities as directed by your health care provider.  Do Kegel exercises as directed by your health care provider. To do this: ? Squeeze your pelvic floor muscles tight. You should feel a tight lift in your rectal area and a tightness in your vaginal area. Keep your stomach, buttocks, and legs relaxed. ? Hold the muscles tight for up to 10 seconds. ? Relax your muscles. ? Repeat this exercise 50 times a day,   or as many times as told by your health care provider. Continue to do this exercise for at least 4-6 weeks, or for as long as told by your health care provider.  Take over-the-counter and  prescription medicines only as told by your health care provider.  If you have a pessary, take care of it as told by your health care provider.  Keep all follow-up visits as told by your health care provider. This is important. Contact a health care provider if you:  Have symptoms that interfere with your daily activities or sex life.  Need medicine to help with the discomfort.  Notice bleeding from your vagina that is not related to your period.  Have a fever.  Have pain or bleeding when you urinate.  Have bleeding when you pass stool.  Pass urine when you have sex.  Have chronic constipation.  Have a pessary that falls out.  Have bad smelling vaginal discharge.  Have an unusual, low pain in your abdomen. Summary  Pelvic organ prolapse is the stretching, bulging, or dropping of pelvic organs into an abnormal position. It happens when the muscles and tissues that surround and support pelvic structures become weak or stretched.  When organs other than the vagina are involved, they often bulge into the vagina or protrude from the vagina, depending on how severe the prolapse is.  In most cases, this condition needs to be treated only if it produces symptoms. Treatment may include lifestyle changes, estrogen, Kegel exercises, pessary insertion, or surgery.  Avoid heavy lifting and straining with exercise and work. Do not hold your breath when you perform mild to moderate lifting and exercise activities. Limit your activities as directed by your health care provider. This information is not intended to replace advice given to you by your health care provider. Make sure you discuss any questions you have with your health care provider. Document Released: 01/02/2014 Document Revised: 06/29/2017 Document Reviewed: 06/29/2017 Elsevier Interactive Patient Education  2019 [employer]  

## 2018-09-07 NOTE — Progress Notes (Signed)
  Subjective:     Patient ID: Jessica Crosby, female   DOB: 12/03/1950, 68 y.o.   MRN: [id number]  68 yo white married white female g3 [id number] here with complaint of something protruding from vagina at times. Noted after sexual activity. Had spouse look and acknowledge something was there. Here to be checked. Patient is  menopausal occasional vaginal dryness, no vaginal bleeding. Denies pelvic pain or urinary symptoms or incontinence. No other health issues.    Review of Systems  Constitutional: Negative.   HENT: Negative.   Eyes: Negative.   Respiratory: Negative.   Cardiovascular: Negative.   Gastrointestinal: Negative.   Endocrine: Negative.   Genitourinary: Negative for genital sores, pelvic pain, vaginal discharge and vaginal pain.       Noticed vaginal lump on right  Skin: Negative.   Allergic/Immunologic: Negative.   Neurological: Negative.   Hematological: Negative.   Psychiatric/Behavioral: Negative.        Objective:   Physical Exam Exam conducted with a chaperone present.  Constitutional:      Appearance: Normal appearance.  Cardiovascular:     Rate and Rhythm: Normal rate.  Pulmonary:     Effort: Pulmonary effort is normal.  Genitourinary:    General: Normal vulva.     Labia:        Right: No rash, tenderness, lesion or injury.        Left: No rash, tenderness, lesion or injury.      Vagina: Normal.     Cervix: Normal.     Uterus: Normal. With uterine prolapse.      Adnexa: Right adnexa normal and left adnexa normal.     Rectum: Normal.     Comments: Grade 2 cystocele noted with grade 3 uterine prolapse Lymphadenopathy:     Lower Body: No right inguinal adenopathy. No left inguinal adenopathy.  Skin:    General: Skin is warm and dry.  Neurological:     Mental Status: She is alert and oriented to person, place, and time.  Psychiatric:        Attention and Perception: Perception normal.        Mood and Affect: Mood and affect normal.      Behavior: Behavior normal.        Thought Content: Thought content normal.        Cognition and Memory: Cognition normal.        Judgment: Judgment normal.        Assessment:     Grade 2 cystocele with Grade 3 uterine prolapse not symptomatic Normal pelvic exam    Plan:     Discussed finding of cystocele and uterine prolapse and etiology. Show area in vagina with mirror. Discussed no treatment needed unless symptoms of pressure and fullness occurs or urinary symptoms. Handout given regarding finding and warning signs. Discussed pessary use if becomes symptomatic. Questions answered at length. Patient feels reassured and will advise if change.    Rv prn

## 2018-09-13 ENCOUNTER — Other Ambulatory Visit: Payer: Self-pay

## 2018-09-13 MED ORDER — LOSARTAN POTASSIUM-HCTZ 100-12.5 MG PO TABS: 1.0000 | ORAL_TABLET | Freq: Every day | ORAL | 1 refills | Status: DC

## 2018-09-20 ENCOUNTER — Other Ambulatory Visit: Payer: Self-pay | Admitting: Internal Medicine

## 2018-09-20 MED ORDER — HYDROCHLOROTHIAZIDE 12.5 MG PO CAPS: 12.5000 mg | ORAL_CAPSULE | Freq: Every day | ORAL | 1 refills | Status: DC

## 2018-09-20 MED ORDER — LOSARTAN POTASSIUM 100 MG PO TABS: 100.0000 mg | ORAL_TABLET | Freq: Every day | ORAL | 1 refills | Status: DC

## 2018-11-26 ENCOUNTER — Other Ambulatory Visit: Payer: Self-pay | Admitting: Internal Medicine

## 2018-11-28 NOTE — Telephone Encounter (Signed)
Temazepam refill 

## 2019-01-09 DIAGNOSIS — H40013 Open angle with borderline findings, low risk, bilateral: Secondary | ICD-10-CM | POA: Diagnosis not present

## 2019-02-19 DIAGNOSIS — E041 Nontoxic single thyroid nodule: Secondary | ICD-10-CM | POA: Diagnosis not present

## 2019-02-19 DIAGNOSIS — E210 Primary hyperparathyroidism: Secondary | ICD-10-CM | POA: Diagnosis not present

## 2019-02-24 ENCOUNTER — Other Ambulatory Visit: Payer: Self-pay | Admitting: Internal Medicine

## 2019-02-27 ENCOUNTER — Other Ambulatory Visit: Payer: Self-pay | Admitting: Endocrinology

## 2019-02-27 DIAGNOSIS — E041 Nontoxic single thyroid nodule: Secondary | ICD-10-CM

## 2019-03-01 ENCOUNTER — Encounter: Payer: Self-pay | Admitting: Nurse Practitioner

## 2019-03-01 ENCOUNTER — Ambulatory Visit (INDEPENDENT_AMBULATORY_CARE_PROVIDER_SITE_OTHER): Payer: PPO | Admitting: Nurse Practitioner

## 2019-03-01 ENCOUNTER — Other Ambulatory Visit: Payer: Self-pay

## 2019-03-01 VITALS — BP 162/100 | HR 68 | Temp 98.1°F | Ht 63.4 in | Wt 157.2 lb

## 2019-03-01 DIAGNOSIS — M79671 Pain in right foot: Secondary | ICD-10-CM

## 2019-03-01 DIAGNOSIS — M79672 Pain in left foot: Secondary | ICD-10-CM | POA: Diagnosis not present

## 2019-03-01 DIAGNOSIS — R232 Flushing: Secondary | ICD-10-CM | POA: Diagnosis not present

## 2019-03-01 DIAGNOSIS — G4700 Insomnia, unspecified: Secondary | ICD-10-CM

## 2019-03-01 DIAGNOSIS — E782 Mixed hyperlipidemia: Secondary | ICD-10-CM | POA: Diagnosis not present

## 2019-03-01 DIAGNOSIS — Z23 Encounter for immunization: Secondary | ICD-10-CM

## 2019-03-01 DIAGNOSIS — I10 Essential (primary) hypertension: Secondary | ICD-10-CM | POA: Diagnosis not present

## 2019-03-01 MED ORDER — TEMAZEPAM 15 MG PO CAPS: 15.0000 mg | ORAL_CAPSULE | Freq: Every evening | ORAL | 2 refills | Status: DC | PRN

## 2019-03-01 NOTE — Progress Notes (Addendum)
Subjective:     Patient ID: Jessica Crosby , female    DOB: [birth year] , 68 y.o.   MRN: [id number]   Chief Complaint  Patient presents with  . Hot Flashes    patient presents today for a refill on temazepam    HPI  Bilateral foot pain for the last 2 months.  She has purchased new shoes from [employer] and continues to have foot discomfort mostly worse in am when getting out of bed and when sitting for long periods.    Insomnia Primary symptoms: no sleep disturbance.  The current episode started more than one month. The problem has been gradually improving since onset. The symptoms are relieved by medication. The treatment provided moderate relief. PMH includes: associated symptoms present, hypertension, no depression.  Hypertension This is a chronic problem. The current episode started more than 1 year ago. The problem has been gradually worsening (today is elevated missed the dose this morning) since onset. The problem is uncontrolled. Pertinent negatives include no anxiety, blurred vision or headaches.     Past Medical History:  Diagnosis Date  . Arthritis   . GERD (gastroesophageal reflux disease)   . Hx of scarlet fever    as achild  . Hypertension   . Thyroid disease    hyperparathyroidism  . Vitamin D deficiency      Family History  Problem Relation Age of Onset  . Breast cancer Sister 64  . Prostate cancer Father 76  . Breast cancer Maternal Aunt 81  . Prostate cancer Paternal Uncle   . Throat cancer Paternal Grandfather        pipe smoker  . Breast cancer Sister 20  . Lymphoma Sister 39  . Leukemia Sister 60       as a result of chemotherapy from her lymphoma  . Cancer Sister 57       non hodgins lymphoma/ leukemia  . Pancreatic cancer Maternal Aunt 65  . Colon cancer Maternal Aunt        diagnosed in her 82s  . Thyroid cancer Maternal Aunt        dx in her 64s  . Leukemia Maternal Aunt        diagnosed in her 94s  . Breast cancer Cousin    diagnosed in her 7s  . Prostate cancer Paternal Uncle      Current Outpatient Medications:  .  aspirin 81 MG tablet, Take 81 mg by mouth daily., Disp: , Rfl:  .  atorvastatin (LIPITOR) 20 MG tablet, Three days a week, Disp: , Rfl:  .  calcium carbonate (OS-CAL) 600 MG TABS, Take 600 mg by mouth 2 (two) times daily with a meal., Disp: , Rfl:  .  cetirizine (ZYRTEC) 10 MG tablet, Take 10 mg by mouth daily., Disp: , Rfl:  .  Cholecalciferol (VITAMIN D PO), Take 1,000 mg by mouth daily. , Disp: , Rfl:  .  hydrochlorothiazide (MICROZIDE) 12.5 MG capsule, Take 1 capsule (12.5 mg total) by mouth daily., Disp: 90 capsule, Rfl: 1 .  losartan (COZAAR) 100 MG tablet, Take 1 tablet (100 mg total) by mouth daily., Disp: 90 tablet, Rfl: 1 .  Magnesium 250 MG TABS, Take by mouth., Disp: , Rfl:  .  Multiple Vitamin (THERA) TABS, Take 1 tablet by mouth daily., Disp: , Rfl:  .  Omega-3 Fatty Acids (FISH OIL) 1000 MG CAPS, Take 1 capsule by mouth., Disp: , Rfl:  .  temazepam (RESTORIL) 15 MG capsule, TAKE 1  CAPSULE BY MOUTH EVERY DAY AT BEDTIME AS NEEDED, Disp: 30 capsule, Rfl: 2   Allergies  Allergen Reactions  . Ace Inhibitors Cough     Review of Systems  Constitutional: Negative.   Eyes: Negative for blurred vision.  Respiratory: Negative.   Cardiovascular: Negative.   Neurological: Negative for dizziness and headaches.  Psychiatric/Behavioral: Negative for depression and sleep disturbance. The patient has insomnia.      Today's Vitals   03/01/19 1206  BP: (!) 162/100  Pulse: 68  Temp: 98.1 F (36.7 C)  TempSrc: Oral  Weight: 157 lb 3.2 oz (71.3 kg)  Height: 5' 3.4" (1.61 m)  PainSc: 0-No pain   Body mass index is 27.5 kg/m.   Objective:  Physical Exam Vitals signs reviewed.  Constitutional:      Appearance: Normal appearance. She is well-developed.  Eyes:     Pupils: Pupils are equal, round, and reactive to light.  Cardiovascular:     Rate and Rhythm: Normal rate and regular  rhythm.     Pulses: Normal pulses.     Heart sounds: Normal heart sounds. No murmur.  Pulmonary:     Effort: Pulmonary effort is normal.     Breath sounds: Normal breath sounds.  Skin:    General: Skin is warm and dry.     Capillary Refill: Capillary refill takes less than 2 seconds.  Neurological:     General: No focal deficit present.     Mental Status: She is alert and oriented to person, place, and time.     Cranial Nerves: No cranial nerve deficit.  Psychiatric:        Mood and Affect: Mood normal.         Assessment And Plan:     1. Mixed hyperlipidemia  Chronic, fair control  Continue with current medications  She has a physical next month with Dr. Baird Cancer  2. Hot flashes  Controlled better at night when she takes the temazepam  Refilled temazepam  3. Bilateral foot pain  Likely plantar fascitis has mild tenderness to palpation to distal area of arch  Worse when getting up in am and when sitting for long periods  Encouraged to do arch exercises and to use frozen water bottle to soothe  4. Need for influenza vaccination  Influenza vaccine given in office  Advised to take Tylenol as needed for muscle aches or fever - Flu vaccine HIGH DOSE PF (Fluzone High dose)  5. Essential hypertension  Chronic, elevated she admits to not have taken her medication today  She is advised to take her blood pressure medication once she gets home  6. Insomnia, unspecified type  Chronic, stable.  Continue with current medications  Minette Brine, FNP    THE PATIENT IS ENCOURAGED TO PRACTICE SOCIAL DISTANCING DUE TO THE COVID-19 PANDEMIC.

## 2019-03-01 NOTE — Patient Instructions (Signed)
Plantar Fasciitis  Plantar fasciitis is a painful foot condition that affects the heel. It occurs when the band of tissue that connects the toes to the heel bone (plantar fascia) becomes irritated. This can happen as the result of exercising too much or doing other repetitive activities (overuse injury). The pain from plantar fasciitis can range from mild irritation to severe pain that makes it difficult to walk or move. The pain is usually worse in the morning after sleeping, or after sitting or lying down for a while. Pain may also be worse after long periods of walking or standing. What are the causes? This condition may be caused by:  Standing for long periods of time.  Wearing shoes that do not have good arch support.  Doing activities that put stress on joints (high-impact activities), including running, aerobics, and ballet.  Being overweight.  An abnormal way of walking (gait).  Tight muscles in the back of your lower leg (calf).  High arches in your feet.  Starting a new athletic activity. What are the signs or symptoms? The main symptom of this condition is heel pain. Pain may:  Be worse with first steps after a time of rest, especially in the morning after sleeping or after you have been sitting or lying down for a while.  Be worse after long periods of standing still.  Decrease after 30-45 minutes of activity, such as gentle walking. How is this diagnosed? This condition may be diagnosed based on your medical history and your symptoms. Your health care provider may ask questions about your activity level. Your health care provider will do a physical exam to check for:  A tender area on the bottom of your foot.  A high arch in your foot.  Pain when you move your foot.  Difficulty moving your foot. You may have imaging tests to confirm the diagnosis, such as:  X-rays.  Ultrasound.  MRI. How is this treated? Treatment for plantar fasciitis depends on how  severe your condition is. Treatment may include:  Rest, ice, applying pressure (compression), and raising the affected foot (elevation). This may be called RICE therapy. Your health care provider may recommend RICE therapy along with over-the-counter pain medicines to manage your pain.  Exercises to stretch your calves and your plantar fascia.  A splint that holds your foot in a stretched, upward position while you sleep (night splint).  Physical therapy to relieve symptoms and prevent problems in the future.  Injections of steroid medicine (cortisone) to relieve pain and inflammation.  Stimulating your plantar fascia with electrical impulses (extracorporeal shock wave therapy). This is usually the last treatment option before surgery.  Surgery, if other treatments have not worked after 12 months. Follow these instructions at home:  Managing pain, stiffness, and swelling  If directed, put ice on the painful area: ? Put ice in a plastic bag, or use a frozen bottle of water. ? Place a towel between your skin and the bag or bottle. ? Roll the bottom of your foot over the bag or bottle. ? Do this for 20 minutes, 2-3 times a day.  Wear athletic shoes that have air-sole or gel-sole cushions, or try wearing soft shoe inserts that are designed for plantar fasciitis.  Raise (elevate) your foot above the level of your heart while you are sitting or lying down. Activity  Avoid activities that cause pain. Ask your health care provider what activities are safe for you.  Do physical therapy exercises and stretches as told   by your health care provider.  Try activities and forms of exercise that are easier on your joints (low-impact). Examples include swimming, water aerobics, and biking. General instructions  Take over-the-counter and prescription medicines only as told by your health care provider.  Wear a night splint while sleeping, if told by your health care provider. Loosen the splint  if your toes tingle, become numb, or turn cold and blue.  Maintain a healthy weight, or work with your health care provider to lose weight as needed.  Keep all follow-up visits as told by your health care provider. This is important. Contact a health care provider if you:  Have symptoms that do not go away after caring for yourself at home.  Have pain that gets worse.  Have pain that affects your ability to move or do your daily activities. Summary  Plantar fasciitis is a painful foot condition that affects the heel. It occurs when the band of tissue that connects the toes to the heel bone (plantar fascia) becomes irritated.  The main symptom of this condition is heel pain that may be worse after exercising too much or standing still for a long time.  Treatment varies, but it usually starts with rest, ice, compression, and elevation (RICE therapy) and over-the-counter medicines to manage pain. This information is not intended to replace advice given to you by your health care provider. Make sure you discuss any questions you have with your health care provider. Document Released: 03/02/2001 Document Revised: 05/20/2017 Document Reviewed: 04/04/2017 Elsevier Patient Education  2020 [place] [employer]. Plantar Fasciitis Rehab Ask your health care provider which exercises are safe for you. Do exercises exactly as told by your health care provider and adjust them as directed. It is normal to feel mild stretching, pulling, tightness, or discomfort as you do these exercises. Stop right away if you feel sudden pain or your pain gets worse. Do not begin these exercises until told by your health care provider. Stretching and range-of-motion exercises These exercises warm up your muscles and joints and improve the movement and flexibility of your foot. These exercises also help to relieve pain. Plantar fascia stretch  1. Sit with your left / right leg crossed over your opposite knee. 2. Hold your heel  with one hand with that thumb near your arch. With your other hand, hold your toes and gently pull them back toward the top of your foot. You should feel a stretch on the bottom of your toes or your foot (plantar fascia) or both. 3. Hold this stretch for__________ seconds. 4. Slowly release your toes and return to the starting position. Repeat __________ times. Complete this exercise __________ times a day. Gastrocnemius stretch, standing This exercise is also called a calf (gastroc) stretch. It stretches the muscles in the back of the upper calf. 1. Stand with your hands against a wall. 2. Extend your left / right leg behind you, and bend your front knee slightly. 3. Keeping your heels on the floor and your back knee straight, shift your weight toward the wall. Do not arch your back. You should feel a gentle stretch in your upper left / right calf. 4. Hold this position for __________ seconds. Repeat __________ times. Complete this exercise __________ times a day. Soleus stretch, standing This exercise is also called a calf (soleus) stretch. It stretches the muscles in the back of the lower calf. 1. Stand with your hands against a wall. 2. Extend your left / right leg behind you, and bend  your front knee slightly. 3. Keeping your heels on the floor, bend your back knee and shift your weight slightly over your back leg. You should feel a gentle stretch deep in your lower calf. 4. Hold this position for __________ seconds. Repeat __________ times. Complete this exercise __________ times a day. Gastroc and soleus stretch, standing step This exercise stretches the muscles in the back of the lower leg. These muscles are in the upper calf (gastrocnemius) and the lower calf (soleus). 1. Stand with the ball of your left / right foot on a step. The ball of your foot is on the walking surface, right under your toes. 2. Keep your other foot firmly on the same step. 3. Hold on to the wall or a railing for  balance. 4. Slowly lift your other foot, allowing your body weight to press your left / right heel down over the edge of the step. You should feel a stretch in your left / right calf. 5. Hold this position for __________ seconds. 6. Return both feet to the step. 7. Repeat this exercise with a slight bend in your left / right knee. Repeat __________ times with your left / right knee straight and __________ times with your left / right knee bent. Complete this exercise __________ times a day. Balance exercise This exercise builds your balance and strength control of your arch to help take pressure off your plantar fascia. Single leg stand If this exercise is too easy, you can try it with your eyes closed or while standing on a pillow. 1. Without shoes, stand near a railing or in a doorway. You may hold on to the railing or door frame as needed. 2. Stand on your left / right foot. Keep your big toe down on the floor and try to keep your arch lifted. Do not let your foot roll inward. 3. Hold this position for __________ seconds. Repeat __________ times. Complete this exercise __________ times a day. This information is not intended to replace advice given to you by your health care provider. Make sure you discuss any questions you have with your health care provider. Document Released: 06/07/2005 Document Revised: 09/28/2018 Document Reviewed: 04/05/2018 Elsevier Patient Education  2020 [employer].

## 2019-03-06 ENCOUNTER — Telehealth: Payer: Self-pay | Admitting: Internal Medicine

## 2019-03-06 ENCOUNTER — Other Ambulatory Visit: Payer: Self-pay | Admitting: Internal Medicine

## 2019-03-06 NOTE — Chronic Care Management (AMB) (Signed)
Chronic Care Management   Note  03/06/2019 Name: Jessica Crosby MRN: [id number] DOB: 1950/09/28  Jessica Crosby is a 68 y.o. year old female who is a primary care patient of Glendale Chard, MD. I reached out to [place] Sizer by phone today in response to a referral sent by [name]'s health plan.    [name] was given information about Chronic Care Management services today including:  1. CCM service includes personalized support from designated clinical staff supervised by her physician, including individualized plan of care and coordination with other care providers 2. 24/7 contact phone numbers for assistance for urgent and routine care needs. 3. Service will only be billed when office clinical staff spend 20 minutes or more in a month to coordinate care. 4. Only one practitioner may furnish and bill the service in a calendar month. 5. The patient may stop CCM services at any time (effective at the end of the month) by phone call to the office staff. 6. The patient will be responsible for cost sharing (co-pay) of up to 20% of the service fee (after annual deductible is met).  Patient agreed to services and verbal consent obtained.   Follow up plan: Telephone appointment with CCM team member scheduled for: 03/21/2019  [place]  [email or web address]   ??[phone]

## 2019-03-06 NOTE — Chronic Care Management (AMB) (Signed)
°  Chronic Care Management   Outreach Note  03/06/2019 Name: Jessica Crosby MRN: [id number] DOB: 05-15-1951  Referred by: Glendale Chard, MD Reason for referral : Chronic Care Management (Initial CCM outreach was unsuccessful.)   An unsuccessful telephone outreach was attempted today. The patient was referred to the case management team by for assistance with chronic care management and care coordination.   Follow Up Plan: A HIPPA compliant phone message was left for the patient providing contact information and requesting a return call.  The care management team will reach out to the patient again over the next 7 days.  If patient returns call to provider office, please advise to call [place] at [place]  [email or web address]   ??[phone]

## 2019-03-07 ENCOUNTER — Other Ambulatory Visit: Payer: PPO

## 2019-03-08 ENCOUNTER — Other Ambulatory Visit: Payer: Self-pay | Admitting: Internal Medicine

## 2019-03-19 ENCOUNTER — Telehealth: Payer: Self-pay | Admitting: Internal Medicine

## 2019-03-19 NOTE — Telephone Encounter (Signed)
I left a message asking the pt to call me at [phone] to reschedule AWV with [place]. VDM (DD)

## 2019-03-20 ENCOUNTER — Other Ambulatory Visit: Payer: Self-pay

## 2019-03-20 ENCOUNTER — Ambulatory Visit (INDEPENDENT_AMBULATORY_CARE_PROVIDER_SITE_OTHER): Payer: PPO

## 2019-03-20 ENCOUNTER — Telehealth: Payer: Self-pay

## 2019-03-20 DIAGNOSIS — I10 Essential (primary) hypertension: Secondary | ICD-10-CM

## 2019-03-20 DIAGNOSIS — E782 Mixed hyperlipidemia: Secondary | ICD-10-CM | POA: Diagnosis not present

## 2019-03-20 DIAGNOSIS — E213 Hyperparathyroidism, unspecified: Secondary | ICD-10-CM

## 2019-03-20 DIAGNOSIS — R232 Flushing: Secondary | ICD-10-CM

## 2019-03-21 ENCOUNTER — Telehealth: Payer: PPO

## 2019-03-21 NOTE — Patient Instructions (Signed)
Visit Information  Goals Addressed      Patient Stated   . "I am having some shortness of breath" (pt-stated)       Current Barriers:  [name] Knowledge Deficits related to diagnosis and treatment of shortness of breath  Nurse Case Manager Clinical Goal(s):  [name] Over the next 60 days, patient will work with PCP to address needs related to shortness of breath  CCM RN CM Interventions:  03/20/19 call completed with patient   . Evaluation of current treatment plan related to shortness of breath and patient's adherence to plan as established by provider. . Advised patient to report these symptoms to Dr. Baird Cancer at next scheduled OV set for 03/28/19  . Provided education to patient re: the benefits of using pursed lip breathing exercises to help move trapped air and or control breathing ; patient instructed on how to perform pursed lip breathing exercises; patient verbalizes understanding; patient sates this has been an ongoing symptom that started about 1 year ago but has progressed and is occurring more consistently; patient denies having past pulmonary history; patient denies having ever smoked, she was exposed to second hand smoke about 30 years ago  . Collaborated with Dr. Baird Cancer  regarding patient c/o shortness of breath with activity and conversation  . Discussed plans with patient for ongoing care management follow up and provided patient with direct contact information for care management team  Patient Self Care Activities:  . Self administers medications as prescribed . Attends all scheduled provider appointments . Calls pharmacy for medication refills . Attends church or other social activities . Performs ADL's independently . Performs IADL's independently . Calls provider office for new concerns or questions   Initial goal documentation     . "I have a thyroid ultrasound scheduled" (pt-stated)       Current Barriers:  [name] Knowledge Deficits related to diagnosis and treatment for  thyroid nodule  Nurse Case Manager Clinical Goal(s):  [name] Over the next 30 days, patient will verbalize understanding of plan for diagnosis and treatment of thyroid nodule; patient will complete having a thyroid ultrasound completed on 04/02/19  CCM RN CM Interventions:  03/20/19 call completed with patient   . Evaluation of current treatment plan related to thyroid nodule and patient's adherence to plan as established by provider. . Provided education to patient re: increased risk for Vitamin D deficiency secondary to thyroid disease; discussed the risks of Vitamin D deficiency including increased risk for heart disease, high blood pressure, diabetes, infections and immune disorders, bone loss, cancer; educated patient on the sources in which to obtain Vitamin D including through diet, oral supplementation and being exposed to the sun for at least 15-20 minutes 3 days per week   . Reviewed medications with patient and discussed indication, frequency, and dosage of prescribed Vitamin D supplement; assessed for adherence; patient is taking as directed w/o missed doses  . Collaborated with Dr. Baird Cancer regarding request to check a Vitamin D level at next visit  . Discussed plans with patient for ongoing care management follow up and provided patient with direct contact information for care management team  Patient Self Care Activities:  . Self administers medications as prescribed . Attends all scheduled provider appointments . Calls pharmacy for medication refills . Attends church or other social activities . Performs ADL's independently . Performs IADL's independently . Calls provider office for new concerns or questions  Initial goal documentation     . "I would like to get my hot  flashes under control" (pt-stated)       Current Barriers:  [name] Knowledge Deficits related to treatment management for hot flashes associated with post Menopause  Nurse Case Manager Clinical Goal(s):  [name] Over the  next 30 days, patient will work with the CCM team to address needs related to effective treatment management for post menopause hot flashes  CCM RN CM Interventions:  03/20/19 call completed with patient   . Evaluation of current treatment plan related to post menopausal hot flashes and patient's adherence to plan as established by provider. . Reviewed medications with patient and discussed past failed treatments for treatment of hot flashes . Collaborated with embedded Pharm D Lottie Dawson regarding patient's request for pharmacological and or homeopathic treatment for post menopausal hot flashes . Discussed plans with patient for ongoing care management follow up and provided patient with direct contact information for care management team  Patient Self Care Activities:  . Self administers medications as prescribed . Attends all scheduled provider appointments . Calls pharmacy for medication refills . Attends church or other social activities . Performs ADL's independently . Performs IADL's independently . Calls provider office for new concerns or questions  Initial goal documentation      . "I would like to lose some weight" (pt-stated)       Current Barriers:  [name] Knowledge Deficits related to safe and effective weight loss . Decreased mobility and activity due to COVID restrictions   Nurse Case Manager Clinical Goal(s):  [name] Over the next 90 days, patient will work with [place] to address needs related to safe and effective weight management   CCM RN CM Interventions:  03/20/19 call completed with patient   . Evaluation of current treatment plan related to weight management and patient's adherence to plan as established by provider . Provided patient with positive reinforcement for deciding she would like to make the appropriate lifestyle changes including eating healthier and increasing her activity to help with weight management  . Discussed patient's current BMI and target  goal for weight loss to reach 140 lbs . Mailed the printed patient educational materials related to weight management; "Losing Weight: Getting Started", "Improving Your Eating Habits", "Losing Weight" . Discussed plans with patient for ongoing care management follow up and provided patient with direct contact information for care management team  Patient Self Care Activities:  . Self administers medications as prescribed . Attends all scheduled provider appointments . Calls pharmacy for medication refills . Attends church or other social activities . Performs ADL's independently . Performs IADL's independently . Calls provider office for new concerns or questions  Initial goal documentation     . "I would like to start checking my BP at home" (pt-stated)       Current Barriers:  [name] Knowledge Deficits related to disease process and Self Health Management for HTN  Nurse Case Manager Clinical Goal(s):  [name] Over the next 30 days, patient will work with Consulting civil engineer to address [place] of her High Blood Pressure . Over the next 30 days, patient will report home BP readings are within target range of 130/80  CCM RN CM Interventions:  03/20/19 call completed with patient  . Evaluation of current treatment plan related to Hypertension and patient's adherence to plan as established by provider. . Advised patient to monitor her BP at home daily and record her readings; instructed patient to bring this log in with her next OV with Dr. Baird Cancer to review . Provided  education to patient re: the importance of taking all prescribed medications exactly as prescribed without missed doses for best results and to avoid hypertensive crisis; educated on importance to adhering to a low sodium diet and to implement at least 150 minutes of exercise daily; discussed target BP of 130/80 unless otherwise directed by Dr. Baird Cancer . Reviewed medications with patient and discussed indication, dosage and  frequency of prescribed medications; medication reconciliation completed with no discrepancies noted . Discussed plans with patient for ongoing care management follow up and provided patient with direct contact information for care management team . Provided patient with printed educational materials related to My Blood Pressure Log  Patient Self Care Activities:  . Self administers medications as prescribed . Attends all scheduled provider appointments . Calls pharmacy for medication refills . Attends church or other social activities . Performs ADL's independently . Performs IADL's independently . Calls provider office for new concerns or questions   Initial goal documentation        The patient verbalized understanding of instructions provided today and declined a print copy of patient instruction materials.   Telephone follow up appointment with care management team member scheduled for: 04/10/19  Barb Merino, RN, BSN, CCM Care Management Coordinator [facility]  Direct Phone: [phone]

## 2019-03-21 NOTE — Chronic Care Management (AMB) (Signed)
Chronic Care Management   Initial Visit Note  03/20/2019 Name: Jessica Crosby MRN: [id number] DOB: 01/30/1951  Referred by: Glendale Chard, MD Reason for referral : Chronic Care Management (INITIAL CCM RNCM Telephone Outreach )   Jessica Crosby is a 68 y.o. year old female who is a primary care patient of Glendale Chard, MD. The CCM team was consulted for assistance with chronic disease management and care coordination needs.   Review of patient status, including review of consultants reports, relevant laboratory and other test results, and collaboration with appropriate care team members and the patient's provider was performed as part of comprehensive patient evaluation and provision of chronic care management services.    SDOH (Social Determinants of Health) screening performed today: None. See Care Plan for related entries.   Advanced Directives Status: N See Care Plan and [employer] application for related entries.   I spoke with Jessica Crosby by telephone today to assess for CCM RN CM needs and a care plan was established.   Medications: Outpatient Encounter Medications as of 03/20/2019  Medication Sig Note  . aspirin 81 MG tablet Take 81 mg by mouth daily.   [name] atorvastatin (LIPITOR) 20 MG tablet TAKE 1 TABLET BY MOUTH EVERY DAY 03/20/2019: Patient is taking only on Monday, Wednesday and Friday per Dr. Baird Cancer  . calcium carbonate (OS-CAL) 600 MG TABS Take 600 mg by mouth 2 (two) times daily with a meal.   . cetirizine (ZYRTEC) 10 MG tablet Take 10 mg by mouth daily.   . Cholecalciferol (VITAMIN D PO) Take 1,000 mg by mouth daily.    [name] losartan-hydrochlorothiazide (HYZAAR) 100-12.5 MG tablet TAKE 1 TABLET BY MOUTH EVERY DAY   . Magnesium 250 MG TABS Take by mouth. 06/04/2015: Received from: [facility]  . Multiple Vitamin (THERA) TABS Take 1 tablet by mouth daily. 06/09/2016: Received from: [facility]: Take 1 tablet by mouth daily.  . Omega-3 Fatty  Acids (FISH OIL) 1000 MG CAPS Take 1 capsule by mouth. 06/04/2015: Received from: [facility]  . temazepam (RESTORIL) 15 MG capsule Take 1 capsule (15 mg total) by mouth at bedtime as needed for sleep.   . hydrochlorothiazide (MICROZIDE) 12.5 MG capsule Take 1 capsule (12.5 mg total) by mouth daily. (Patient not taking: Reported on 03/20/2019)   . losartan (COZAAR) 100 MG tablet Take 1 tablet (100 mg total) by mouth daily. (Patient not taking: Reported on 03/20/2019)    No facility-administered encounter medications on file as of 03/20/2019.      Objective:  No results found for: HGBA1C Lab Results  Component Value Date   CREATININE 0.80 04/22/2009   BP Readings from Last 3 Encounters:  03/01/19 (!) 162/100  09/07/18 120/70  06/22/18 104/64    Goals Addressed      Patient Stated   . "I am having some shortness of breath" (pt-stated)       Current Barriers:  [name] Knowledge Deficits related to diagnosis and treatment of shortness of breath  Nurse Case Manager Clinical Goal(s):  [name] Over the next 60 days, patient will work with PCP to address needs related to shortness of breath  CCM RN CM Interventions:  03/20/19 call completed with patient   . Evaluation of current treatment plan related to shortness of breath and patient's adherence to plan as established by provider. . Advised patient to report these symptoms to Dr. Baird Cancer at next scheduled OV set for 03/28/19  . Provided education to patient re: the benefits of  using pursed lip breathing exercises to help move trapped air and or control breathing ; patient instructed on how to perform pursed lip breathing exercises; patient verbalizes understanding; patient sates this has been an ongoing symptom that started about 1 year ago but has progressed and is occurring more consistently; patient denies having past pulmonary history; patient denies having ever smoked, she was exposed to second hand smoke about 30 years ago  . Collaborated with  Dr. Baird Cancer  regarding patient c/o shortness of breath with activity and conversation  . Discussed plans with patient for ongoing care management follow up and provided patient with direct contact information for care management team  Patient Self Care Activities:  . Self administers medications as prescribed . Attends all scheduled provider appointments . Calls pharmacy for medication refills . Attends church or other social activities . Performs ADL's independently . Performs IADL's independently . Calls provider office for new concerns or questions   Initial goal documentation     . "I have a thyroid ultrasound scheduled" (pt-stated)       Current Barriers:  [name] Knowledge Deficits related to diagnosis and treatment for thyroid nodule  Nurse Case Manager Clinical Goal(s):  [name] Over the next 30 days, patient will verbalize understanding of plan for diagnosis and treatment of thyroid nodule; patient will complete having a thyroid ultrasound completed on 04/02/19  CCM RN CM Interventions:  03/20/19 call completed with patient   . Evaluation of current treatment plan related to thyroid nodule and patient's adherence to plan as established by provider. . Provided education to patient re: increased risk for Vitamin D deficiency secondary to thyroid disease; discussed the risks of Vitamin D deficiency including increased risk for heart disease, high blood pressure, diabetes, infections and immune disorders, bone loss, cancer; educated patient on the sources in which to obtain Vitamin D including through diet, oral supplementation and being exposed to the sun for at least 15-20 minutes 3 days per week   . Reviewed medications with patient and discussed indication, frequency, and dosage of prescribed Vitamin D supplement; assessed for adherence; patient is taking as directed w/o missed doses  . Collaborated with Dr. Baird Cancer regarding request to check a Vitamin D level at next visit  . Discussed  plans with patient for ongoing care management follow up and provided patient with direct contact information for care management team  Patient Self Care Activities:  . Self administers medications as prescribed . Attends all scheduled provider appointments . Calls pharmacy for medication refills . Attends church or other social activities . Performs ADL's independently . Performs IADL's independently . Calls provider office for new concerns or questions  Initial goal documentation     . "I would like to get my hot flashes under control" (pt-stated)       Current Barriers:  [name] Knowledge Deficits related to treatment management for hot flashes associated with post Menopause  Nurse Case Manager Clinical Goal(s):  [name] Over the next 30 days, patient will work with the CCM team to address needs related to effective treatment management for post menopause hot flashes  CCM RN CM Interventions:  03/20/19 call completed with patient   . Evaluation of current treatment plan related to post menopausal hot flashes and patient's adherence to plan as established by provider. . Reviewed medications with patient and discussed past failed treatments for treatment of hot flashes . Collaborated with embedded Pharm D Lottie Dawson regarding patient's request for pharmacological and or homeopathic treatment for post menopausal  hot flashes . Discussed plans with patient for ongoing care management follow up and provided patient with direct contact information for care management team  Patient Self Care Activities:  . Self administers medications as prescribed . Attends all scheduled provider appointments . Calls pharmacy for medication refills . Attends church or other social activities . Performs ADL's independently . Performs IADL's independently . Calls provider office for new concerns or questions  Initial goal documentation      . "I would like to lose some weight" (pt-stated)       Current  Barriers:  [name] Knowledge Deficits related to safe and effective weight loss . Decreased mobility and activity due to COVID restrictions   Nurse Case Manager Clinical Goal(s):  [name] Over the next 90 days, patient will work with [place] to address needs related to safe and effective weight management   CCM RN CM Interventions:  03/20/19 call completed with patient   . Evaluation of current treatment plan related to weight management and patient's adherence to plan as established by provider . Provided patient with positive reinforcement for deciding she would like to make the appropriate lifestyle changes including eating healthier and increasing her activity to help with weight management  . Discussed patient's current BMI and target goal for weight loss to reach 140 lbs . Mailed the printed patient educational materials related to weight management; "Losing Weight: Getting Started", "Improving Your Eating Habits", "Losing Weight" . Discussed plans with patient for ongoing care management follow up and provided patient with direct contact information for care management team  Patient Self Care Activities:  . Self administers medications as prescribed . Attends all scheduled provider appointments . Calls pharmacy for medication refills . Attends church or other social activities . Performs ADL's independently . Performs IADL's independently . Calls provider office for new concerns or questions  Initial goal documentation     . "I would like to start checking my BP at home" (pt-stated)       Current Barriers:  [name] Knowledge Deficits related to disease process and Self Health Management for HTN  Nurse Case Manager Clinical Goal(s):  [name] Over the next 30 days, patient will work with Consulting civil engineer to address [place] of her High Blood Pressure . Over the next 30 days, patient will report home BP readings are within target range of 130/80  CCM RN CM Interventions:  03/20/19 call  completed with patient  . Evaluation of current treatment plan related to Hypertension and patient's adherence to plan as established by provider. . Advised patient to monitor her BP at home daily and record her readings; instructed patient to bring this log in with her next OV with Dr. Baird Cancer to review . Provided education to patient re: the importance of taking all prescribed medications exactly as prescribed without missed doses for best results and to avoid hypertensive crisis; educated on importance to adhering to a low sodium diet and to implement at least 150 minutes of exercise daily; discussed target BP of 130/80 unless otherwise directed by Dr. Baird Cancer . Reviewed medications with patient and discussed indication, dosage and frequency of prescribed medications; medication reconciliation completed with no discrepancies noted . Discussed plans with patient for ongoing care management follow up and provided patient with direct contact information for care management team . Provided patient with printed educational materials related to My Blood Pressure Log  Patient Self Care Activities:  . Self administers medications as prescribed . Attends all scheduled provider  appointments . Calls pharmacy for medication refills . Attends church or other social activities . Performs ADL's independently . Performs IADL's independently . Calls provider office for new concerns or questions   Initial goal documentation        Plan:   Telephone follow up appointment with care management team member scheduled for: 04/10/19   Barb Merino, RN, BSN, CCM Care Management Coordinator [place] [facility]  Direct Phone: [phone]

## 2019-03-22 ENCOUNTER — Other Ambulatory Visit: Payer: Self-pay

## 2019-03-22 ENCOUNTER — Ambulatory Visit (INDEPENDENT_AMBULATORY_CARE_PROVIDER_SITE_OTHER): Payer: PPO

## 2019-03-22 VITALS — BP 140/91 | Ht 65.0 in | Wt 154.0 lb

## 2019-03-22 DIAGNOSIS — Z0000 Encounter for general adult medical examination without abnormal findings: Secondary | ICD-10-CM | POA: Diagnosis not present

## 2019-03-22 NOTE — Progress Notes (Signed)
Subjective:   Jessica Crosby is a 68 y.o. female who presents for Medicare Annual (Subsequent) preventive examination.  This visit type was conducted due to national recommendations for restrictions regarding the COVID-19 Pandemic (e.g. social distancing). This format is felt to be most appropriate for this patient at this time. All issues noted in this document were discussed and addressed. No physical exam was performed (except for noted visual exam findings with Video Visits). The patient, [name], has given consent to perform this visit via video. Vital signs may be absent or patient reported.   Patient location:  At home   Nurse location:  [place] office   Review of Systems:  n/a Cardiac Risk Factors include: advanced age (>50men, >22 women);hypertension;dyslipidemia     Objective:     Vitals: BP (!) 140/91 Comment: per patient had not taken BP meds   Ht 5\' 5"  (1.651 m) Comment: per patient   Wt 154 lb (69.9 kg) Comment: per patient   LMP 06/21/1998    BMI 25.63 kg/m   Body mass index is 25.63 kg/m.  Advanced Directives 03/22/2019  Does Patient Have a Medical Advance Directive? No    Tobacco Social History   Tobacco Use  Smoking Status Never Smoker  Smokeless Tobacco Never Used     Counseling given: Not Answered   Clinical Intake:  Pre-visit preparation completed: Yes  Pain : No/denies pain     Nutritional Status: BMI 25 -29 Overweight Nutritional Risks: None Diabetes: No  How often do you need to have someone help you when you read instructions, pamphlets, or other written materials from your doctor or pharmacy?: 1 - Never What is the last grade level you completed in school?: business degree  Interpreter Needed?: No  Information entered by :: NAllen LPN  Past Medical History:  Diagnosis Date   Arthritis    GERD (gastroesophageal reflux disease)    Hx of scarlet fever    as achild   Hypertension    Thyroid disease    hyperparathyroidism   Vitamin D deficiency    Past Surgical History:  Procedure Laterality Date   CHOLECYSTECTOMY  1999   PARATHYROIDECTOMY Right 2010   Family History  Problem Relation Age of Onset   Breast cancer Sister 23   Prostate cancer Father 93   Breast cancer Maternal Aunt 81   Prostate cancer Paternal Uncle    Throat cancer Paternal Grandfather        pipe smoker   Breast cancer Sister 70   Lymphoma Sister 61   Leukemia Sister 60       as a result of chemotherapy from her lymphoma   Cancer Sister 62       non hodgins lymphoma/ leukemia   Pancreatic cancer Maternal Aunt 44   Colon cancer Maternal Aunt        diagnosed in her 5s   Thyroid cancer Maternal Aunt        dx in her 83s   Leukemia Maternal Aunt        diagnosed in her 38s   Breast cancer Cousin        diagnosed in her 70s   Prostate cancer Paternal Uncle    Social History   Socioeconomic History   Marital status: Married    Spouse name: Not on file   Number of children: Not on file   Years of education: Not on file   Highest education level: Not on file  Occupational History  Not on file  Social Needs   Financial resource strain: Not hard at all   Food insecurity    Worry: Never true    Inability: Never true   Transportation needs    Medical: No    Non-medical: No  Tobacco Use   Smoking status: Never Smoker   Smokeless tobacco: Never Used  Substance and Sexual Activity   Alcohol use: No   Drug use: No   Sexual activity: Yes    Partners: Male    Birth control/protection: Post-menopausal  Lifestyle   Physical activity    Days per week: 7 days    Minutes per session: 60 min   Stress: Not at all  Relationships   Social connections    Talks on phone: Not on file    Gets together: Not on file    Attends religious service: Not on file    Active member of club or organization: Not on file    Attends meetings of clubs or organizations: Not on file     Relationship status: Not on file  Other Topics Concern   Not on file  Social History Narrative   Not on file    Outpatient Encounter Medications as of 03/22/2019  Medication Sig   aspirin 81 MG tablet Take 81 mg by mouth daily.   atorvastatin (LIPITOR) 20 MG tablet TAKE 1 TABLET BY MOUTH EVERY DAY   calcium carbonate (OS-CAL) 600 MG TABS Take 600 mg by mouth 2 (two) times daily with a meal.   cetirizine (ZYRTEC) 10 MG tablet Take 10 mg by mouth daily.   Cholecalciferol (VITAMIN D PO) Take 1,000 mg by mouth daily.    losartan-hydrochlorothiazide (HYZAAR) 100-12.5 MG tablet TAKE 1 TABLET BY MOUTH EVERY DAY   Magnesium 250 MG TABS Take by mouth.   Multiple Vitamin (THERA) TABS Take 1 tablet by mouth daily.   Omega-3 Fatty Acids (FISH OIL) 1000 MG CAPS Take 1 capsule by mouth.   temazepam (RESTORIL) 15 MG capsule Take 1 capsule (15 mg total) by mouth at bedtime as needed for sleep.   hydrochlorothiazide (MICROZIDE) 12.5 MG capsule Take 1 capsule (12.5 mg total) by mouth daily. (Patient not taking: Reported on 03/20/2019)   losartan (COZAAR) 100 MG tablet Take 1 tablet (100 mg total) by mouth daily. (Patient not taking: Reported on 03/20/2019)   No facility-administered encounter medications on file as of 03/22/2019.     Activities of Daily Living In your present state of health, do you have any difficulty performing the following activities: 03/22/2019  Hearing? N  Vision? N  Difficulty concentrating or making decisions? N  Walking or climbing stairs? N  Dressing or bathing? N  Doing errands, shopping? N  Preparing Food and eating ? N  Using the Toilet? N  In the past six months, have you accidently leaked urine? N  Do you have problems with loss of bowel control? N  Managing your Medications? N  Managing your Finances? N  Housekeeping or managing your Housekeeping? N  Some recent data might be hidden    Patient Care Team: Glendale Chard, MD as PCP - General (Internal  Medicine) Rex Kras, Claudette Stapler, RN as [facility]    Assessment:   This is a routine wellness examination for Jessica Crosby.  Exercise Activities and Dietary recommendations Current Exercise Habits: Home exercise routine, Type of exercise: walking, Time (Minutes): 60, Frequency (Times/Week): 7, Weekly Exercise (Minutes/Week): 420  Goals     "I am having some  shortness of breath" (pt-stated)     Current Barriers:   Knowledge Deficits related to diagnosis and treatment of shortness of breath  Nurse Case Manager Clinical Goal(s):   Over the next 60 days, patient will work with PCP to address needs related to shortness of breath  CCM RN CM Interventions:  03/20/19 call completed with patient    Evaluation of current treatment plan related to shortness of breath and patient's adherence to plan as established by provider.  Advised patient to report these symptoms to Dr. Baird Cancer at next scheduled OV set for 03/28/19   Provided education to patient re: the benefits of using pursed lip breathing exercises to help move trapped air and or control breathing ; patient instructed on how to perform pursed lip breathing exercises; patient verbalizes understanding; patient sates this has been an ongoing symptom that started about 1 year ago but has progressed and is occurring more consistently; patient denies having past pulmonary history; patient denies having ever smoked, she was exposed to second hand smoke about 30 years ago   Collaborated with Dr. Baird Cancer  regarding patient c/o shortness of breath with activity and conversation   Discussed plans with patient for ongoing care management follow up and provided patient with direct contact information for care management team  Patient Self Care Activities:   Self administers medications as prescribed  Attends all scheduled provider appointments  Calls pharmacy for medication refills  Attends church or other social  activities  Performs ADL's independently  Performs IADL's independently  Calls provider office for new concerns or questions   Initial goal documentation      "I have a thyroid ultrasound scheduled" (pt-stated)     Current Barriers:   Knowledge Deficits related to diagnosis and treatment for thyroid nodule  Nurse Case Manager Clinical Goal(s):   Over the next 30 days, patient will verbalize understanding of plan for diagnosis and treatment of thyroid nodule; patient will complete having a thyroid ultrasound completed on 04/02/19  CCM RN CM Interventions:  03/20/19 call completed with patient    Evaluation of current treatment plan related to thyroid nodule and patient's adherence to plan as established by provider.  Provided education to patient re: increased risk for Vitamin D deficiency secondary to thyroid disease; discussed the risks of Vitamin D deficiency including increased risk for heart disease, high blood pressure, diabetes, infections and immune disorders, bone loss, cancer; educated patient on the sources in which to obtain Vitamin D including through diet, oral supplementation and being exposed to the sun for at least 15-20 minutes 3 days per week    Reviewed medications with patient and discussed indication, frequency, and dosage of prescribed Vitamin D supplement; assessed for adherence; patient is taking as directed w/o missed doses   Collaborated with Dr. Baird Cancer regarding request to check a Vitamin D level at next visit   Discussed plans with patient for ongoing care management follow up and provided patient with direct contact information for care management team  Patient Self Care Activities:   Self administers medications as prescribed  Attends all scheduled provider appointments  Calls pharmacy for medication refills  Attends church or other social activities  Performs ADL's independently  Performs IADL's independently  Calls provider office for  new concerns or questions  Initial goal documentation      "I would like to get my hot flashes under control" (pt-stated)     Current Barriers:   Knowledge Deficits related to treatment management for hot flashes associated  with post Menopause  Nurse Case Manager Clinical Goal(s):   Over the next 30 days, patient will work with the CCM team to address needs related to effective treatment management for post menopause hot flashes  CCM RN CM Interventions:  03/20/19 call completed with patient    Evaluation of current treatment plan related to post menopausal hot flashes and patient's adherence to plan as established by provider.  Reviewed medications with patient and discussed past failed treatments for treatment of hot flashes  Collaborated with embedded Pharm D Lottie Dawson regarding patient's request for pharmacological and or homeopathic treatment for post menopausal hot flashes  Discussed plans with patient for ongoing care management follow up and provided patient with direct contact information for care management team  Patient Self Care Activities:   Self administers medications as prescribed  Attends all scheduled provider appointments  Calls pharmacy for medication refills  Attends church or other social activities  Performs ADL's independently  Performs IADL's independently  Calls provider office for new concerns or questions  Initial goal documentation       "I would like to lose some weight" (pt-stated)     Current Barriers:   Knowledge Deficits related to safe and effective weight loss  Decreased mobility and activity due to COVID restrictions   Nurse Case Manager Clinical Goal(s):   Over the next 90 days, patient will work with CCM RNCM to address needs related to safe and effective weight management   CCM RN CM Interventions:  03/20/19 call completed with patient    Evaluation of current treatment plan related to weight management and  patient's adherence to plan as established by provider  Provided patient with positive reinforcement for deciding she would like to make the appropriate lifestyle changes including eating healthier and increasing her activity to help with weight management   Discussed patient's current BMI and target goal for weight loss to reach 140 lbs  Mailed the printed patient educational materials related to weight management; "Losing Weight: Getting Started", "Improving Your Eating Habits", "Losing Weight"  Discussed plans with patient for ongoing care management follow up and provided patient with direct contact information for care management team  Patient Self Care Activities:   Self administers medications as prescribed  Attends all scheduled provider appointments  Calls pharmacy for medication refills  Attends church or other social activities  Performs ADL's independently  Performs IADL's independently  Calls provider office for new concerns or questions  Initial goal documentation      "I would like to start checking my BP at home" (pt-stated)     Current Barriers:   Knowledge Deficits related to disease process and Self Health Management for HTN  Nurse Case Manager Clinical Goal(s):   Over the next 30 days, patient will work with RN Care Manager to address [place] of her High Blood Pressure  Over the next 30 days, patient will report home BP readings are within target range of 130/80  CCM RN CM Interventions:  03/20/19 call completed with patient   Evaluation of current treatment plan related to Hypertension and patient's adherence to plan as established by provider.  Advised patient to monitor her BP at home daily and record her readings; instructed patient to bring this log in with her next OV with Dr. Baird Cancer to review  Provided education to patient re: the importance of taking all prescribed medications exactly as prescribed without missed doses for  best results and to avoid hypertensive crisis; educated on importance  to adhering to a low sodium diet and to implement at least 150 minutes of exercise daily; discussed target BP of 130/80 unless otherwise directed by Dr. Baird Cancer  Reviewed medications with patient and discussed indication, dosage and frequency of prescribed medications; medication reconciliation completed with no discrepancies noted  Discussed plans with patient for ongoing care management follow up and provided patient with direct contact information for care management team  Provided patient with printed educational materials related to My Blood Pressure Log  Patient Self Care Activities:   Self administers medications as prescribed  Attends all scheduled provider appointments  Calls pharmacy for medication refills  Attends church or other social activities  Performs ADL's independently  Performs IADL's independently  Calls provider office for new concerns or questions   Initial goal documentation      Weight (lb) < 200 lb (90.7 kg)     03/22/2019, wants to weigh 140 pounds       Fall Risk Fall Risk  03/22/2019 03/01/2019  Falls in the past year? 0 0  Number falls in past yr: 0 -  Risk for fall due to : Medication side effect -  Follow up Falls evaluation completed;Falls prevention discussed -   Is the patient's home free of loose throw rugs in walkways, pet beds, electrical cords, etc?   yes      Grab bars in the bathroom? no      Handrails on the stairs?   yes      Adequate lighting?   yes  Timed Get Up and Go performed: n/a  Depression Screen PHQ 2/9 Scores 03/22/2019 03/01/2019  PHQ - 2 Score 0 0     Cognitive Function     6CIT Screen 03/22/2019  What Year? 0 points  What month? 0 points  What time? 0 points  Count back from 20 0 points  Months in reverse 0 points  Repeat phrase 0 points  Total Score 0    Immunization History  Administered Date(s) Administered   DT 02/12/2003    Influenza, High Dose Seasonal PF 03/13/2018, 03/01/2019   Influenza, Seasonal, Injecte, Preservative Fre 04/29/2001, 05/11/2005, 03/30/2006, 05/12/2007, 04/01/2015   Influenza-Unspecified 03/26/2017   Pneumococcal Conjugate-13 05/26/2016   Tdap 03/04/2015   Zoster 06/22/2010    Qualifies for Shingles Vaccine? yes  Screening Tests Health Maintenance  Topic Date Due   Hepatitis C Screening  February 08, 1951   PNA vac Low Risk Adult (2 of 2 - PPSV23) 05/26/2017   MAMMOGRAM  08/16/2020   COLONOSCOPY  12/17/2020   TETANUS/TDAP  03/03/2025   INFLUENZA VACCINE  Completed   DEXA SCAN  Completed    Cancer Screenings: Lung: Low Dose CT Chest recommended if Age 66-80 years, 30 pack-year currently smoking OR have quit w/in 15years. Patient does not qualify. Breast:  Up to date on Mammogram? Yes   Up to date of Bone Density/Dexa? Yes Colorectal: up to date  Additional Screenings: : Hepatitis C Screening: due     Plan:    Patient wants to get down to 140 pounds.   I have personally reviewed and noted the following in the patients chart:    Medical and social history  Use of alcohol, tobacco or illicit drugs   Current medications and supplements  Functional ability and status  Nutritional status  Physical activity  Advanced directives  List of other physicians  Hospitalizations, surgeries, and ER visits in previous 12 months  Vitals  Screenings to include cognitive, depression, and falls  Referrals  and appointments  In addition, I have reviewed and discussed with patient certain preventive protocols, quality metrics, and best practice recommendations. A written personalized care plan for preventive services as well as general preventive health recommendations were provided to patient.     Kellie Simmering, LPN  [id number]

## 2019-03-22 NOTE — Patient Instructions (Signed)
[name] , Thank you for taking time to come for your Medicare Wellness Visit. I appreciate your ongoing commitment to your health goals. Please review the following plan we discussed and let me know if I can assist you in the future.   Screening recommendations/referrals: Colonoscopy: 11/2010 Mammogram: 07/2018 Bone Density: 07/2018 Recommended yearly ophthalmology/optometry visit for glaucoma screening and checkup Recommended yearly dental visit for hygiene and checkup  Vaccinations: Influenza vaccine: 02/2019 Pneumococcal vaccine: 05/2016 Tdap vaccine: 02/2012 Shingles vaccine: discussed    Advanced directives: Advance directive discussed with you today.   Conditions/risks identified: overweight  Next appointment: 03/28/2019 at 10:30   Preventive Care 48 Years and Older, Female Preventive care refers to lifestyle choices and visits with your health care provider that can promote health and wellness. What does preventive care include?  A yearly physical exam. This is also called an annual well check.  Dental exams once or twice a year.  Routine eye exams. Ask your health care provider how often you should have your eyes checked.  Personal lifestyle choices, including:  Daily care of your teeth and gums.  Regular physical activity.  Eating a healthy diet.  Avoiding tobacco and drug use.  Limiting alcohol use.  Practicing safe sex.  Taking low-dose aspirin every day.  Taking vitamin and mineral supplements as recommended by your health care provider. What happens during an annual well check? The services and screenings done by your health care provider during your annual well check will depend on your age, overall health, lifestyle risk factors, and family history of disease. Counseling  Your health care provider may ask you questions about your:  Alcohol use.  Tobacco use.  Drug use.  Emotional well-being.  Home and relationship well-being.  Sexual  activity.  Eating habits.  History of falls.  Memory and ability to understand (cognition).  Work and work Statistician.  Reproductive health. Screening  You may have the following tests or measurements:  Height, weight, and BMI.  Blood pressure.  Lipid and cholesterol levels. These may be checked every 5 years, or more frequently if you are over 74 years old.  Skin check.  Lung cancer screening. You may have this screening every year starting at age 12 if you have a 30-pack-year history of smoking and currently smoke or have quit within the past 15 years.  Fecal occult blood test (FOBT) of the stool. You may have this test every year starting at age 91.  Flexible sigmoidoscopy or colonoscopy. You may have a sigmoidoscopy every 5 years or a colonoscopy every 10 years starting at age 24.  Hepatitis C blood test.  Hepatitis B blood test.  Sexually transmitted disease (STD) testing.  Diabetes screening. This is done by checking your blood sugar (glucose) after you have not eaten for a while (fasting). You may have this done every 1-3 years.  Bone density scan. This is done to screen for osteoporosis. You may have this done starting at age 34.  Mammogram. This may be done every 1-2 years. Talk to your health care provider about how often you should have regular mammograms. Talk with your health care provider about your test results, treatment options, and if necessary, the need for more tests. Vaccines  Your health care provider may recommend certain vaccines, such as:  Influenza vaccine. This is recommended every year.  Tetanus, diphtheria, and acellular pertussis (Tdap, Td) vaccine. You may need a Td booster every 10 years.  Zoster vaccine. You may need this after age  60.  Pneumococcal 13-valent conjugate (PCV13) vaccine. One dose is recommended after age 79.  Pneumococcal polysaccharide (PPSV23) vaccine. One dose is recommended after age 5. Talk to your health care  provider about which screenings and vaccines you need and how often you need them. This information is not intended to replace advice given to you by your health care provider. Make sure you discuss any questions you have with your health care provider. Document Released: 07/04/2015 Document Revised: 02/25/2016 Document Reviewed: 04/08/2015 Elsevier Interactive Patient Education  2017 [place] Prevention in the Home Falls can cause injuries. They can happen to people of all ages. There are many things you can do to make your home safe and to help prevent falls. What can I do on the outside of my home?  Regularly fix the edges of walkways and driveways and fix any cracks.  Remove anything that might make you trip as you walk through a door, such as a raised step or threshold.  Trim any bushes or trees on the path to your home.  Use bright outdoor lighting.  Clear any walking paths of anything that might make someone trip, such as rocks or tools.  Regularly check to see if handrails are loose or broken. Make sure that both sides of any steps have handrails.  Any raised decks and porches should have guardrails on the edges.  Have any leaves, snow, or ice cleared regularly.  Use sand or salt on walking paths during winter.  Clean up any spills in your garage right away. This includes oil or grease spills. What can I do in the bathroom?  Use night lights.  Install grab bars by the toilet and in the tub and shower. Do not use towel bars as grab bars.  Use non-skid mats or decals in the tub or shower.  If you need to sit down in the shower, use a plastic, non-slip stool.  Keep the floor dry. Clean up any water that spills on the floor as soon as it happens.  Remove soap buildup in the tub or shower regularly.  Attach bath mats securely with double-sided non-slip rug tape.  Do not have throw rugs and other things on the floor that can make you trip. What can I do in  the bedroom?  Use night lights.  Make sure that you have a light by your bed that is easy to reach.  Do not use any sheets or blankets that are too big for your bed. They should not hang down onto the floor.  Have a firm chair that has side arms. You can use this for support while you get dressed.  Do not have throw rugs and other things on the floor that can make you trip. What can I do in the kitchen?  Clean up any spills right away.  Avoid walking on wet floors.  Keep items that you use a lot in easy-to-reach places.  If you need to reach something above you, use a strong step stool that has a grab bar.  Keep electrical cords out of the way.  Do not use floor polish or wax that makes floors slippery. If you must use wax, use non-skid floor wax.  Do not have throw rugs and other things on the floor that can make you trip. What can I do with my stairs?  Do not leave any items on the stairs.  Make sure that there are handrails on both sides of the stairs and  use them. Fix handrails that are broken or loose. Make sure that handrails are as long as the stairways.  Check any carpeting to make sure that it is firmly attached to the stairs. Fix any carpet that is loose or worn.  Avoid having throw rugs at the top or bottom of the stairs. If you do have throw rugs, attach them to the floor with carpet tape.  Make sure that you have a light switch at the top of the stairs and the bottom of the stairs. If you do not have them, ask someone to add them for you. What else can I do to help prevent falls?  Wear shoes that:  Do not have high heels.  Have rubber bottoms.  Are comfortable and fit you well.  Are closed at the toe. Do not wear sandals.  If you use a stepladder:  Make sure that it is fully opened. Do not climb a closed stepladder.  Make sure that both sides of the stepladder are locked into place.  Ask someone to hold it for you, if possible.  Clearly mark and  make sure that you can see:  Any grab bars or handrails.  First and last steps.  Where the edge of each step is.  Use tools that help you move around (mobility aids) if they are needed. These include:  Canes.  Walkers.  Scooters.  Crutches.  Turn on the lights when you go into a dark area. Replace any light bulbs as soon as they burn out.  Set up your furniture so you have a clear path. Avoid moving your furniture around.  If any of your floors are uneven, fix them.  If there are any pets around you, be aware of where they are.  Review your medicines with your doctor. Some medicines can make you feel dizzy. This can increase your chance of falling. Ask your doctor what other things that you can do to help prevent falls. This information is not intended to replace advice given to you by your health care provider. Make sure you discuss any questions you have with your health care provider. Document Released: 04/03/2009 Document Revised: 11/13/2015 Document Reviewed: 07/12/2014 Elsevier Interactive Patient Education  2017 [employer].

## 2019-03-28 ENCOUNTER — Ambulatory Visit (INDEPENDENT_AMBULATORY_CARE_PROVIDER_SITE_OTHER): Payer: PPO | Admitting: Internal Medicine

## 2019-03-28 ENCOUNTER — Encounter: Payer: Self-pay | Admitting: Internal Medicine

## 2019-03-28 ENCOUNTER — Telehealth: Payer: Self-pay

## 2019-03-28 ENCOUNTER — Ambulatory Visit: Payer: PPO

## 2019-03-28 ENCOUNTER — Other Ambulatory Visit: Payer: Self-pay

## 2019-03-28 VITALS — BP 130/72 | HR 76 | Temp 98.9°F | Ht 63.8 in | Wt 157.6 lb

## 2019-03-28 DIAGNOSIS — Z23 Encounter for immunization: Secondary | ICD-10-CM

## 2019-03-28 DIAGNOSIS — R7309 Other abnormal glucose: Secondary | ICD-10-CM

## 2019-03-28 DIAGNOSIS — R232 Flushing: Secondary | ICD-10-CM

## 2019-03-28 DIAGNOSIS — R0602 Shortness of breath: Secondary | ICD-10-CM | POA: Diagnosis not present

## 2019-03-28 DIAGNOSIS — Z0000 Encounter for general adult medical examination without abnormal findings: Secondary | ICD-10-CM

## 2019-03-28 DIAGNOSIS — I10 Essential (primary) hypertension: Secondary | ICD-10-CM

## 2019-03-28 LAB — POCT URINALYSIS DIPSTICK
Bilirubin, UA: NEGATIVE
Blood, UA: NEGATIVE
Glucose, UA: NEGATIVE
Ketones, UA: NEGATIVE
Nitrite, UA: NEGATIVE
Protein, UA: POSITIVE — AB
Spec Grav, UA: 1.02 (ref 1.010–1.025)
Urobilinogen, UA: 0.2 E.U./dL
pH, UA: 7.5 (ref 5.0–8.0)

## 2019-03-28 LAB — POCT UA - MICROALBUMIN
Albumin/Creatinine Ratio, Urine, POC: <30
Creatinine, POC: 100 mg/dL
Microalbumin Ur, POC: 10 mg/L

## 2019-03-28 NOTE — Progress Notes (Signed)
Subjective:     Patient ID: Jessica Crosby , female    DOB: July 06, 1950 , 68 y.o.   MRN: [id number]   Chief Complaint  Patient presents with  . Annual Exam  . Hypertension    HPI  She is here today for a full physical examination.  She is followed by Melvia Heaps for her GYN exams. She reports being up to date with her exams.   Hypertension This is a chronic problem. The current episode started more than 1 year ago. The problem has been gradually improving since onset. The problem is controlled. Associated symptoms include shortness of breath. Pertinent negatives include no blurred vision, chest pain or palpitations. Risk factors for coronary artery disease include post-menopausal state. The current treatment provides moderate improvement.     Past Medical History:  Diagnosis Date  . Arthritis   . GERD (gastroesophageal reflux disease)   . Hx of scarlet fever    as achild  . Hypertension   . Thyroid disease    hyperparathyroidism  . Vitamin D deficiency      Family History  Problem Relation Age of Onset  . Breast cancer Sister 27  . Prostate cancer Father 75  . Breast cancer Maternal Aunt 81  . Prostate cancer Paternal Uncle   . Throat cancer Paternal Grandfather        pipe smoker  . Breast cancer Sister 60  . Lymphoma Sister 65  . Leukemia Sister 50       as a result of chemotherapy from her lymphoma  . Cancer Sister 72       non hodgins lymphoma/ leukemia  . Pancreatic cancer Maternal Aunt 65  . Colon cancer Maternal Aunt        diagnosed in her 43s  . Thyroid cancer Maternal Aunt        dx in her 45s  . Leukemia Maternal Aunt        diagnosed in her 31s  . Breast cancer Cousin        diagnosed in her 91s  . Prostate cancer Paternal Uncle      Current Outpatient Medications:  .  aspirin 81 MG tablet, Take 81 mg by mouth daily., Disp: , Rfl:  .  atorvastatin (LIPITOR) 20 MG tablet, TAKE 1 TABLET BY MOUTH EVERY DAY, Disp: 90 tablet, Rfl: 0 .   calcium carbonate (OS-CAL) 600 MG TABS, Take 600 mg by mouth 2 (two) times daily with a meal., Disp: , Rfl:  .  cetirizine (ZYRTEC) 10 MG tablet, Take 10 mg by mouth daily., Disp: , Rfl:  .  Cholecalciferol (VITAMIN D PO), Take 1,000 mg by mouth daily. , Disp: , Rfl:  .  losartan-hydrochlorothiazide (HYZAAR) 100-12.5 MG tablet, TAKE 1 TABLET BY MOUTH EVERY DAY, Disp: 90 tablet, Rfl: 1 .  Magnesium 250 MG TABS, Take by mouth., Disp: , Rfl:  .  Multiple Vitamin (THERA) TABS, Take 1 tablet by mouth daily., Disp: , Rfl:  .  Omega-3 Fatty Acids (FISH OIL) 1000 MG CAPS, Take 1 capsule by mouth., Disp: , Rfl:  .  temazepam (RESTORIL) 15 MG capsule, Take 1 capsule (15 mg total) by mouth at bedtime as needed for sleep., Disp: 30 capsule, Rfl: 2 .  hydrochlorothiazide (MICROZIDE) 12.5 MG capsule, Take 1 capsule (12.5 mg total) by mouth daily. (Patient not taking: Reported on 03/28/2019), Disp: 90 capsule, Rfl: 1 .  losartan (COZAAR) 100 MG tablet, Take 1 tablet (100 mg total) by mouth daily. (Patient  not taking: Reported on 03/20/2019), Disp: 90 tablet, Rfl: 1   Allergies  Allergen Reactions  . Ace Inhibitors Cough     Review of Systems  Constitutional: Negative.   HENT: Negative.   Eyes: Negative.  Negative for blurred vision.  Respiratory: Positive for shortness of breath.        She reports having SOB. She reports her husband noticed that she is sob when kissing him. She denies associated palpitations, chest pain and diaphoresis. She exercises regularly, does not get SOB.  She reports this has been going on for several months. She is not sure what triggers her sx. She does admit to snoring, "when really tired".   Cardiovascular: Negative.  Negative for chest pain and palpitations.  Endocrine: Negative.   Genitourinary: Negative.   Musculoskeletal: Negative.   Skin: Negative.   Allergic/Immunologic: Negative.   Neurological: Negative.   Hematological: Negative.   Psychiatric/Behavioral: Negative.       Today's Vitals   03/28/19 1034  BP: 130/72  Pulse: 76  Temp: 98.9 F (37.2 C)  TempSrc: Oral  SpO2: 96%  Weight: 157 lb 9.6 oz (71.5 kg)  Height: 5' 3.8" (1.621 m)   Body mass index is 27.22 kg/m.   Objective:  Physical Exam Vitals signs and nursing note reviewed.  Constitutional:      Appearance: Normal appearance.  HENT:     Head: Normocephalic and atraumatic.     Right Ear: Tympanic membrane, ear canal and external ear normal.     Left Ear: Tympanic membrane, ear canal and external ear normal.     Nose: Nose normal.     Mouth/Throat:     Mouth: Mucous membranes are moist.     Pharynx: Oropharynx is clear.  Eyes:     Extraocular Movements: Extraocular movements intact.     Conjunctiva/sclera: Conjunctivae normal.     Pupils: Pupils are equal, round, and reactive to light.  Neck:     Musculoskeletal: Normal range of motion and neck supple.  Cardiovascular:     Rate and Rhythm: Normal rate and regular rhythm.     Pulses: Normal pulses.     Heart sounds: Normal heart sounds.  Pulmonary:     Effort: Pulmonary effort is normal.     Breath sounds: Normal breath sounds.  Chest:     Breasts: Tanner Score is 5.        Right: Normal.        Left: Normal.  Abdominal:     General: Abdomen is flat. Bowel sounds are normal.     Palpations: Abdomen is soft.  Genitourinary:    Comments: deferred Musculoskeletal: Normal range of motion.  Skin:    General: Skin is warm and dry.  Neurological:     General: No focal deficit present.     Mental Status: She is alert and oriented to person, place, and time.  Psychiatric:        Mood and Affect: Mood normal.        Behavior: Behavior normal.         Assessment And Plan:     1. Encounter for annual physical exam  A full exam was performed.  Importance of monthly self breast exams was discussed with the patient.  PATIENT HAS BEEN ADVISED TO GET 30-45 MINUTES REGULAR EXERCISE NO LESS THAN FOUR TO FIVE DAYS PER WEEK -  BOTH WEIGHTBEARING EXERCISES AND AEROBIC ARE RECOMMENDED.  SHE WAS ADVISED TO FOLLOW A HEALTHY DIET WITH AT LEAST SIX  FRUITS/VEGGIES PER DAY, DECREASE INTAKE OF RED MEAT, AND TO INCREASE FISH INTAKE TO TWO DAYS PER WEEK.  MEATS/FISH SHOULD NOT BE FRIED, BAKED OR BROILED IS PREFERABLE.  I SUGGEST WEARING SPF 50 SUNSCREEN ON EXPOSED PARTS AND ESPECIALLY WHEN IN THE DIRECT SUNLIGHT FOR AN EXTENDED PERIOD OF TIME.  PLEASE AVOID FAST FOOD RESTAURANTS AND INCREASE YOUR WATER INTAKE.    2. Essential hypertension  Chronic, controlled. She will continue with current meds. She is encouraged to avoid adding salt to her foods. EKG performed, no acute changes noted. She will rto in six months for re-evaluation.   - POCT Urinalysis Dipstick (81002) - POCT UA - Microalbumin - EKG 12-Lead - Hepatitis C antibody - CMP14+EGFR - CBC - Lipid panel - TSH - Ambulatory referral to Cardiology  3. Shortness of breath  Persistent. I will refer her to Cardiology and for 2d echocardiogram. She is in agreement with referral.   - ECHOCARDIOGRAM COMPLETE; Future - Ambulatory referral to Cardiology  4. Other abnormal glucose  HER A1C HAS BEEN ELEVATED IN THE PAST. I WILL CHECK AN A1C, BMET TODAY. SHE WAS ENCOURAGED TO AVOID SUGARY BEVERAGES AND PROCESSED FOODS INCLUDNG BREADS, RICE AND PASTA.  - Hemoglobin A1c  5. Immunization due  She was given pneumovax-23 to update her immunization history.   Maximino Greenland, MD    THE PATIENT IS ENCOURAGED TO PRACTICE SOCIAL DISTANCING DUE TO THE COVID-19 PANDEMIC.

## 2019-03-28 NOTE — Patient Instructions (Addendum)
Intermittent Fasting - Dr. Knox Saliva, Dr. Merrily Pew Axe   Health Maintenance, Female Adopting a healthy lifestyle and getting preventive care are important in promoting health and wellness. Ask your health care provider about:  The right schedule for you to have regular tests and exams.  Things you can do on your own to prevent diseases and keep yourself healthy. What should I know about diet, weight, and exercise? Eat a healthy diet   Eat a diet that includes plenty of vegetables, fruits, low-fat dairy products, and lean protein.  Do not eat a lot of foods that are high in solid fats, added sugars, or sodium. Maintain a healthy weight Body mass index (BMI) is used to identify weight problems. It estimates body fat based on height and weight. Your health care provider can help determine your BMI and help you achieve or maintain a healthy weight. Get regular exercise Get regular exercise. This is one of the most important things you can do for your health. Most adults should:  Exercise for at least 150 minutes each week. The exercise should increase your heart rate and make you sweat (moderate-intensity exercise).  Do strengthening exercises at least twice a week. This is in addition to the moderate-intensity exercise.  Spend less time sitting. Even light physical activity can be beneficial. Watch cholesterol and blood lipids Have your blood tested for lipids and cholesterol at 68 years of age, then have this test every 5 years. Have your cholesterol levels checked more often if:  Your lipid or cholesterol levels are high.  You are older than 68 years of age.  You are at high risk for heart disease. What should I know about cancer screening? Depending on your health history and family history, you may need to have cancer screening at various ages. This may include screening for:  Breast cancer.  Cervical cancer.  Colorectal cancer.  Skin cancer.  Lung cancer. What should I  know about heart disease, diabetes, and high blood pressure? Blood pressure and heart disease  High blood pressure causes heart disease and increases the risk of stroke. This is more likely to develop in people who have high blood pressure readings, are of African descent, or are overweight.  Have your blood pressure checked: ? Every 3-5 years if you are 55-24 years of age. ? Every year if you are 10 years old or older. Diabetes Have regular diabetes screenings. This checks your fasting blood sugar level. Have the screening done:  Once every three years after age 33 if you are at a normal weight and have a low risk for diabetes.  More often and at a younger age if you are overweight or have a high risk for diabetes. What should I know about preventing infection? Hepatitis B If you have a higher risk for hepatitis B, you should be screened for this virus. Talk with your health care provider to find out if you are at risk for hepatitis B infection. Hepatitis C Testing is recommended for:  Everyone born from [birth year] through 1965.  Anyone with known risk factors for hepatitis C. Sexually transmitted infections (STIs)  Get screened for STIs, including gonorrhea and chlamydia, if: ? You are sexually active and are younger than 68 years of age. ? You are older than 68 years of age and your health care provider tells you that you are at risk for this type of infection. ? Your sexual activity has changed since you were last screened, and you are at increased  risk for chlamydia or gonorrhea. Ask your health care provider if you are at risk.  Ask your health care provider about whether you are at high risk for HIV. Your health care provider may recommend a prescription medicine to help prevent HIV infection. If you choose to take medicine to prevent HIV, you should first get tested for HIV. You should then be tested every 3 months for as long as you are taking the medicine. Pregnancy  If you are  about to stop having your period (premenopausal) and you may become pregnant, seek counseling before you get pregnant.  Take 400 to 800 micrograms (mcg) of folic acid every day if you become pregnant.  Ask for birth control (contraception) if you want to prevent pregnancy. Osteoporosis and menopause Osteoporosis is a disease in which the bones lose minerals and strength with aging. This can result in bone fractures. If you are 76 years old or older, or if you are at risk for osteoporosis and fractures, ask your health care provider if you should:  Be screened for bone loss.  Take a calcium or vitamin D supplement to lower your risk of fractures.  Be given hormone replacement therapy (HRT) to treat symptoms of menopause. Follow these instructions at home: Lifestyle  Do not use any products that contain nicotine or tobacco, such as cigarettes, e-cigarettes, and chewing tobacco. If you need help quitting, ask your health care provider.  Do not use street drugs.  Do not share needles.  Ask your health care provider for help if you need support or information about quitting drugs. Alcohol use  Do not drink alcohol if: ? Your health care provider tells you not to drink. ? You are pregnant, may be pregnant, or are planning to become pregnant.  If you drink alcohol: ? Limit how much you use to 0-1 drink a day. ? Limit intake if you are breastfeeding.  Be aware of how much alcohol is in your drink. In the [place] one drink equals one 12 oz bottle of beer (355 mL), one 5 oz glass of wine (148 mL), or one 1 oz glass of hard liquor (44 mL). General instructions  Schedule regular health, dental, and eye exams.  Stay current with your vaccines.  Tell your health care provider if: ? You often feel depressed. ? You have ever been abused or do not feel safe at home. Summary  Adopting a healthy lifestyle and getting preventive care are important in promoting health and wellness.  Follow  your health care provider's instructions about healthy diet, exercising, and getting tested or screened for diseases.  Follow your health care provider's instructions on monitoring your cholesterol and blood pressure. This information is not intended to replace advice given to you by your health care provider. Make sure you discuss any questions you have with your health care provider. Document Released: 12/21/2010 Document Revised: 05/31/2018 Document Reviewed: 05/31/2018 Elsevier Patient Education  2020 [employer].

## 2019-03-29 ENCOUNTER — Ambulatory Visit (HOSPITAL_COMMUNITY): Payer: PPO | Attending: Cardiology

## 2019-03-29 ENCOUNTER — Telehealth: Payer: Self-pay

## 2019-03-29 DIAGNOSIS — R0602 Shortness of breath: Secondary | ICD-10-CM | POA: Insufficient documentation

## 2019-03-29 LAB — CMP14+EGFR
ALT: 26 IU/L (ref 0–32)
AST: 22 IU/L (ref 0–40)
Albumin/Globulin Ratio: 2.2 (ref 1.2–2.2)
Albumin: 4.7 g/dL (ref 3.8–4.8)
Alkaline Phosphatase: 90 IU/L (ref 39–117)
BUN/Creatinine Ratio: 15 (ref 12–28)
BUN: 14 mg/dL (ref 8–27)
Bilirubin Total: 0.6 mg/dL (ref 0.0–1.2)
CO2: 24 mmol/L (ref 20–29)
Calcium: 9.7 mg/dL (ref 8.7–10.3)
Chloride: 102 mmol/L (ref 96–106)
Creatinine, Ser: 0.91 mg/dL (ref 0.57–1.00)
GFR calc Af Amer: 75 mL/min/{1.73_m2} (ref >59)
GFR calc non Af Amer: 65 mL/min/{1.73_m2} (ref >59)
Globulin, Total: 2.1 g/dL (ref 1.5–4.5)
Glucose: 86 mg/dL (ref 65–99)
Potassium: 4.1 mmol/L (ref 3.5–5.2)
Sodium: 140 mmol/L (ref 134–144)
Total Protein: 6.8 g/dL (ref 6.0–8.5)

## 2019-03-29 LAB — LIPID PANEL
Chol/HDL Ratio: 2.2 ratio (ref 0.0–4.4)
Cholesterol, Total: 151 mg/dL (ref 100–199)
HDL: 69 mg/dL (ref >39)
LDL Chol Calc (NIH): 64 mg/dL (ref 0–99)
Triglycerides: 101 mg/dL (ref 0–149)
VLDL Cholesterol Cal: 18 mg/dL (ref 5–40)

## 2019-03-29 LAB — TSH: TSH: 1.76 u[IU]/mL (ref 0.450–4.500)

## 2019-03-29 LAB — CBC
Hematocrit: 42.8 % (ref 34.0–46.6)
Hemoglobin: 14.5 g/dL (ref 11.1–15.9)
MCH: 29.9 pg (ref 26.6–33.0)
MCHC: 33.9 g/dL (ref 31.5–35.7)
MCV: 88 fL (ref 79–97)
Platelets: 334 10*3/uL (ref 150–450)
RBC: 4.85 x10E6/uL (ref 3.77–5.28)
RDW: 12.4 % (ref 11.7–15.4)
WBC: 6.9 10*3/uL (ref 3.4–10.8)

## 2019-03-29 LAB — HEPATITIS C ANTIBODY: Hep C Virus Ab: <0.1 s/co ratio (ref 0.0–0.9)

## 2019-03-29 LAB — HEMOGLOBIN A1C
Est. average glucose Bld gHb Est-mCnc: 111 mg/dL
Hgb A1c MFr Bld: 5.5 % (ref 4.8–5.6)

## 2019-03-30 ENCOUNTER — Ambulatory Visit: Payer: Self-pay

## 2019-03-30 ENCOUNTER — Telehealth: Payer: Self-pay

## 2019-03-30 DIAGNOSIS — I10 Essential (primary) hypertension: Secondary | ICD-10-CM

## 2019-03-30 NOTE — Chronic Care Management (AMB) (Signed)
  Chronic Care Management   Social Work Note  03/30/2019 Name: HAZELENE MATEER MRN: [id number] DOB: [birth year]  SW placed an unsuccessful outbound call to the patient to conduct a SW screen for Social Determinants of Health. SW left a HIPAA compliant voice message requesting a return call.   Follow Up Plan: SW will follow up with patient by phone over the next 10 days.  Daneen Schick, BSW, CDP Social Worker, Certified Dementia Practitioner [place] [facility] [phone]

## 2019-03-31 ENCOUNTER — Encounter: Payer: Self-pay | Admitting: Internal Medicine

## 2019-04-02 ENCOUNTER — Ambulatory Visit
Admission: RE | Admit: 2019-04-02 | Discharge: 2019-04-02 | Disposition: A | Discharge status: Home / Self Care | Payer: PPO | Source: Ambulatory Visit | Attending: Endocrinology | Admitting: Endocrinology

## 2019-04-02 DIAGNOSIS — E041 Nontoxic single thyroid nodule: Secondary | ICD-10-CM

## 2019-04-02 DIAGNOSIS — E042 Nontoxic multinodular goiter: Secondary | ICD-10-CM | POA: Diagnosis not present

## 2019-04-05 ENCOUNTER — Ambulatory Visit: Payer: Self-pay

## 2019-04-05 ENCOUNTER — Telehealth: Payer: Self-pay

## 2019-04-05 DIAGNOSIS — I10 Essential (primary) hypertension: Secondary | ICD-10-CM

## 2019-04-05 NOTE — Chronic Care Management (AMB) (Signed)
  Chronic Care Management   Social Work Note  04/05/2019 Name: SIRI DREHER MRN: [id number] DOB: Jul 11, 1950  SW placed a second unsuccessful outbound call to the patient to assess for SDOH (Social Determinants of Health). HIPAA compliant voice message left for the patient requesting a return call.   Follow Up Plan: SW will follow up with patient by phone over the next two weeks.  Daneen Schick, BSW, CDP Social Worker, Certified Dementia Practitioner [place] [facility] [place] [facility] [phone]

## 2019-04-09 ENCOUNTER — Ambulatory Visit (INDEPENDENT_AMBULATORY_CARE_PROVIDER_SITE_OTHER): Payer: PPO | Admitting: Pharmacist

## 2019-04-09 DIAGNOSIS — E782 Mixed hyperlipidemia: Secondary | ICD-10-CM | POA: Diagnosis not present

## 2019-04-09 DIAGNOSIS — I10 Essential (primary) hypertension: Secondary | ICD-10-CM

## 2019-04-10 ENCOUNTER — Telehealth: Payer: Self-pay

## 2019-04-15 NOTE — Patient Instructions (Signed)
Visit Information  Goals Addressed            This Visit's Progress     Patient Stated   . "I would like to get my hot flashes under control" (pt-stated)       Current Barriers:  [name] Knowledge Deficits related to treatment management for hot flashes associated with post Menopause  Nurse Case Manager Clinical Goal(s):  [name] Over the next 30 days, patient will work with the CCM team to address needs related to effective treatment management for post menopause hot flashes  CCM RN CM Interventions:  03/20/19 call completed with patient   . Evaluation of current treatment plan related to post menopausal hot flashes and patient's adherence to plan as established by provider. . Reviewed medications with patient and discussed past failed treatments for treatment of hot flashes . Collaborated with embedded Pharm D Lottie Dawson regarding patient's request for pharmacological and or homeopathic treatment for post menopausal hot flashes . Discussed plans with patient for ongoing care management follow up and provided patient with direct contact information for care management team  CCM PharmD Interventions: 04/09/19 call completed with patient . Discussed patient's condition.  Post menopausal hot flashes have been interfering with daily life.  She is experiencing them at random times throughout the day.  Episodes often times result in extreme sweats. . Discussed risk and benefits of current available therapies for hot flashes with patient.  Also, I have discussed case with PCP, Dr. Glendale Chard, who stated patient will not be resuming bio-identical hormone replacement therapy or creams as she has tried in the past.  Patient also stated her GYN did not want her resuming hormone replacement therapy. . Discussed using peppermint oil strategically placed as directed by PharmD to aid in hot flash episodes. . Discussed potentially using OTC black cohash to aid in reducing hot flashes, however evidence is not  robust to support its use.  There appears to be no harm associated with its use.  Patient will proceed with peppermint oil for now.  We can also explore the use of SSRIs in the future as warranted. o Regarding black cohash-->As stated in the natural standard database:  "There is currently insufficient evidence to support the use of black cohosh for menopausal symptoms. However, there is adequate justification for conducting further studies in this area. The effect of black cohosh on other important outcomes, such as health-related quality of life, sexuality, bone health, night sweats and cost-effectiveness also warrants further investigation. " . After reviewing this patient's profile, no drug drug interactions exist at this time with any of the recommendations above. . Will continue to follow.  Patient Self Care Activities:  . Self administers medications as prescribed . Attends all scheduled provider appointments . Calls pharmacy for medication refills . Attends church or other social activities . Performs ADL's independently . Performs IADL's independently . Calls provider office for new concerns or questions  Please see past updates related to this goal by clicking on the "Past Updates" button in the selected goal       . I would like to manage my blood pressure (pt-stated)       Current Barriers:  . Uncontrolled hypertension, complicated by hyperlipidemia (on atorvastatin MWF-need to correct RX to reflect 3x weekly dosing) . Current antihypertensive regimen: losartan/HCTZ 100/12.5mg  daily combination pill . Previous antihypertensives tried: was on HCTZ monotherapy now combination . Goal BP 130/80 . Current home BP readings: 120/80, 130/78, 150/70 o Last OV reading was  160/100 (patient admits to white coat syndrome)  Pharmacist Clinical Goal(s):  [name] Over the next 90 days, patient will work with PharmD and providers to optimize antihypertensive regimen  Interventions: . Comprehensive  medication review performed; medication list updated in the electronic medical record.  . Counseled patient on diet/exercise  Patient Self Care Activities:  . Patient will continue to check BP daily , document, and provide at future appointments . Patient will focus on medication adherence by continuing to take medication and check BP as directed.  Patient will also focus on increasing physical activity and decreasing salt in her diet.  Initial goal documentation        The patient verbalized understanding of instructions provided today and declined a print copy of patient instruction materials.   The care management team will reach out to the patient again over the next 7 days.   SIGNATURE Regina Eck, PharmD, BCPS Clinical Pharmacist, [facility] [place]: [phone]

## 2019-04-15 NOTE — Progress Notes (Signed)
Chronic Care Management   Initial Visit Note  04/09/2019 Name: Jessica Crosby MRN: [id number] DOB: 1950-12-18  Referred by: Glendale Chard, MD Reason for referral : Chronic Care Management   Jessica Crosby is a 68 y.o. year old female who is a primary care patient of Glendale Chard, MD. The CCM team was consulted for assistance with chronic disease management and care coordination needs related to HTN and post menopausal complications  Review of patient status, including review of consultants reports, relevant laboratory and other test results, and collaboration with appropriate care team members and the patient's provider was performed as part of comprehensive patient evaluation and provision of chronic care management services.    I spoke with Jessica Crosby by telephone today.  Advanced Directives Status: N See Care Plan and Vynca application for related entries.   Medications: Outpatient Encounter Medications as of 04/09/2019  Medication Sig Note  . aspirin 81 MG tablet Take 81 mg by mouth daily.   [name] atorvastatin (LIPITOR) 20 MG tablet TAKE 1 TABLET BY MOUTH EVERY DAY 03/20/2019: Patient is taking only on Monday, Wednesday and Friday per Dr. Baird Cancer  . calcium carbonate (OS-CAL) 600 MG TABS Take 600 mg by mouth 2 (two) times daily with a meal.   . cetirizine (ZYRTEC) 10 MG tablet Take 10 mg by mouth daily.   . Cholecalciferol (VITAMIN D PO) Take 1,000 mg by mouth daily.    [name] losartan-hydrochlorothiazide (HYZAAR) 100-12.5 MG tablet TAKE 1 TABLET BY MOUTH EVERY DAY   . Magnesium 250 MG TABS Take by mouth. 06/04/2015: Received from: [place]  . Multiple Vitamin (THERA) TABS Take 1 tablet by mouth daily. 06/09/2016: Received from: [place]: Take 1 tablet by mouth daily.  . Omega-3 Fatty Acids (FISH OIL) 1000 MG CAPS Take 1 capsule by mouth. 06/04/2015: Received from: [place]  . temazepam (RESTORIL) 15 MG capsule Take 1 capsule (15 mg total)  by mouth at bedtime as needed for sleep.   . [DISCONTINUED] hydrochlorothiazide (MICROZIDE) 12.5 MG capsule Take 1 capsule (12.5 mg total) by mouth daily. (Patient not taking: Reported on 03/28/2019)   . [DISCONTINUED] losartan (COZAAR) 100 MG tablet Take 1 tablet (100 mg total) by mouth daily. (Patient not taking: Reported on 03/20/2019)    No facility-administered encounter medications on file as of 04/09/2019.      Objective:   Goals Addressed            This Visit's Progress     Patient Stated   . "I would like to get my hot flashes under control" (pt-stated)       Current Barriers:  [name] Knowledge Deficits related to treatment management for hot flashes associated with post Menopause  Nurse Case Manager Clinical Goal(s):  [name] Over the next 30 days, patient will work with the CCM team to address needs related to effective treatment management for post menopause hot flashes  CCM RN CM Interventions:  03/20/19 call completed with patient   . Evaluation of current treatment plan related to post menopausal hot flashes and patient's adherence to plan as established by provider. . Reviewed medications with patient and discussed past failed treatments for treatment of hot flashes . Collaborated with embedded Pharm D Lottie Dawson regarding patient's request for pharmacological and or homeopathic treatment for post menopausal hot flashes . Discussed plans with patient for ongoing care management follow up and provided patient with direct contact information for care management team  CCM PharmD Interventions: 04/09/19 call completed  with patient . Discussed patient's condition.  Post menopausal hot flashes have been interfering with daily life.  She is experiencing them at random times throughout the day.  Episodes often times result in extreme sweats. . Discussed risk and benefits of current available therapies for hot flashes with patient.  Also, I have discussed case with PCP, Dr. Glendale Chard, who stated patient will not be resuming bio-identical hormone replacement therapy or creams as she has tried in the past.  Patient also stated her GYN did not want her resuming hormone replacement therapy. . Discussed using peppermint oil strategically placed as directed by PharmD to aid in hot flash episodes. . Discussed potentially using OTC black cohash to aid in reducing hot flashes, however evidence is not robust to support its use.  There appears to be no harm associated with its use.  Patient will proceed with peppermint oil for now.  We can also explore the use of SSRIs in the future as warranted. o Regarding black cohash-->As stated in the natural standard database:  "There is currently insufficient evidence to support the use of black cohosh for menopausal symptoms. However, there is adequate justification for conducting further studies in this area. The effect of black cohosh on other important outcomes, such as health-related quality of life, sexuality, bone health, night sweats and cost-effectiveness also warrants further investigation. " . After reviewing this patient's profile, no drug drug interactions exist at this time with any of the recommendations above. . Will continue to follow.  Patient Self Care Activities:  . Self administers medications as prescribed . Attends all scheduled provider appointments . Calls pharmacy for medication refills . Attends church or other social activities . Performs ADL's independently . Performs IADL's independently . Calls provider office for new concerns or questions  Please see past updates related to this goal by clicking on the "Past Updates" button in the selected goal       . I would like to manage my blood pressure (pt-stated)       Current Barriers:  . Uncontrolled hypertension, complicated by hyperlipidemia (on atorvastatin MWF-need to correct RX to reflect 3x weekly dosing) . Current antihypertensive regimen: losartan/HCTZ  100/12.5mg  daily combination pill . Previous antihypertensives tried: was on HCTZ monotherapy now combination . Goal BP 130/80 . Current home BP readings: 120/80, 130/78, 150/70 o Last OV reading was 160/100 (patient admits to white coat syndrome)  Pharmacist Clinical Goal(s):  [name] Over the next 90 days, patient will work with PharmD and providers to optimize antihypertensive regimen  Interventions: . Comprehensive medication review performed; medication list updated in the electronic medical record.  . Counseled patient on diet/exercise  Patient Self Care Activities:  . Patient will continue to check BP daily , document, and provide at future appointments . Patient will focus on medication adherence by continuing to take medication and check BP as directed.  Patient will also focus on increasing physical activity and decreasing salt in her diet.  Initial goal documentation         Plan:   The care management team will reach out to the patient again over the next 7 days.   Provider Signature  Regina Eck, PharmD, BCPS Clinical Pharmacist, [facility] [place]: [phone]

## 2019-04-17 ENCOUNTER — Ambulatory Visit: Payer: Self-pay

## 2019-04-17 ENCOUNTER — Telehealth: Payer: Self-pay

## 2019-04-17 DIAGNOSIS — I10 Essential (primary) hypertension: Secondary | ICD-10-CM

## 2019-04-17 DIAGNOSIS — E782 Mixed hyperlipidemia: Secondary | ICD-10-CM

## 2019-04-17 NOTE — Chronic Care Management (AMB) (Signed)
  Chronic Care Management   Social Work General Note  04/17/2019 Name: Jessica Crosby MRN: [id number] DOB: 12/02/1950  Jessica Crosby is a 68 y.o. year old female who is a primary care patient of Glendale Chard, MD. The CCM was consulted to assist the patient with care coordination.  Review of patient status, including review of consultants reports, relevant laboratory and other test results, and collaboration with appropriate care team members and the patient's provider was performed as part of comprehensive patient evaluation and provision of chronic care management services.    SDOH (Social Determinants of Health) screening performed today. See Care Plan Entry related to challenges with: None  Advanced Directives Status: Patient reports having a current HC-POA with no interest in updating. Copy requested for patient file.  Outpatient Encounter Medications as of 04/17/2019  Medication Sig Note  . aspirin 81 MG tablet Take 81 mg by mouth daily.   [name] atorvastatin (LIPITOR) 20 MG tablet TAKE 1 TABLET BY MOUTH EVERY DAY 03/20/2019: Patient is taking only on Monday, Wednesday and Friday per Dr. Baird Cancer  . calcium carbonate (OS-CAL) 600 MG TABS Take 600 mg by mouth 2 (two) times daily with a meal.   . cetirizine (ZYRTEC) 10 MG tablet Take 10 mg by mouth daily.   . Cholecalciferol (VITAMIN D PO) Take 1,000 mg by mouth daily.    [name] losartan-hydrochlorothiazide (HYZAAR) 100-12.5 MG tablet TAKE 1 TABLET BY MOUTH EVERY DAY   . Magnesium 250 MG TABS Take by mouth. 06/04/2015: Received from: [place] [facility]  . Multiple Vitamin (THERA) TABS Take 1 tablet by mouth daily. 06/09/2016: Received from: [place]: Take 1 tablet by mouth daily.  . Omega-3 Fatty Acids (FISH OIL) 1000 MG CAPS Take 1 capsule by mouth. 06/04/2015: Received from: [place]  . temazepam (RESTORIL) 15 MG capsule Take 1 capsule (15 mg total) by mouth at bedtime as needed for sleep.    No  facility-administered encounter medications on file as of 04/17/2019.      Follow Up Plan: No further SW follow up planned at this time. The patient will remain active with CCM RN Case Manager and embedded PharmD to address disease management.       Daneen Schick, BSW, CDP Social Worker, Certified Dementia Practitioner [place] [facility] [place] [facility] [phone]

## 2019-04-20 ENCOUNTER — Ambulatory Visit: Payer: Self-pay | Admitting: Pharmacist

## 2019-04-20 DIAGNOSIS — R232 Flushing: Secondary | ICD-10-CM

## 2019-04-20 DIAGNOSIS — I10 Essential (primary) hypertension: Secondary | ICD-10-CM

## 2019-04-20 DIAGNOSIS — E782 Mixed hyperlipidemia: Secondary | ICD-10-CM

## 2019-04-23 NOTE — Progress Notes (Signed)
  Chronic Care Management   Outreach Note  04/20/2019 Name: Jessica Crosby MRN: [id number] DOB: 1950/10/25  Referred by: Glendale Chard, MD Reason for referral : Chronic Care Management   An unsuccessful telephone outreach was attempted today. The patient was referred to the case management team by for assistance with care management and care coordination.   Follow Up Plan: A HIPPA compliant phone message was left for the patient providing contact information and requesting a return call.  The care management team will reach out to the patient again over the next 5-7 days.   SIGNATURE Regina Eck, PharmD, BCPS Clinical Pharmacist, [facility] [place]: [phone]

## 2019-04-27 ENCOUNTER — Ambulatory Visit (INDEPENDENT_AMBULATORY_CARE_PROVIDER_SITE_OTHER): Payer: PPO | Admitting: Pharmacist

## 2019-04-27 DIAGNOSIS — I10 Essential (primary) hypertension: Secondary | ICD-10-CM | POA: Diagnosis not present

## 2019-04-27 DIAGNOSIS — R232 Flushing: Secondary | ICD-10-CM

## 2019-04-27 DIAGNOSIS — E782 Mixed hyperlipidemia: Secondary | ICD-10-CM | POA: Diagnosis not present

## 2019-05-01 NOTE — Patient Instructions (Signed)
Visit Information  Goals Addressed            This Visit's Progress     Patient Stated   . "I would like to get my hot flashes under control" (pt-stated)       Current Barriers:  [name] Knowledge Deficits related to treatment management for hot flashes associated with post Menopause  Nurse Case Manager Clinical Goal(s):  [name] Over the next 30 days, patient will work with the CCM team to address needs related to effective treatment management for post menopause hot flashes  CCM RN CM Interventions:  03/20/19 call completed with patient   . Evaluation of current treatment plan related to post menopausal hot flashes and patient's adherence to plan as established by provider. . Reviewed medications with patient and discussed past failed treatments for treatment of hot flashes . Collaborated with embedded Pharm D Lottie Dawson regarding patient's request for pharmacological and or homeopathic treatment for post menopausal hot flashes . Discussed plans with patient for ongoing care management follow up and provided patient with direct contact information for care management team  CCM PharmD Interventions: 04/27/19 call completed with patient . Discussed patient's condition.  Post menopausal hot flashes have been interfering with daily life.  She is experiencing them at random times throughout the day.  Episodes often result in extreme sweats at night. . Discussed risk and benefits of current available therapies for hot flashes with patient.  Also, I have discussed case with PCP, Dr. Glendale Chard, who stated patient will not be resuming bio-identical hormone replacement therapy or creams as she has tried in the past.  She has a significant family history of breast cancer.  The risks outweigh the benefits in this case. Patient also stated her GYN did not want her resuming hormone replacement therapy. . Discussed using peppermint oil strategically placed (on the neck, behind the ears and on temples---wash  hands before and after) as directed by PharmD to aid in hot flash episodes. . Discussed potentially using OTC black cohash to aid in reducing hot flashes, however evidence is not robust to support its use.  There appears to be no harm associated with its use.  Patient will proceed with peppermint oil for now.  We can also explore the use of SSRIs in the future as warranted. o Regarding black cohash-->As stated in the natural standard database:  "There is currently insufficient evidence to support the use of black cohosh for menopausal symptoms. However, there is adequate justification for conducting further studies in this area. The effect of black cohosh on other important outcomes, such as health-related quality of life, sexuality, bone health, night sweats and cost-effectiveness also warrants further investigation. " . After reviewing this patient's profile, no drug drug interactions exist at this time with any of the recommendations above. . Will continue to follow.  Patient Self Care Activities:  . Self administers medications as prescribed . Attends all scheduled provider appointments . Calls pharmacy for medication refills . Attends church or other social activities . Performs ADL's independently . Performs IADL's independently . Calls provider office for new concerns or questions  Please see past updates related to this goal by clicking on the "Past Updates" button in the selected goal       . I would like to manage my blood pressure (pt-stated)       Current Barriers:  . Uncontrolled hypertension, complicated by hyperlipidemia (on atorvastatin MWF-need to correct RX to reflect 3x weekly dosing) . Current antihypertensive regimen:  losartan/HCTZ 100/12.5mg  daily combination pill . Previous antihypertensives tried: was on HCTZ monotherapy now combination . Goal BP 130/80 . Current home BP readings: 120/80, 130/78, 150/70  o Last OV reading was 160/100 (patient admits to white coat  syndrome) o Patient remains stable.  She reports she is trying to decrease salt intake and walk more outside with her husband   Pharmacist Clinical Goal(s):  [name] Over the next 90 days, patient will work with PharmD and providers to optimize antihypertensive regimen  Interventions: . Comprehensive medication review performed; medication list updated in the electronic medical record.  . Counseled patient on diet/exercise  Patient Self Care Activities:  . Patient will continue to check BP daily , document, and provide at future appointments . Patient will focus on medication adherence by continuing to take medication and check BP as directed.  Patient will also focus on increasing physical activity and decreasing salt in her diet.  Please see past updates related to this goal by clicking on the "Past Updates" button in the selected goal         The patient verbalized understanding of instructions provided today and declined a print copy of patient instruction materials.   The care management team will reach out to the patient again over the next 30 days.   SIGNATURE Jessica Crosby, PharmD, BCPS Clinical Pharmacist, [facility] [place]: [phone]

## 2019-05-01 NOTE — Progress Notes (Signed)
Chronic Care Management   Visit Note  04/27/2019 Name: Jessica Crosby MRN: [id number] DOB: Dec 14, 1950  Referred by: Jessica Chard, MD Reason for referral : Chronic Care Management   Jessica Crosby is a 68 y.o. year old female who is a primary care patient of Jessica Chard, MD. The CCM team was consulted for assistance with chronic disease management and care coordination needs related to HTN  Review of patient status, including review of consultants reports, relevant laboratory and other test results, and collaboration with appropriate care team members and the patient's provider was performed as part of comprehensive patient evaluation and provision of chronic care management services.    I spoke with Jessica Crosby by telephone today.  Medications: Outpatient Encounter Medications as of 04/27/2019  Medication Sig Note  . aspirin 81 MG tablet Take 81 mg by mouth daily.   Jessica Crosby atorvastatin (LIPITOR) 20 MG tablet TAKE 1 TABLET BY MOUTH EVERY DAY 03/20/2019: Patient is taking only on Monday, Wednesday and Friday per Dr. Baird Cancer  . calcium carbonate (OS-CAL) 600 MG TABS Take 600 mg by mouth 2 (two) times daily with a meal.   . cetirizine (ZYRTEC) 10 MG tablet Take 10 mg by mouth daily.   . Cholecalciferol (VITAMIN D PO) Take 1,000 mg by mouth daily.    Jessica Crosby losartan-hydrochlorothiazide (HYZAAR) 100-12.5 MG tablet TAKE 1 TABLET BY MOUTH EVERY DAY   . Magnesium 250 MG TABS Take by mouth. 06/04/2015: Received from: [facility]  . Multiple Vitamin (THERA) TABS Take 1 tablet by mouth daily. 06/09/2016: Received from: [facility]: Take 1 tablet by mouth daily.  . Omega-3 Fatty Acids (FISH OIL) 1000 MG CAPS Take 1 capsule by mouth. 06/04/2015: Received from: [facility]  . temazepam (RESTORIL) 15 MG capsule Take 1 capsule (15 mg total) by mouth at bedtime as needed for sleep.    No facility-administered encounter medications on file as of 04/27/2019.      Objective:    Goals Addressed            This Visit's Progress     Patient Stated   . "I would like to get my hot flashes under control" (pt-stated)       Current Barriers:  Jessica Crosby Knowledge Deficits related to treatment management for hot flashes associated with post Menopause  Nurse Case Manager Clinical Goal(s):  Jessica Crosby Over the next 30 days, patient will work with the CCM team to address needs related to effective treatment management for post menopause hot flashes  CCM RN CM Interventions:  03/20/19 call completed with patient   . Evaluation of current treatment plan related to post menopausal hot flashes and patient's adherence to plan as established by provider. . Reviewed medications with patient and discussed past failed treatments for treatment of hot flashes . Collaborated with embedded Pharm D Lottie Dawson regarding patient's request for pharmacological and or homeopathic treatment for post menopausal hot flashes . Discussed plans with patient for ongoing care management follow up and provided patient with direct contact information for care management team  CCM PharmD Interventions: 04/27/19 call completed with patient . Discussed patient's condition.  Post menopausal hot flashes have been interfering with daily life.  She is experiencing them at random times throughout the day.  Episodes often result in extreme sweats at night. . Discussed risk and benefits of current available therapies for hot flashes with patient.  Also, I have discussed case with PCP, Dr. Glendale Crosby, who stated patient will not be  resuming bio-identical hormone replacement therapy or creams as she has tried in the past.  She has a significant family history of breast cancer.  The risks outweigh the benefits in this case. Patient also stated her GYN did not want her resuming hormone replacement therapy. . Discussed using peppermint oil strategically placed (on the neck, behind the ears and on temples---wash hands before and  after) as directed by PharmD to aid in hot flash episodes. . Discussed potentially using OTC black cohash to aid in reducing hot flashes, however evidence is not robust to support its use.  There appears to be no harm associated with its use.  Patient will proceed with peppermint oil for now.  We can also explore the use of SSRIs in the future as warranted. o Regarding black cohash-->As stated in the natural standard database:  "There is currently insufficient evidence to support the use of black cohosh for menopausal symptoms. However, there is adequate justification for conducting further studies in this area. The effect of black cohosh on other important outcomes, such as health-related quality of life, sexuality, bone health, night sweats and cost-effectiveness also warrants further investigation. " . After reviewing this patient's profile, no drug drug interactions exist at this time with any of the recommendations above. . Will continue to follow.  Patient Self Care Activities:  . Self administers medications as prescribed . Attends all scheduled provider appointments . Calls pharmacy for medication refills . Attends church or other social activities . Performs ADL's independently . Performs IADL's independently . Calls provider office for new concerns or questions  Please see past updates related to this goal by clicking on the "Past Updates" button in the selected goal       . I would like to manage my blood pressure (pt-stated)       Current Barriers:  . Uncontrolled hypertension, complicated by hyperlipidemia (on atorvastatin MWF-need to correct RX to reflect 3x weekly dosing) . Current antihypertensive regimen: losartan/HCTZ 100/12.5mg  daily combination pill . Previous antihypertensives tried: was on HCTZ monotherapy now combination . Goal BP 130/80 . Current home BP readings: 120/80, 130/78, 150/70  o Last OV reading was 160/100 (patient admits to white coat syndrome) o Patient  remains stable.  She reports she is trying to decrease salt intake and walk more outside with her husband   Pharmacist Clinical Goal(s):  Jessica Crosby Over the next 90 days, patient will work with PharmD and providers to optimize antihypertensive regimen  Interventions: . Comprehensive medication review performed; medication list updated in the electronic medical record.  . Counseled patient on diet/exercise  Patient Self Care Activities:  . Patient will continue to check BP daily , document, and provide at future appointments . Patient will focus on medication adherence by continuing to take medication and check BP as directed.  Patient will also focus on increasing physical activity and decreasing salt in her diet.  Please see past updates related to this goal by clicking on the "Past Updates" button in the selected goal         Plan:   The care management team will reach out to the patient again over the next 30 days.   Provider Signature Regina Eck, PharmD, BCPS Clinical Pharmacist, [facility] [place]: [phone]

## 2019-05-04 ENCOUNTER — Telehealth: Payer: Self-pay

## 2019-05-04 IMAGING — US US ABDOMEN COMPLETE
1 series · 14 of 25 positions shown · non-contrast
Comparison: MRI 10/08/2015.  Ultrasound 09/16/2015.

CLINICAL DATA: Epigastric and right upper quadrant abdominal pain

EXAM:
ABDOMEN ULTRASOUND COMPLETE

[Series 1: us abdomen complete · 0.23mm/px · 14 of 91 slices shown]
[im 1/91]
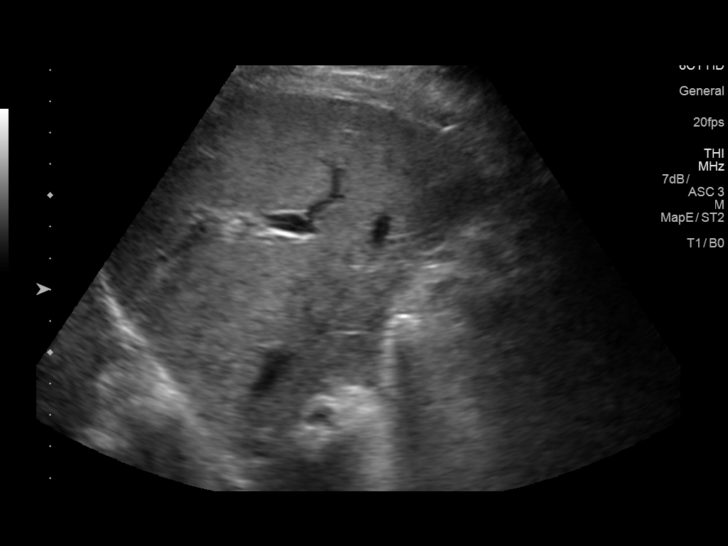
[im 8/91]
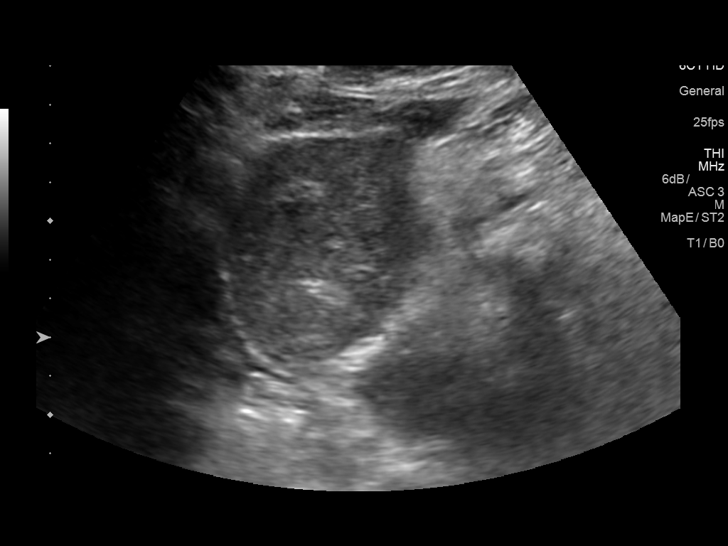
[im 16/91]
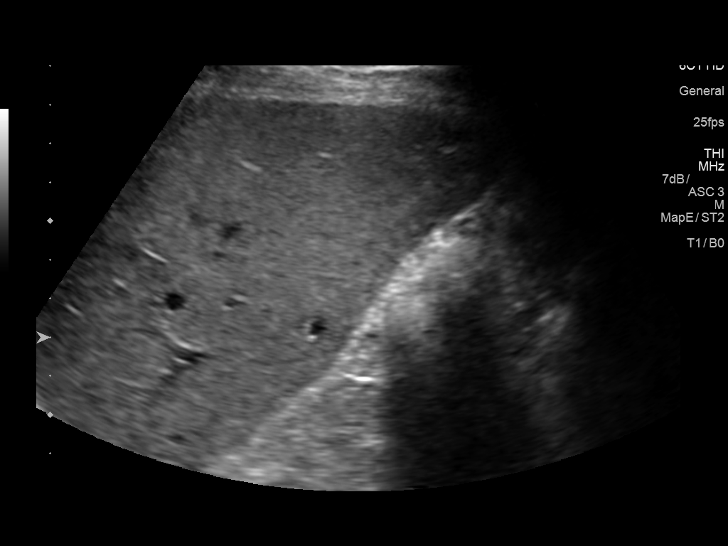
[im 23/91]
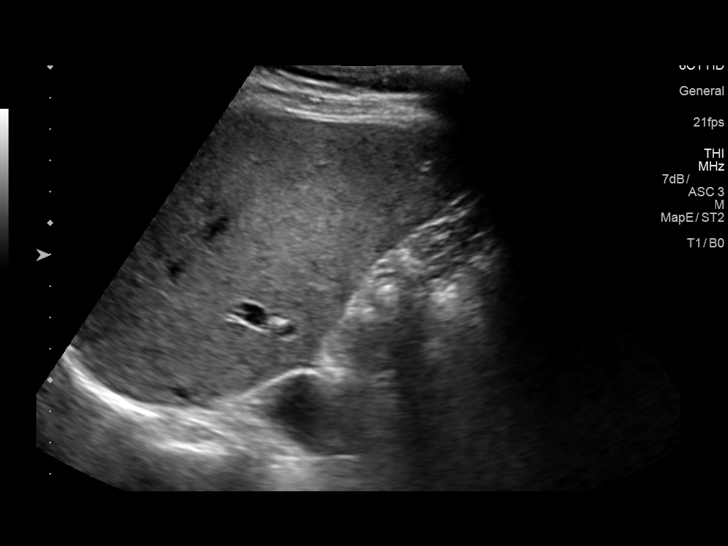
[im 31/91]
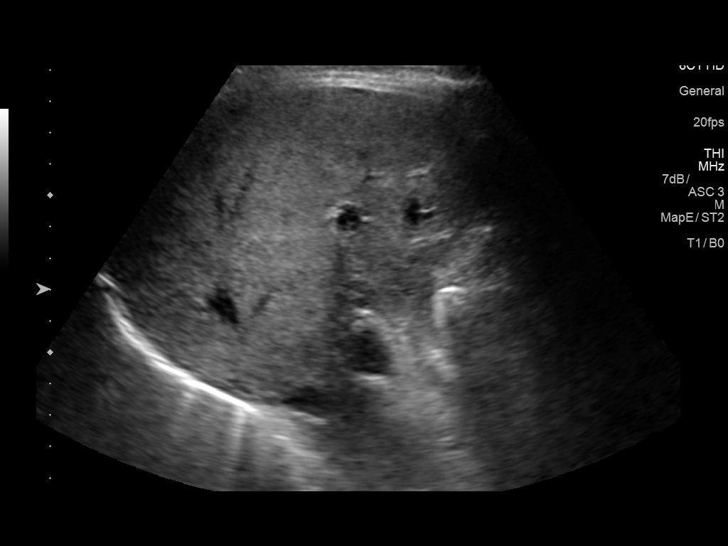
[im 34/91]
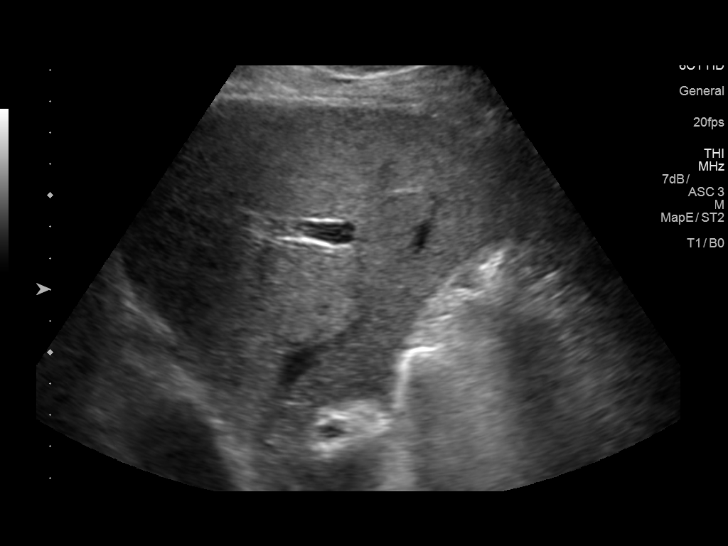
[im 42/91]
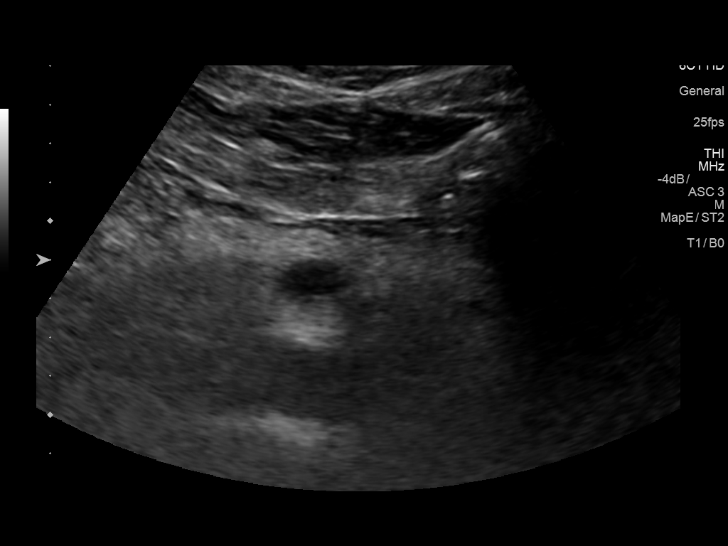
[im 49/91]
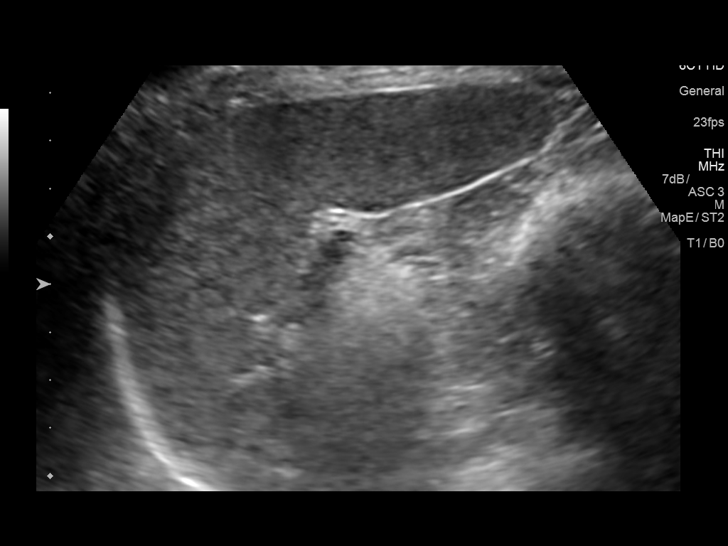
[im 57/91]
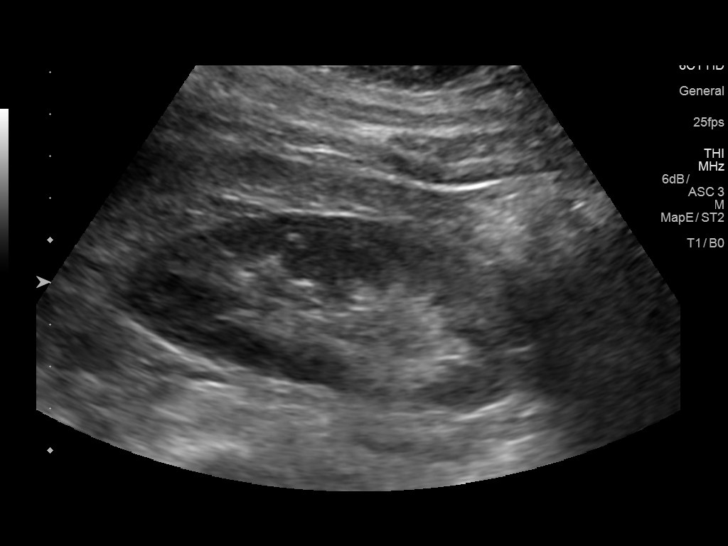
[im 61/91]
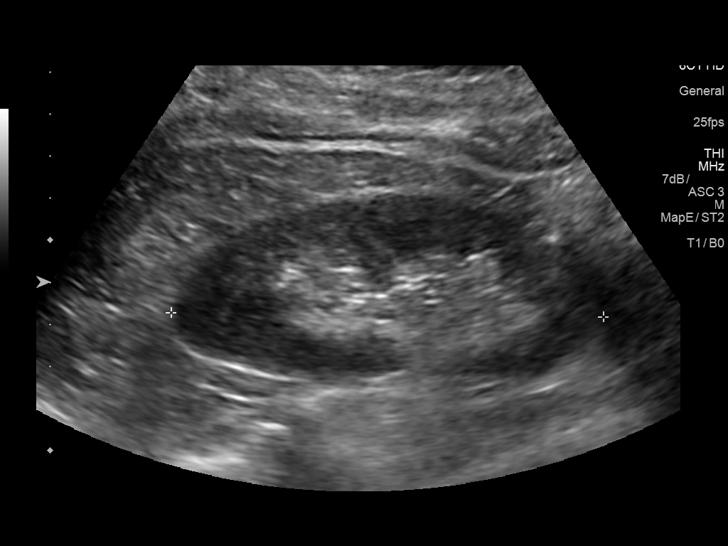
[im 68/91]
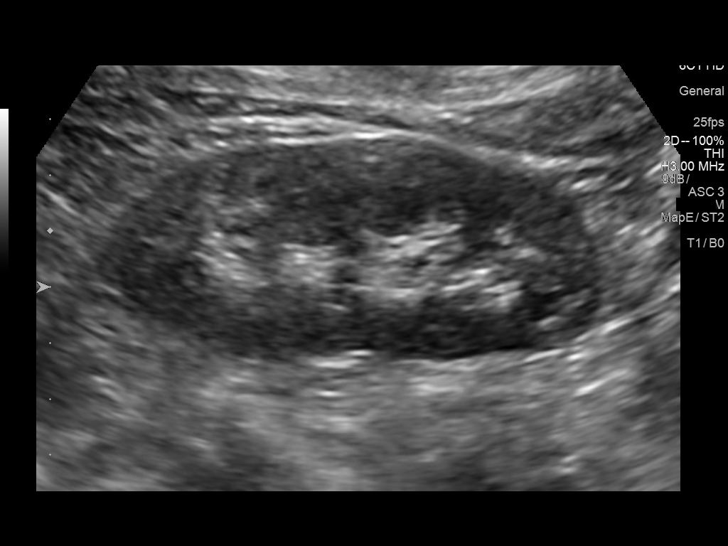
[im 76/91]
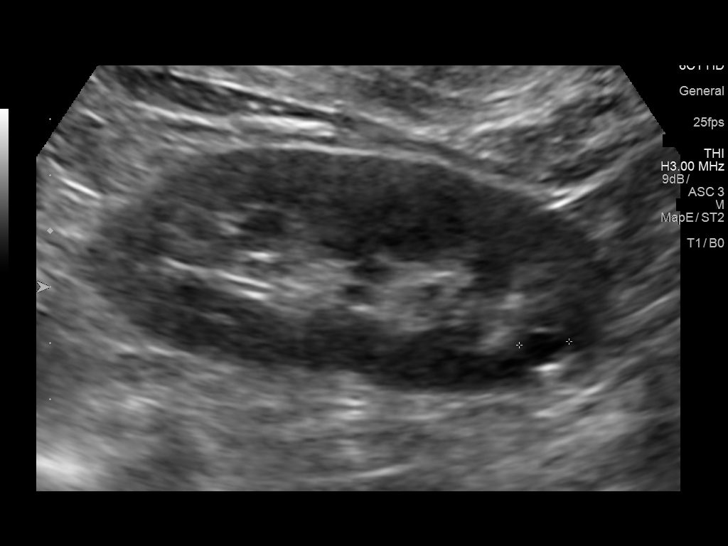
[im 83/91]
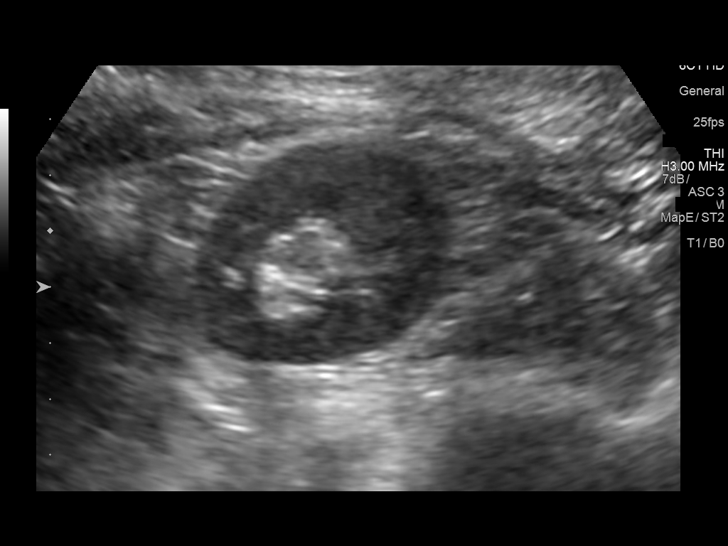
[im 91/91]
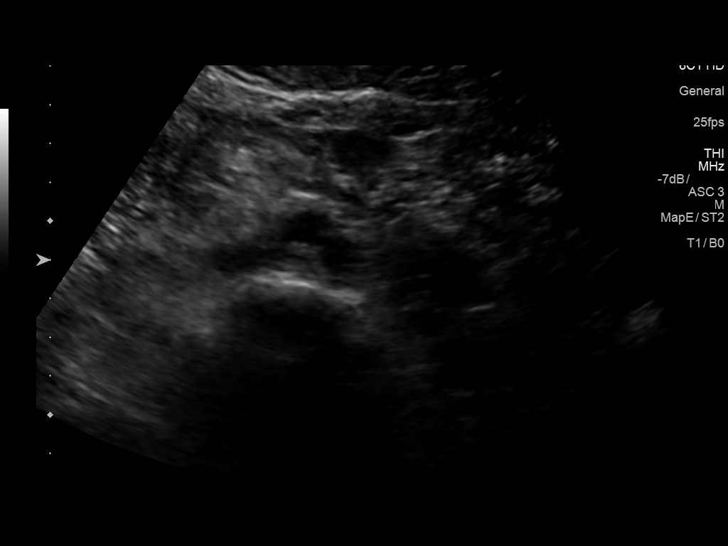

[14 of 25 positions shown; findings below may reference images not displayed]

FINDINGS: Gallbladder: Surgically absent

Common bile duct: Diameter: 11 mm, within normal limits following
cholecystectomy. No change since the previous MRI.

Liver: No focal lesion identified. Within normal limits in
parenchymal echogenicity. Portal vein is patent on color Doppler
imaging with normal direction of blood flow towards the liver.

IVC: No abnormality visualized.

Pancreas: Pancreatic tail poorly seen because of overlying bowel
gas. Otherwise normal.

Spleen: Size and appearance within normal limits.

Right Kidney: Length: 10.6 cm. Echogenicity within normal limits. No
mass or hydronephrosis visualized.

Left Kidney: Length: 10.0 cm.  Insignificant 1 cm cyst.

Abdominal aorta: No aneurysm visualized.

Other findings: No free fluid
IMPRESSION: No cause of pain identified.  Previous cholecystectomy.

## 2019-05-07 NOTE — Progress Notes (Signed)
This encounter was created in error - please disregard.

## 2019-05-21 ENCOUNTER — Ambulatory Visit: Payer: PPO | Admitting: Cardiovascular Disease

## 2019-05-27 ENCOUNTER — Other Ambulatory Visit: Payer: Self-pay | Admitting: Nurse Practitioner

## 2019-05-28 NOTE — Telephone Encounter (Signed)
Temazepam refill 

## 2019-06-04 ENCOUNTER — Telehealth: Payer: Self-pay

## 2019-06-05 ENCOUNTER — Other Ambulatory Visit: Payer: Self-pay | Admitting: Internal Medicine

## 2019-06-25 ENCOUNTER — Other Ambulatory Visit: Payer: Self-pay

## 2019-06-27 ENCOUNTER — Ambulatory Visit (INDEPENDENT_AMBULATORY_CARE_PROVIDER_SITE_OTHER): Payer: PPO | Admitting: Certified Nurse Midwife

## 2019-06-27 ENCOUNTER — Encounter: Payer: Self-pay | Admitting: Certified Nurse Midwife

## 2019-06-27 ENCOUNTER — Other Ambulatory Visit: Payer: Self-pay

## 2019-06-27 VITALS — BP 116/70 | HR 70 | Temp 97.4°F | Resp 16 | Ht 63.75 in | Wt 160.0 lb

## 2019-06-27 DIAGNOSIS — Z01419 Encounter for gynecological examination (general) (routine) without abnormal findings: Secondary | ICD-10-CM | POA: Diagnosis not present

## 2019-06-27 NOTE — Progress Notes (Signed)
69 y.o. G76P3003 Married  Caucasian Fe here for annual exam. Menopausal no HRT. Patient struggling with management of cystocele and spouse noting, which is interfering with sexual activity. Patient not having any issues with emptying bladder or protrusion from vaginal opening that she can tell. No stress incontinence or leaking.[name] Continues with yoga and exercise at gym frequently.[name]  Continues to see Dr Baird Cancer for aex, labs, cholesterol,hypertension management. All stable. Denies any other health issues today.  Patient's last menstrual period was 06/21/1998.          Sexually active: Yes.    The current method of family planning is post menopausal status.    Exercising: Yes.    walking Smoker:  no  Review of Systems  Constitutional: Negative.   HENT: Negative.   Eyes: Negative.   Respiratory: Negative.   Cardiovascular: Negative.   Gastrointestinal: Negative.   Genitourinary:       Prolapse  Musculoskeletal: Negative.   Skin: Negative.   Neurological: Negative.   Endo/Heme/Allergies: Negative.   Psychiatric/Behavioral: Negative.     Health Maintenance: Pap:  06-10-17 neg, 06-22-2018 neg HPV HR neg History of Abnormal Pap: no MMG:  08-16-2018 category b density birads 1:neg Self Breast exams: occ Colonoscopy:  2012 neg BMD:   2020 TDaP:  2016 Shingles: 2012 Pneumonia: 2020 Hep C and HIV: hep c neg 2020 Labs: if needed, PCP does   reports that she has never smoked. She has never used smokeless tobacco. She reports that she does not drink alcohol or use drugs.  Past Medical History:  Diagnosis Date  . Arthritis   . GERD (gastroesophageal reflux disease)   . Hx of scarlet fever    as achild  . Hypertension   . Thyroid disease    hyperparathyroidism  . Vitamin D deficiency     Past Surgical History:  Procedure Laterality Date  . CHOLECYSTECTOMY  1999  . PARATHYROIDECTOMY Right 2010    Current Outpatient Medications  Medication Sig Dispense Refill  . aspirin 81 MG  tablet Take 81 mg by mouth daily.    [name] atorvastatin (LIPITOR) 20 MG tablet TAKE 1 TABLET BY MOUTH EVERY DAY 90 tablet 0  . calcium carbonate (OS-CAL) 600 MG TABS Take 600 mg by mouth 2 (two) times daily with a meal.    . cetirizine (ZYRTEC) 10 MG tablet Take 10 mg by mouth daily.    . Cholecalciferol (VITAMIN D PO) Take 1,000 mg by mouth daily.     [name] losartan-hydrochlorothiazide (HYZAAR) 100-12.5 MG tablet TAKE 1 TABLET BY MOUTH EVERY DAY 90 tablet 1  . Magnesium 250 MG TABS Take by mouth.    . Multiple Vitamin (THERA) TABS Take 1 tablet by mouth daily.    . Omega-3 Fatty Acids (FISH OIL) 1000 MG CAPS Take 1 capsule by mouth.    . temazepam (RESTORIL) 15 MG capsule TAKE 1 CAPSULE BY MOUTH AT BEDTIME AS NEEDED FOR SLEEP 30 capsule 2   No current facility-administered medications for this visit.    Family History  Problem Relation Age of Onset  . Breast cancer Sister 6  . Prostate cancer Father 32  . Breast cancer Maternal Aunt 81  . Prostate cancer Paternal Uncle   . Throat cancer Paternal Grandfather        pipe smoker  . Breast cancer Sister 60  . Lymphoma Sister 53  . Leukemia Sister 65       as a result of chemotherapy from her lymphoma  . Cancer Sister  2       non hodgins lymphoma/ leukemia  . Pancreatic cancer Maternal Aunt 65  . Colon cancer Maternal Aunt        diagnosed in her 76s  . Thyroid cancer Maternal Aunt        dx in her 51s  . Leukemia Maternal Aunt        diagnosed in her 27s  . Breast cancer Cousin        diagnosed in her 9s  . Prostate cancer Paternal Uncle     ROS:  Pertinent items are noted in HPI.  Otherwise, a comprehensive ROS was negative.  Exam:   BP 116/70   Pulse 70   Temp (!) 97.4 F (36.3 C) (Skin)   Resp 16   Ht 5' 3.75" (1.619 m)   Wt 160 lb (72.6 kg)   LMP 06/21/1998   BMI 27.68 kg/m  Height: 5' 3.75" (161.9 cm) Ht Readings from Last 3 Encounters:  06/27/19 5' 3.75" (1.619 m)  03/28/19 5' 3.8" (1.621 m)  03/22/19 5\' 5"   (1.651 m)    General appearance: alert, cooperative and appears stated age Head: Normocephalic, without obvious abnormality, atraumatic Neck: no adenopathy, supple, symmetrical, trachea midline and thyroid normal to inspection and palpation Lungs: clear to auscultation bilaterally Breasts: normal appearance, no masses or tenderness, No nipple retraction or dimpling, No nipple discharge or bleeding, No axillary or supraclavicular adenopathy Heart: regular rate and rhythm Abdomen: soft, non-tender; no masses,  no organomegaly Extremities: extremities normal, atraumatic, no cyanosis or edema Skin: Skin color, texture, turgor normal. No rashes or lesions Lymph nodes: Cervical, supraclavicular, and axillary nodes normal. No abnormal inguinal nodes palpated Neurologic: Grossly normal   Pelvic: External genitalia:  no lesions              Urethra:  normal appearing urethra with no masses, tenderness or lesions              Bartholin's and Skene's: normal                 Vagina: normal appearing vagina with normal color and discharge, no lesions              Cervix: no cervical motion tenderness, no lesions and normal appearance              Pap taken: No. Bimanual Exam:  Uterus:  normal size, contour, position, consistency, mobility, non-tender and prolapse, with grade 2-3 cystocele and rectocele noted              Adnexa: normal adnexa and no mass, fullness, tenderness               Rectovaginal: Confirms               Anus:  normal sphincter tone, no lesions  Chaperone present: yes  A:  Well Woman with normal exam   Post menopausal no HRT  Uterine prolapse with grade 2-3 cystocele/ grade 1 rectocele known, no incontinence  Vaginal dryness using coconut oil as needed with good results.  Cholesterol, hypertension management with PCP  P:   Reviewed health and wellness pertinent to exam  Aware of need to advise if vaginal bleeding  Discussed findings and have increased slightly. She  feels no issues at this point and would not have surgery. Discussed pessary use and given pamphlet regarding. She will advise if she wants to try. Encouraged to try other positions for sexual activity.  Continue follow up  with PCP as indicated.  Pap smear: no   counseled on breast self exam, mammography screening, feminine hygiene, menopause, osteoporosis, adequate intake of calcium and vitamin D, diet and exercise, Kegel's exercises  return annually or prn  An After Visit Summary was printed and given to the patient.

## 2019-06-27 NOTE — Patient Instructions (Signed)
EXERCISE AND DIET:  We recommended that you start or continue a regular exercise program for good health. Regular exercise means any activity that makes your heart beat faster and makes you sweat.  We recommend exercising at least 30 minutes per day at least 3 days a week, preferably 4 or 5.  We also recommend a diet low in fat and sugar.  Inactivity, poor dietary choices and obesity can cause diabetes, heart attack, stroke, and kidney damage, among others.    ALCOHOL AND SMOKING:  Women should limit their alcohol intake to no more than 7 drinks/beers/glasses of wine (combined, not each!) per week. Moderation of alcohol intake to this level decreases your risk of breast cancer and liver damage. And of course, no recreational drugs are part of a healthy lifestyle.  And absolutely no smoking or even second hand smoke. Most people know smoking can cause heart and lung diseases, but did you know it also contributes to weakening of your bones? Aging of your skin?  Yellowing of your teeth and nails?  CALCIUM AND VITAMIN D:  Adequate intake of calcium and Vitamin D are recommended.  The recommendations for exact amounts of these supplements seem to change often, but generally speaking 600 mg of calcium (either carbonate or citrate) and 800 units of Vitamin D per day seems prudent. Certain women may benefit from higher intake of Vitamin D.  If you are among these women, your doctor will have told you during your visit.    PAP SMEARS:  Pap smears, to check for cervical cancer or precancers,  have traditionally been done yearly, although recent scientific advances have shown that most women can have pap smears less often.  However, every woman still should have a physical exam from her gynecologist every year. It will include a breast check, inspection of the vulva and vagina to check for abnormal growths or skin changes, a visual exam of the cervix, and then an exam to evaluate the size and shape of the uterus and  ovaries.  And after 69 years of age, a rectal exam is indicated to check for rectal cancers. We will also provide age appropriate advice regarding health maintenance, like when you should have certain vaccines, screening for sexually transmitted diseases, bone density testing, colonoscopy, mammograms, etc.   MAMMOGRAMS:  All women over 40 years old should have a yearly mammogram. Many facilities now offer a "3D" mammogram, which may cost around $50 extra out of pocket. If possible,  we recommend you accept the option to have the 3D mammogram performed.  It both reduces the number of women who will be called back for extra views which then turn out to be normal, and it is better than the routine mammogram at detecting truly abnormal areas.    COLONOSCOPY:  Colonoscopy to screen for colon cancer is recommended for all women at age 50.  We know, you hate the idea of the prep.  We agree, BUT, having colon cancer and not knowing it is worse!!  Colon cancer so often starts as a polyp that can be seen and removed at colonscopy, which can quite literally save your life!  And if your first colonoscopy is normal and you have no family history of colon cancer, most women don't have to have it again for 10 years.  Once every ten years, you can do something that may end up saving your life, right?  We will be happy to help you get it scheduled when you are ready.    Be sure to check your insurance coverage so you understand how much it will cost.  It may be covered as a preventative service at no cost, but you should check your particular policy.     About Cystocele  Overview  The pelvic organs, including the bladder, are normally supported by pelvic floor muscles and ligaments.  When these muscles and ligaments are stretched, weakened or torn, the wall between the bladder and the vagina sags or herniates causing a prolapse, sometimes called a cystocele.  This condition may cause discomfort and problems with emptying  the bladder.  It can be present in various stages.  Some people are not aware of the changes.  Others may notice changes at the vaginal opening or a feeling of the bladder dropping outside the body.  Causes of a Cystocele  A cystocele is usually caused by muscle straining or stretching during childbirth.  In addition, cystocele is more common after menopause, because the hormone estrogen helps keep the elastic tissues around the pelvic organs strong.  A cystocele is more likely to occur when levels of estrogen decrease.  Other causes include: heavy lifting, chronic coughing, previous pelvic surgery and obesity.  Symptoms  A bladder that has dropped from its normal position may cause: unwanted urine leakage (stress incontinence), frequent urination or urge to urinate, incomplete emptying of the bladder (not feeling bladder relief after emptying), pain or discomfort in the vagina, pelvis, groin, lower back or lower abdomen and frequent urinary tract infections.  Mild cases may not cause any symptoms.  Treatment Options  Pelvic floor (Kegel) exercises:  Strength training the muscles in your genital area  Behavioral changes: Treating and preventing constipation, taking time to empty your bladder properly, learning to lift properly and/or avoid heavy lifting when possible, stopping smoking, avoiding weight gain and treating a chronic cough or bronchitis.  A pessary: A vaginal support device is sometimes used to help pelvic support caused by muscle and ligament changes.  Surgery: Surgical repair may be necessary if symptoms cannot be managed with exercise, behavioral changes and a pessary.  Surgery is usually considered for severe cases.   2007, [employer]  Pessary for vaginal support

## 2019-07-03 ENCOUNTER — Other Ambulatory Visit: Payer: Self-pay | Admitting: Internal Medicine

## 2019-07-03 DIAGNOSIS — Z1231 Encounter for screening mammogram for malignant neoplasm of breast: Secondary | ICD-10-CM

## 2019-07-20 ENCOUNTER — Telehealth: Payer: Self-pay

## 2019-07-23 ENCOUNTER — Other Ambulatory Visit: Payer: Self-pay | Admitting: Internal Medicine

## 2019-07-25 ENCOUNTER — Telehealth: Payer: Self-pay

## 2019-08-06 ENCOUNTER — Ambulatory Visit (INDEPENDENT_AMBULATORY_CARE_PROVIDER_SITE_OTHER): Payer: PPO

## 2019-08-06 DIAGNOSIS — E782 Mixed hyperlipidemia: Secondary | ICD-10-CM | POA: Diagnosis not present

## 2019-08-06 DIAGNOSIS — I10 Essential (primary) hypertension: Secondary | ICD-10-CM | POA: Diagnosis not present

## 2019-08-06 DIAGNOSIS — R232 Flushing: Secondary | ICD-10-CM

## 2019-08-06 DIAGNOSIS — E213 Hyperparathyroidism, unspecified: Secondary | ICD-10-CM

## 2019-08-06 NOTE — Progress Notes (Signed)
This encounter was created in error - please disregard.

## 2019-08-07 ENCOUNTER — Other Ambulatory Visit: Payer: Self-pay

## 2019-08-07 ENCOUNTER — Telehealth: Payer: Self-pay

## 2019-08-07 NOTE — Patient Instructions (Signed)
Visit Information  Goals Addressed      Patient Stated   . COMPLETED: "I am having some shortness of breath" (pt-stated)       Current Barriers:  [name] Knowledge Deficits related to diagnosis and treatment of persistent exertional dyspnea . Chronic Disease Management support and education needs related to HTN, Mixed hyperlipidemia, Hyperparathyroidism, dyspnea  Nurse Case Manager Clinical Goal(s):  [name] Over the next 60 days, patient will work with PCP to address needs related to exertional dyspnea  Goal Met  CCM RN CM Interventions:  08/07/19 call completed with patient   . Evaluation of current treatment plan related to exertional dyspnea and patient's adherence to plan as established by provider. . Determined patient completed and OV with Dr. Baird Cancer and reported her exertional dyspnea for which Dr. Baird Cancer recommended a Cardiologist referral for further evaluation  . Discussed patient did not f/u with the Cardiologist due to realizing she had increased mouth breathing due to excessive nasal dryness . Determined patient discontinued use of Cetirizine and these conditions improved; the nasal dryness, mouth breathing and dyspnea have all resolved . Discussed plans with patient for ongoing care management follow up and provided patient with direct contact information for care management team  Patient Self Care Activities:  . Self administers medications as prescribed . Attends all scheduled provider appointments . Calls pharmacy for medication refills . Attends church or other social activities . Performs ADL's independently . Performs IADL's independently . Calls provider office for new concerns or questions  Please see past updates related to this goal by clicking on the "Past Updates" button in the selected goal     . COMPLETED: "I have a thyroid ultrasound scheduled" (pt-stated)       Current Barriers:  [name] Knowledge Deficits related to diagnosis and treatment for thyroid  nodule . Chronic Disease Management support and education needs related to HTN, Mixed Hyperlipidemia, Hyperparathyroidism, hot flashes  Nurse Case Manager Clinical Goal(s):  [name] Over the next 30 days, patient will verbalize understanding of plan for diagnosis and treatment of thyroid nodule; patient will complete having a thyroid ultrasound completed on 04/02/19  Goal Met  CCM RN CM Interventions:  06/05/20 call completed with patient   . Evaluation of current treatment plan related to thyroid nodule and patient's adherence to plan as established by provider. . Determined patient completed her thyroid U/S in October 2020 . Determined no further follow up or biopsy is recommended at this time with the following impression noted; o IMPRESSION: o Involution of partially cystic and partially solid 0.6 cm nodule in o the mid right lobe which now measures approximately 0.4 cm. o Otherwise stable scattered tiny nodules and cysts which do not meet o criteria for biopsy or dedicated follow-up. [name] Discussed plans with patient for ongoing care management follow up and provided patient with direct contact information for care management team  Patient Self Care Activities:  . Self administers medications as prescribed . Attends all scheduled provider appointments . Calls pharmacy for medication refills . Attends church or other social activities . Performs ADL's independently . Performs IADL's independently . Calls provider office for new concerns or questions  Please see past updates related to this goal by clicking on the "Past Updates" button in the selected goal     . COMPLETED: "I would like to get my hot flashes under control" (pt-stated)       Current Barriers:  [name] Knowledge Deficits related to treatment management for hot flashes associated with  post Menopause . Chronic Disease Management support and education needs related to HTN, Mixed hyperlipidemia, Hyperparathyroidism, hot  flashes  Nurse Case Manager Clinical Goal(s):  [name] Over the next 30 days, patient will work with the CCM team to address needs related to effective treatment management for post menopause hot flashes  Goal Met   CCM RN CM Interventions:  03/20/19 call completed with patient   . Evaluation of current treatment plan related to post menopausal hot flashes and patient's adherence to plan as established by provider. . Determined patient has collaborated with embedded Pharm D Lottie Dawson regarding how to better manage post menopausal hot flashes . Determined the hot flashes are happening primarily during the nighttime but patient is keeping her room temperature low at nighttime and is able to sleep through the night without too much disturbance in her sleep pattern due to the hot flashes . Discussed patient believes this condition is now more tolerable for her to live with and she is managing this condition well at this time . Discussed plans with patient for ongoing care management follow up and provided patient with direct contact information for care management team  CCM PharmD Interventions: 04/27/19 call completed with patient . Discussed patient's condition.  Post menopausal hot flashes have been interfering with daily life.  She is experiencing them at random times throughout the day.  Episodes often result in extreme sweats at night. . Discussed risk and benefits of current available therapies for hot flashes with patient.  Also, I have discussed case with PCP, Dr. Glendale Chard, who stated patient will not be resuming bio-identical hormone replacement therapy or creams as she has tried in the past.  She has a significant family history of breast cancer.  The risks outweigh the benefits in this case. Patient also stated her GYN did not want her resuming hormone replacement therapy. . Discussed using peppermint oil strategically placed (on the neck, behind the ears and on temples---wash hands before  and after) as directed by PharmD to aid in hot flash episodes. . Discussed potentially using OTC black cohash to aid in reducing hot flashes, however evidence is not robust to support its use.  There appears to be no harm associated with its use.  Patient will proceed with peppermint oil for now.  We can also explore the use of SSRIs in the future as warranted. o Regarding black cohash-->As stated in the natural standard database:  "There is currently insufficient evidence to support the use of black cohosh for menopausal symptoms. However, there is adequate justification for conducting further studies in this area. The effect of black cohosh on other important outcomes, such as health-related quality of life, sexuality, bone health, night sweats and cost-effectiveness also warrants further investigation. " . After reviewing this patient's profile, no drug drug interactions exist at this time with any of the recommendations above. . Will continue to follow.  Patient Self Care Activities:  . Self administers medications as prescribed . Attends all scheduled provider appointments . Calls pharmacy for medication refills . Attends church or other social activities . Performs ADL's independently . Performs IADL's independently . Calls provider office for new concerns or questions  Please see past updates related to this goal by clicking on the "Past Updates" button in the selected goal     . "I would like to lose some weight" (pt-stated)   Not on track    Current Barriers:  [name] Knowledge Deficits related to safe and effective weight loss .  Chronic Disease Management support and education needs related to HTN, Mixed hyperlipidemia, Hyperparathyroidism . Decreased mobility and activity due to COVID restrictions   Nurse Case Manager Clinical Goal(s):  [name] Over the next 90 days, patient will work with [place] to address needs related to safe and effective weight management  Goal Met . 08/07/19 New -  Over the next 90 days, patient will verbalize ongoing daily/weekly participation in a routine exercise regimen to help her achieve her weight loss goal to reach 148 lbs  CCM RN CM Interventions:  08/07/19 call completed with patient   . Evaluation of current treatment plan related to weight management and patient's adherence to plan as established by provider . Provided patient with positive reinforcement for making efforts to eating healthier and increase her activity to help with weight management  . Discussed patient's current BMI and target goal for weight loss to reach 148 lbs and determined patient is using her home equipment, walking outside her home when weather permits and is visiting the [facility]; she is also participating in [employer] . Mailed the printed patient educational materials related to Life's Simple 7; Is There A Link Between Gut Bacteria and Weight Loss? . Discussed plans with patient for ongoing care management follow up and provided patient with direct contact information for care management team  Patient Self Care Activities:  . Self administers medications as prescribed . Attends all scheduled provider appointments . Calls pharmacy for medication refills . Attends church or other social activities . Performs ADL's independently . Performs IADL's independently . Calls provider office for new concerns or questions  Please see past updates related to this goal by clicking on the "Past Updates" button in the selected goal     . COMPLETED: "I would like to start checking my BP at home" (pt-stated)       Current Barriers:  [name] Knowledge Deficits related to disease process and Self Health Management for HTN  Nurse Case Manager Clinical Goal(s):  [name] Over the next 30 days, patient will work with Consulting civil engineer to address [place] of her High Blood Pressure . Over the next 30 days, patient will report home BP readings are within target range of 130/80  CCM RN CM  Interventions:  08/07/19 call completed with patient  . Evaluation of current treatment plan related to Hypertension and patient's adherence to plan as established by provider. . Determined patient is Self monitoring her BP at home with persistent readings at goal <130/80; Determined she has started back on an exercise regimen and has multiple options for exercise with both in home exercise equipment, walking outdoors when weather permits and going to the [facility], she has also resumed [employer] . Determined patient is adhering to her prescribed medication regimen and is happy with how the management of her hypertension is going . Discussed plans with patient for ongoing care management follow up and provided patient with direct contact information for care management team  Patient Self Care Activities:  . Self administers medications as prescribed . Attends all scheduled provider appointments . Calls pharmacy for medication refills . Attends church or other social activities . Performs ADL's independently . Performs IADL's independently . Calls provider office for new concerns or questions  Please see past updates related to this goal by clicking on the "Past Updates" button in the selected goal     . I would like to manage my blood pressure (pt-stated)   On track    Current Barriers:  .  Uncontrolled hypertension, complicated by hyperlipidemia (on atorvastatin MWF-need to correct RX to reflect 3x weekly dosing) . Current antihypertensive regimen: losartan/HCTZ 100/12.61m daily combination pill . Previous antihypertensives tried: was on HCTZ monotherapy now combination . Goal BP 130/80 . Current home BP readings: 120/80, 130/78, 150/70  o Last OV reading was 160/100 (patient admits to white coat syndrome) o Patient remains stable.  She reports she is trying to decrease salt intake and walk more outside with her husband  Pharmacist Clinical Goal(s):  .Marland KitchenOver the next 90 days, patient will work  with PharmD and providers to optimize antihypertensive regimen  Interventions: . Comprehensive medication review performed; medication list updated in the electronic medical record.  . Counseled patient on diet/exercise  Patient Self Care Activities:  . Patient will continue to check BP daily , document, and provide at future appointments . Patient will focus on medication adherence by continuing to take medication and check BP as directed.  Patient will also focus on increasing physical activity and decreasing salt in her diet.  Please see past updates related to this goal by clicking on the "Past Updates" button in the selected goal        The patient verbalized understanding of instructions provided today and declined a print copy of patient instruction materials.   Telephone follow up appointment with care management team member scheduled for: 09/18/19  ABarb Merino RN, BSN, CCM Care Management Coordinator [facility]  Direct Phone: [phone]

## 2019-08-07 NOTE — Chronic Care Management (AMB) (Signed)
Chronic Care Management   Follow Up Note   08/07/2019 Name: Jessica Crosby MRN: [id number] DOB: 02/01/1951  Referred by: Glendale Chard, MD Reason for referral : Chronic Care Management (F/U call - Essential Hypertension, Mixed Hyperlipidemia, Hyperparathyroid)   Jessica Crosby is a 69 y.o. year old female who is a primary care patient of Glendale Chard, MD. The CCM team was consulted for assistance with chronic disease management and care coordination needs.    Review of patient status, including review of consultants reports, relevant laboratory and other test results, and collaboration with appropriate care team members and the patient's provider was performed as part of comprehensive patient evaluation and provision of chronic care management services.    SDOH (Social Determinants of Health) screening performed today: None. See Care Plan for related entries.   Outpatient Encounter Medications as of 08/06/2019  Medication Sig Note  . aspirin 81 MG tablet Take 81 mg by mouth daily.   [name] atorvastatin (LIPITOR) 20 MG tablet TAKE 1 TABLET BY MOUTH EVERY DAY   . calcium carbonate (OS-CAL) 600 MG TABS Take 600 mg by mouth 2 (two) times daily with a meal.   . cetirizine (ZYRTEC) 10 MG tablet Take 10 mg by mouth daily.   . Cholecalciferol (VITAMIN D PO) Take 1,000 mg by mouth daily.    [name] losartan-hydrochlorothiazide (HYZAAR) 100-12.5 MG tablet TAKE 1 TABLET BY MOUTH EVERY DAY   . Magnesium 250 MG TABS Take by mouth. 06/04/2015: Received from: [facility]  . Multiple Vitamin (THERA) TABS Take 1 tablet by mouth daily. 06/09/2016: Received from: [facility]: Take 1 tablet by mouth daily.  . Omega-3 Fatty Acids (FISH OIL) 1000 MG CAPS Take 1 capsule by mouth. 06/04/2015: Received from: [facility]  . temazepam (RESTORIL) 15 MG capsule TAKE 1 CAPSULE BY MOUTH AT BEDTIME AS NEEDED FOR SLEEP    No facility-administered encounter medications on file as of 08/06/2019.       Objective:  Lab Results  Component Value Date   HGBA1C 5.5 03/28/2019   Lab Results  Component Value Date   MICROALBUR 10 03/28/2019   LDLCALC 64 03/28/2019   CREATININE 0.91 03/28/2019   BP Readings from Last 3 Encounters:  06/27/19 116/70  03/28/19 130/72  03/22/19 (!) 140/91    Goals Addressed      Patient Stated   . COMPLETED: "I am having some shortness of breath" (pt-stated)       Current Barriers:  [name] Knowledge Deficits related to diagnosis and treatment of persistent exertional dyspnea . Chronic Disease Management support and education needs related to HTN, Mixed hyperlipidemia, Hyperparathyroidism, dyspnea  Nurse Case Manager Clinical Goal(s):  [name] Over the next 60 days, patient will work with PCP to address needs related to exertional dyspnea  Goal Met  CCM RN CM Interventions:  08/07/19 call completed with patient   . Evaluation of current treatment plan related to exertional dyspnea and patient's adherence to plan as established by provider. . Determined patient completed and OV with Dr. Baird Cancer and reported her exertional dyspnea for which Dr. Baird Cancer recommended a Cardiologist referral for further evaluation  . Discussed patient did not f/u with the Cardiologist due to realizing she had increased mouth breathing due to excessive nasal dryness . Determined patient discontinued use of Cetirizine and these conditions improved; the nasal dryness, mouth breathing and dyspnea have all resolved . Discussed plans with patient for ongoing care management follow up and provided patient with direct contact information for  care management team  Patient Self Care Activities:  . Self administers medications as prescribed . Attends all scheduled provider appointments . Calls pharmacy for medication refills . Attends church or other social activities . Performs ADL's independently . Performs IADL's independently . Calls provider office for new concerns or  questions  Please see past updates related to this goal by clicking on the "Past Updates" button in the selected goal     . COMPLETED: "I have a thyroid ultrasound scheduled" (pt-stated)       Current Barriers:  [name] Knowledge Deficits related to diagnosis and treatment for thyroid nodule . Chronic Disease Management support and education needs related to HTN, Mixed Hyperlipidemia, Hyperparathyroidism, hot flashes  Nurse Case Manager Clinical Goal(s):  [name] Over the next 30 days, patient will verbalize understanding of plan for diagnosis and treatment of thyroid nodule; patient will complete having a thyroid ultrasound completed on 04/02/19  Goal Met  CCM RN CM Interventions:  06/05/20 call completed with patient   . Evaluation of current treatment plan related to thyroid nodule and patient's adherence to plan as established by provider. . Determined patient completed her thyroid U/S in October 2020 . Determined no further follow up or biopsy is recommended at this time with the following impression noted; o IMPRESSION: o Involution of partially cystic and partially solid 0.6 cm nodule in o the mid right lobe which now measures approximately 0.4 cm. o Otherwise stable scattered tiny nodules and cysts which do not meet o criteria for biopsy or dedicated follow-up. [name] Discussed plans with patient for ongoing care management follow up and provided patient with direct contact information for care management team  Patient Self Care Activities:  . Self administers medications as prescribed . Attends all scheduled provider appointments . Calls pharmacy for medication refills . Attends church or other social activities . Performs ADL's independently . Performs IADL's independently . Calls provider office for new concerns or questions  Please see past updates related to this goal by clicking on the "Past Updates" button in the selected goal      . COMPLETED: "I would like to get my hot flashes  under control" (pt-stated)       Current Barriers:  [name] Knowledge Deficits related to treatment management for hot flashes associated with post Menopause . Chronic Disease Management support and education needs related to HTN, Mixed hyperlipidemia, Hyperparathyroidism, hot flashes  Nurse Case Manager Clinical Goal(s):  [name] Over the next 30 days, patient will work with the CCM team to address needs related to effective treatment management for post menopause hot flashes  Goal Met   CCM RN CM Interventions:  03/20/19 call completed with patient   . Evaluation of current treatment plan related to post menopausal hot flashes and patient's adherence to plan as established by provider. . Determined patient has collaborated with embedded Pharm D Lottie Dawson regarding how to better manage post menopausal hot flashes . Determined the hot flashes are happening primarily during the nighttime but patient is keeping her room temperature low at nighttime and is able to sleep through the night without too much disturbance in her sleep pattern due to the hot flashes . Discussed patient believes this condition is now more tolerable for her to live with and she is managing this condition well at this time . Discussed plans with patient for ongoing care management follow up and provided patient with direct contact information for care management team  CCM PharmD Interventions: 04/27/19 call completed with  patient . Discussed patient's condition.  Post menopausal hot flashes have been interfering with daily life.  She is experiencing them at random times throughout the day.  Episodes often result in extreme sweats at night. . Discussed risk and benefits of current available therapies for hot flashes with patient.  Also, I have discussed case with PCP, Dr. Glendale Chard, who stated patient will not be resuming bio-identical hormone replacement therapy or creams as she has tried in the past.  She has a significant family  history of breast cancer.  The risks outweigh the benefits in this case. Patient also stated her GYN did not want her resuming hormone replacement therapy. . Discussed using peppermint oil strategically placed (on the neck, behind the ears and on temples---wash hands before and after) as directed by PharmD to aid in hot flash episodes. . Discussed potentially using OTC black cohash to aid in reducing hot flashes, however evidence is not robust to support its use.  There appears to be no harm associated with its use.  Patient will proceed with peppermint oil for now.  We can also explore the use of SSRIs in the future as warranted. o Regarding black cohash-->As stated in the natural standard database:  "There is currently insufficient evidence to support the use of black cohosh for menopausal symptoms. However, there is adequate justification for conducting further studies in this area. The effect of black cohosh on other important outcomes, such as health-related quality of life, sexuality, bone health, night sweats and cost-effectiveness also warrants further investigation. " . After reviewing this patient's profile, no drug drug interactions exist at this time with any of the recommendations above. . Will continue to follow.  Patient Self Care Activities:  . Self administers medications as prescribed . Attends all scheduled provider appointments . Calls pharmacy for medication refills . Attends church or other social activities . Performs ADL's independently . Performs IADL's independently . Calls provider office for new concerns or questions  Please see past updates related to this goal by clicking on the "Past Updates" button in the selected goal     . "I would like to lose some weight" (pt-stated)   Not on track    Current Barriers:  [name] Knowledge Deficits related to safe and effective weight loss . Chronic Disease Management support and education needs related to HTN, Mixed hyperlipidemia,  Hyperparathyroidism . Decreased mobility and activity due to COVID restrictions   Nurse Case Manager Clinical Goal(s):  [name] Over the next 90 days, patient will work with [place] to address needs related to safe and effective weight management  Goal Met . 08/07/19 New - Over the next 90 days, patient will verbalize ongoing daily/weekly participation in a routine exercise regimen to help her achieve her weight loss goal to reach 148 lbs  CCM RN CM Interventions:  08/07/19 call completed with patient   . Evaluation of current treatment plan related to weight management and patient's adherence to plan as established by provider . Provided patient with positive reinforcement for making efforts to eating healthier and increase her activity to help with weight management  . Discussed patient's current BMI and target goal for weight loss to reach 148 lbs and determined patient is using her home equipment, walking outside her home when weather permits and is visiting the [facility]; she is also participating in [employer] . Mailed the printed patient educational materials related to Life's Simple 7; Is There A Link Between Gut Bacteria and Weight Loss? . Discussed  plans with patient for ongoing care management follow up and provided patient with direct contact information for care management team  Patient Self Care Activities:  . Self administers medications as prescribed . Attends all scheduled provider appointments . Calls pharmacy for medication refills . Attends church or other social activities . Performs ADL's independently . Performs IADL's independently . Calls provider office for new concerns or questions  Please see past updates related to this goal by clicking on the "Past Updates" button in the selected goal     . COMPLETED: "I would like to start checking my BP at home" (pt-stated)       Current Barriers:  [name] Knowledge Deficits related to disease process and Self Health Management for HTN  Nurse  Case Manager Clinical Goal(s):  [name] Over the next 30 days, patient will work with Consulting civil engineer to address [place] of her High Blood Pressure . Over the next 30 days, patient will report home BP readings are within target range of 130/80  CCM RN CM Interventions:  08/07/19 call completed with patient  . Evaluation of current treatment plan related to Hypertension and patient's adherence to plan as established by provider. . Determined patient is Self monitoring her BP at home with persistent readings at goal <130/80; Determined she has started back on an exercise regimen and has multiple options for exercise with both in home exercise equipment, walking outdoors when weather permits and going to the [facility], she has also resumed [employer] . Determined patient is adhering to her prescribed medication regimen and is happy with how the management of her hypertension is going . Discussed plans with patient for ongoing care management follow up and provided patient with direct contact information for care management team  Patient Self Care Activities:  . Self administers medications as prescribed . Attends all scheduled provider appointments . Calls pharmacy for medication refills . Attends church or other social activities . Performs ADL's independently . Performs IADL's independently . Calls provider office for new concerns or questions  Please see past updates related to this goal by clicking on the "Past Updates" button in the selected goal     . I would like to manage my blood pressure (pt-stated)   On track    Current Barriers:  . Uncontrolled hypertension, complicated by hyperlipidemia (on atorvastatin MWF-need to correct RX to reflect 3x weekly dosing) . Current antihypertensive regimen: losartan/HCTZ 100/12.27m daily combination pill . Previous antihypertensives tried: was on HCTZ monotherapy now combination . Goal BP 130/80 . Current home BP readings: 120/80, 130/78, 150/70    o Last OV reading was 160/100 (patient admits to white coat syndrome) o Patient remains stable.  She reports she is trying to decrease salt intake and walk more outside with her husband  Pharmacist Clinical Goal(s):  .Marland KitchenOver the next 90 days, patient will work with PharmD and providers to optimize antihypertensive regimen  Interventions: . Comprehensive medication review performed; medication list updated in the electronic medical record.  . Counseled patient on diet/exercise  Patient Self Care Activities:  . Patient will continue to check BP daily , document, and provide at future appointments . Patient will focus on medication adherence by continuing to take medication and check BP as directed.  Patient will also focus on increasing physical activity and decreasing salt in her diet.  Please see past updates related to this goal by clicking on the "Past Updates" button in the selected goal  Plan:   Telephone follow up appointment with care management team member scheduled for: 09/18/19  Barb Merino, RN, BSN, CCM Care Management Coordinator [facility]  Direct Phone: [phone]

## 2019-08-20 ENCOUNTER — Other Ambulatory Visit: Payer: Self-pay

## 2019-08-20 ENCOUNTER — Ambulatory Visit
Admission: RE | Admit: 2019-08-20 | Discharge: 2019-08-20 | Disposition: A | Discharge status: Home / Self Care | Payer: PPO | Source: Ambulatory Visit | Attending: Internal Medicine | Admitting: Internal Medicine

## 2019-08-20 DIAGNOSIS — Z1231 Encounter for screening mammogram for malignant neoplasm of breast: Secondary | ICD-10-CM

## 2019-08-26 ENCOUNTER — Other Ambulatory Visit: Payer: Self-pay | Admitting: Internal Medicine

## 2019-08-27 NOTE — Telephone Encounter (Signed)
Patient is requesting a refill of TEMAZEPAM 15 MG CAPSULE  Last OV- 03/28/2019  Next OV- 10/06/2019

## 2019-09-11 ENCOUNTER — Encounter: Payer: Self-pay | Admitting: Certified Nurse Midwife

## 2019-09-18 ENCOUNTER — Telehealth: Payer: Self-pay

## 2019-09-26 ENCOUNTER — Encounter: Payer: Self-pay | Admitting: Internal Medicine

## 2019-09-26 ENCOUNTER — Ambulatory Visit (INDEPENDENT_AMBULATORY_CARE_PROVIDER_SITE_OTHER): Payer: PPO

## 2019-09-26 ENCOUNTER — Other Ambulatory Visit: Payer: Self-pay

## 2019-09-26 ENCOUNTER — Ambulatory Visit (INDEPENDENT_AMBULATORY_CARE_PROVIDER_SITE_OTHER): Payer: PPO | Admitting: Internal Medicine

## 2019-09-26 ENCOUNTER — Other Ambulatory Visit: Payer: Self-pay | Admitting: Internal Medicine

## 2019-09-26 VITALS — BP 128/84 | HR 76 | Temp 97.7°F | Ht 63.8 in | Wt 161.0 lb

## 2019-09-26 VITALS — BP 128/84 | HR 76 | Temp 97.7°F | Ht 63.8 in | Wt 161.8 lb

## 2019-09-26 DIAGNOSIS — Z0000 Encounter for general adult medical examination without abnormal findings: Secondary | ICD-10-CM | POA: Diagnosis not present

## 2019-09-26 DIAGNOSIS — I10 Essential (primary) hypertension: Secondary | ICD-10-CM

## 2019-09-26 DIAGNOSIS — F5101 Primary insomnia: Secondary | ICD-10-CM

## 2019-09-26 DIAGNOSIS — R8290 Unspecified abnormal findings in urine: Secondary | ICD-10-CM | POA: Diagnosis not present

## 2019-09-26 DIAGNOSIS — E663 Overweight: Secondary | ICD-10-CM

## 2019-09-26 DIAGNOSIS — E782 Mixed hyperlipidemia: Secondary | ICD-10-CM | POA: Diagnosis not present

## 2019-09-26 DIAGNOSIS — R7309 Other abnormal glucose: Secondary | ICD-10-CM

## 2019-09-26 DIAGNOSIS — Z6827 Body mass index (BMI) 27.0-27.9, adult: Secondary | ICD-10-CM

## 2019-09-26 LAB — POCT URINALYSIS DIPSTICK
Bilirubin, UA: NEGATIVE
Blood, UA: NEGATIVE
Glucose, UA: NEGATIVE
Ketones, UA: NEGATIVE
Nitrite, UA: NEGATIVE
Protein, UA: NEGATIVE
Spec Grav, UA: 1.02 (ref 1.010–1.025)
Urobilinogen, UA: 0.2 E.U./dL
pH, UA: 7 (ref 5.0–8.0)

## 2019-09-26 LAB — POCT UA - MICROALBUMIN
Albumin/Creatinine Ratio, Urine, POC: <30
Creatinine, POC: 300 mg/dL
Microalbumin Ur, POC: 10 mg/L

## 2019-09-26 NOTE — Patient Instructions (Signed)
Jessica Crosby , Thank you for taking time to come for your Medicare Wellness Visit. I appreciate your ongoing commitment to your health goals. Please review the following plan we discussed and let me know if I can assist you in the future.   Screening recommendations/referrals: Colonoscopy: 11/2010 Mammogram: 08/2019 Bone Density: 07/2018 Recommended yearly ophthalmology/optometry visit for glaucoma screening and checkup Recommended yearly dental visit for hygiene and checkup  Vaccinations: Influenza vaccine: 02/2019 Pneumococcal vaccine: 03/2019 Tdap vaccine: 02/2015 Shingles vaccine: discussed    Advanced directives: Advance directive discussed with you today. Even though you declined this today please call our office should you change your mind and we can give you the proper paperwork for you to fill out.   Conditions/risks identified: overweight  Next appointment: 04/08/2020 at 11:15   Preventive Care 19 Years and Older, Female Preventive care refers to lifestyle choices and visits with your health care provider that can promote health and wellness. What does preventive care include?  A yearly physical exam. This is also called an annual well check.  Dental exams once or twice a year.  Routine eye exams. Ask your health care provider how often you should have your eyes checked.  Personal lifestyle choices, including:  Daily care of your teeth and gums.  Regular physical activity.  Eating a healthy diet.  Avoiding tobacco and drug use.  Limiting alcohol use.  Practicing safe sex.  Taking low-dose aspirin every day.  Taking vitamin and mineral supplements as recommended by your health care provider. What happens during an annual well check? The services and screenings done by your health care provider during your annual well check will depend on your age, overall health, lifestyle risk factors, and family history of disease. Counseling  Your health care provider  may ask you questions about your:  Alcohol use.  Tobacco use.  Drug use.  Emotional well-being.  Home and relationship well-being.  Sexual activity.  Eating habits.  History of falls.  Memory and ability to understand (cognition).  Work and work Statistician.  Reproductive health. Screening  You may have the following tests or measurements:  Height, weight, and BMI.  Blood pressure.  Lipid and cholesterol levels. These may be checked every 5 years, or more frequently if you are over 54 years old.  Skin check.  Lung cancer screening. You may have this screening every year starting at age 30 if you have a 30-pack-year history of smoking and currently smoke or have quit within the past 15 years.  Fecal occult blood test (FOBT) of the stool. You may have this test every year starting at age 64.  Flexible sigmoidoscopy or colonoscopy. You may have a sigmoidoscopy every 5 years or a colonoscopy every 10 years starting at age 61.  Hepatitis C blood test.  Hepatitis B blood test.  Sexually transmitted disease (STD) testing.  Diabetes screening. This is done by checking your blood sugar (glucose) after you have not eaten for a while (fasting). You may have this done every 1-3 years.  Bone density scan. This is done to screen for osteoporosis. You may have this done starting at age 72.  Mammogram. This may be done every 1-2 years. Talk to your health care provider about how often you should have regular mammograms. Talk with your health care provider about your test results, treatment options, and if necessary, the need for more tests. Vaccines  Your health care provider may recommend certain vaccines, such as:  Influenza vaccine. This is recommended every  year.  Tetanus, diphtheria, and acellular pertussis (Tdap, Td) vaccine. You may need a Td booster every 10 years.  Zoster vaccine. You may need this after age 5.  Pneumococcal 13-valent conjugate (PCV13) vaccine.  One dose is recommended after age 79.  Pneumococcal polysaccharide (PPSV23) vaccine. One dose is recommended after age 78. Talk to your health care provider about which screenings and vaccines you need and how often you need them. This information is not intended to replace advice given to you by your health care provider. Make sure you discuss any questions you have with your health care provider. Document Released: 07/04/2015 Document Revised: 02/25/2016 Document Reviewed: 04/08/2015 Elsevier Interactive Patient Education  2017 [place] Prevention in the Home Falls can cause injuries. They can happen to people of all ages. There are many things you can do to make your home safe and to help prevent falls. What can I do on the outside of my home?  Regularly fix the edges of walkways and driveways and fix any cracks.  Remove anything that might make you trip as you walk through a door, such as a raised step or threshold.  Trim any bushes or trees on the path to your home.  Use bright outdoor lighting.  Clear any walking paths of anything that might make someone trip, such as rocks or tools.  Regularly check to see if handrails are loose or broken. Make sure that both sides of any steps have handrails.  Any raised decks and porches should have guardrails on the edges.  Have any leaves, snow, or ice cleared regularly.  Use sand or salt on walking paths during winter.  Clean up any spills in your garage right away. This includes oil or grease spills. What can I do in the bathroom?  Use night lights.  Install grab bars by the toilet and in the tub and shower. Do not use towel bars as grab bars.  Use non-skid mats or decals in the tub or shower.  If you need to sit down in the shower, use a plastic, non-slip stool.  Keep the floor dry. Clean up any water that spills on the floor as soon as it happens.  Remove soap buildup in the tub or shower regularly.  Attach bath  mats securely with double-sided non-slip rug tape.  Do not have throw rugs and other things on the floor that can make you trip. What can I do in the bedroom?  Use night lights.  Make sure that you have a light by your bed that is easy to reach.  Do not use any sheets or blankets that are too big for your bed. They should not hang down onto the floor.  Have a firm chair that has side arms. You can use this for support while you get dressed.  Do not have throw rugs and other things on the floor that can make you trip. What can I do in the kitchen?  Clean up any spills right away.  Avoid walking on wet floors.  Keep items that you use a lot in easy-to-reach places.  If you need to reach something above you, use a strong step stool that has a grab bar.  Keep electrical cords out of the way.  Do not use floor polish or wax that makes floors slippery. If you must use wax, use non-skid floor wax.  Do not have throw rugs and other things on the floor that can make you trip. What can  I do with my stairs?  Do not leave any items on the stairs.  Make sure that there are handrails on both sides of the stairs and use them. Fix handrails that are broken or loose. Make sure that handrails are as long as the stairways.  Check any carpeting to make sure that it is firmly attached to the stairs. Fix any carpet that is loose or worn.  Avoid having throw rugs at the top or bottom of the stairs. If you do have throw rugs, attach them to the floor with carpet tape.  Make sure that you have a light switch at the top of the stairs and the bottom of the stairs. If you do not have them, ask someone to add them for you. What else can I do to help prevent falls?  Wear shoes that:  Do not have high heels.  Have rubber bottoms.  Are comfortable and fit you well.  Are closed at the toe. Do not wear sandals.  If you use a stepladder:  Make sure that it is fully opened. Do not climb a closed  stepladder.  Make sure that both sides of the stepladder are locked into place.  Ask someone to hold it for you, if possible.  Clearly mark and make sure that you can see:  Any grab bars or handrails.  First and last steps.  Where the edge of each step is.  Use tools that help you move around (mobility aids) if they are needed. These include:  Canes.  Walkers.  Scooters.  Crutches.  Turn on the lights when you go into a dark area. Replace any light bulbs as soon as they burn out.  Set up your furniture so you have a clear path. Avoid moving your furniture around.  If any of your floors are uneven, fix them.  If there are any pets around you, be aware of where they are.  Review your medicines with your doctor. Some medicines can make you feel dizzy. This can increase your chance of falling. Ask your doctor what other things that you can do to help prevent falls. This information is not intended to replace advice given to you by your health care provider. Make sure you discuss any questions you have with your health care provider. Document Released: 04/03/2009 Document Revised: 11/13/2015 Document Reviewed: 07/12/2014 Elsevier Interactive Patient Education  2017 [employer].

## 2019-09-26 NOTE — Patient Instructions (Addendum)
Magnesium glycinate - get at [facility], in powdered form  You were given samples of Dayvigo, 5mg  to take nightly as needed.    Insomnia Insomnia is a sleep disorder that makes it difficult to fall asleep or stay asleep. Insomnia can cause fatigue, low energy, difficulty concentrating, mood swings, and poor performance at work or school. There are three different ways to classify insomnia:  Difficulty falling asleep.  Difficulty staying asleep.  Waking up too early in the morning. Any type of insomnia can be long-term (chronic) or short-term (acute). Both are common. Short-term insomnia usually lasts for three months or less. Chronic insomnia occurs at least three times a week for longer than three months. What are the causes? Insomnia may be caused by another condition, situation, or substance, such as:  Anxiety.  Certain medicines.  Gastroesophageal reflux disease (GERD) or other gastrointestinal conditions.  Asthma or other breathing conditions.  Restless legs syndrome, sleep apnea, or other sleep disorders.  Chronic pain.  Menopause.  Stroke.  Abuse of alcohol, tobacco, or illegal drugs.  Mental health conditions, such as depression.  Caffeine.  Neurological disorders, such as Alzheimer's disease.  An overactive thyroid (hyperthyroidism). Sometimes, the cause of insomnia may not be known. What increases the risk? Risk factors for insomnia include:  Gender. Women are affected more often than men.  Age. Insomnia is more common as you get older.  Stress.  Lack of exercise.  Irregular work schedule or working night shifts.  Traveling between different time zones.  Certain medical and mental health conditions. What are the signs or symptoms? If you have insomnia, the main symptom is having trouble falling asleep or having trouble staying asleep. This may lead to other symptoms, such as:  Feeling fatigued or having low energy.  Feeling nervous about going  to sleep.  Not feeling rested in the morning.  Having trouble concentrating.  Feeling irritable, anxious, or depressed. How is this diagnosed? This condition may be diagnosed based on:  Your symptoms and medical history. Your health care provider may ask about: ? Your sleep habits. ? Any medical conditions you have. ? Your mental health.  A physical exam. How is this treated? Treatment for insomnia depends on the cause. Treatment may focus on treating an underlying condition that is causing insomnia. Treatment may also include:  Medicines to help you sleep.  Counseling or therapy.  Lifestyle adjustments to help you sleep better. Follow these instructions at home: Eating and drinking   Limit or avoid alcohol, caffeinated beverages, and cigarettes, especially close to bedtime. These can disrupt your sleep.  Do not eat a large meal or eat spicy foods right before bedtime. This can lead to digestive discomfort that can make it hard for you to sleep. Sleep habits   Keep a sleep diary to help you and your health care provider figure out what could be causing your insomnia. Write down: ? When you sleep. ? When you wake up during the night. ? How well you sleep. ? How rested you feel the next day. ? Any side effects of medicines you are taking. ? What you eat and drink.  Make your bedroom a dark, comfortable place where it is easy to fall asleep. ? Put up shades or blackout curtains to block light from outside. ? Use a white noise machine to block noise. ? Keep the temperature cool.  Limit screen use before bedtime. This includes: ? Watching TV. ? Using your smartphone, tablet, or computer.  Stick to a  routine that includes going to bed and waking up at the same times every day and night. This can help you fall asleep faster. Consider making a quiet activity, such as reading, part of your nighttime routine.  Try to avoid taking naps during the day so that you sleep better  at night.  Get out of bed if you are still awake after 15 minutes of trying to sleep. Keep the lights down, but try reading or doing a quiet activity. When you feel sleepy, go back to bed. General instructions  Take over-the-counter and prescription medicines only as told by your health care provider.  Exercise regularly, as told by your health care provider. Avoid exercise starting several hours before bedtime.  Use relaxation techniques to manage stress. Ask your health care provider to suggest some techniques that may work well for you. These may include: ? Breathing exercises. ? Routines to release muscle tension. ? Visualizing peaceful scenes.  Make sure that you drive carefully. Avoid driving if you feel very sleepy.  Keep all follow-up visits as told by your health care provider. This is important. Contact a health care provider if:  You are tired throughout the day.  You have trouble in your daily routine due to sleepiness.  You continue to have sleep problems, or your sleep problems get worse. Get help right away if:  You have serious thoughts about hurting yourself or someone else. If you ever feel like you may hurt yourself or others, or have thoughts about taking your own life, get help right away. You can go to your nearest emergency department or call:  Your local emergency services (911 in the [place]  A suicide crisis helpline, such as the [facility] at (563)237-3818. This is open 24 hours a day. Summary  Insomnia is a sleep disorder that makes it difficult to fall asleep or stay asleep.  Insomnia can be long-term (chronic) or short-term (acute).  Treatment for insomnia depends on the cause. Treatment may focus on treating an underlying condition that is causing insomnia.  Keep a sleep diary to help you and your health care provider figure out what could be causing your insomnia. This information is not intended to replace advice  given to you by your health care provider. Make sure you discuss any questions you have with your health care provider. Document Revised: 05/20/2017 Document Reviewed: 03/17/2017 Elsevier Patient Education  2020 [employer].

## 2019-09-26 NOTE — Progress Notes (Signed)
This visit occurred during the SARS-CoV-2 public health emergency.  Safety protocols were in place, including screening questions prior to the visit, additional usage of staff PPE, and extensive cleaning of exam room while observing appropriate contact time as indicated for disinfecting solutions.  Subjective:     Patient ID: Jessica Crosby , female    DOB: 1951/01/12 , 69 y.o.   MRN: [id number]   Chief Complaint  Patient presents with  . Hypertension    HPI  Hypertension This is a chronic problem. The current episode started more than 1 year ago. The problem has been gradually improving since onset. The problem is controlled. Pertinent negatives include no blurred vision, chest pain, palpitations or shortness of breath. Compliance problems include exercise.      Past Medical History:  Diagnosis Date  . Arthritis   . GERD (gastroesophageal reflux disease)   . Hx of scarlet fever    as achild  . Hypertension   . Thyroid disease    hyperparathyroidism  . Vitamin D deficiency      Family History  Problem Relation Age of Onset  . Breast cancer Sister 66  . Prostate cancer Father 49  . Breast cancer Maternal Aunt 81  . Prostate cancer Paternal Uncle   . Throat cancer Paternal Grandfather        pipe smoker  . Breast cancer Sister 49  . Lymphoma Sister 35  . Leukemia Sister 72       as a result of chemotherapy from her lymphoma  . Cancer Sister 25       non hodgins lymphoma/ leukemia  . Pancreatic cancer Maternal Aunt 65  . Colon cancer Maternal Aunt        diagnosed in her 75s  . Thyroid cancer Maternal Aunt        dx in her 84s  . Leukemia Maternal Aunt        diagnosed in her 34s  . Breast cancer Cousin        diagnosed in her 27s  . Prostate cancer Paternal Uncle      Current Outpatient Medications:  .  aspirin 81 MG tablet, Take 81 mg by mouth daily., Disp: , Rfl:  .  atorvastatin (LIPITOR) 20 MG tablet, TAKE 1 TABLET BY MOUTH EVERY DAY, Disp: 90  tablet, Rfl: 0 .  calcium carbonate (OS-CAL) 600 MG TABS, Take 600 mg by mouth 2 (two) times daily with a meal., Disp: , Rfl:  .  Cholecalciferol (VITAMIN D PO), Take 1,000 mg by mouth daily. , Disp: , Rfl:  .  losartan-hydrochlorothiazide (HYZAAR) 100-12.5 MG tablet, TAKE 1 TABLET BY MOUTH EVERY DAY, Disp: 90 tablet, Rfl: 1 .  Magnesium 250 MG TABS, Take by mouth., Disp: , Rfl:  .  Multiple Vitamin (THERA) TABS, Take 1 tablet by mouth daily., Disp: , Rfl:  .  Omega-3 Fatty Acids (FISH OIL) 1000 MG CAPS, Take 1 capsule by mouth., Disp: , Rfl:  .  temazepam (RESTORIL) 15 MG capsule, TAKE 1 CAPSULE BY MOUTH AT BEDTIME AS NEEDED FOR SLEEP, Disp: 30 capsule, Rfl: 2 .  cetirizine (ZYRTEC) 10 MG tablet, Take 10 mg by mouth daily., Disp: , Rfl:    Allergies  Allergen Reactions  . Ace Inhibitors Cough     Review of Systems  Constitutional: Negative.   Eyes: Negative for blurred vision.  Respiratory: Negative.  Negative for shortness of breath.   Cardiovascular: Negative.  Negative for chest pain and palpitations.  Gastrointestinal: Negative.  Neurological: Negative.   Psychiatric/Behavioral: Negative.      Today's Vitals   09/26/19 1048  BP: 128/84  Pulse: 76  Temp: 97.7 F (36.5 C)  Weight: 161 lb 12.8 oz (73.4 kg)  Height: 5' 3.8" (1.621 m)   Body mass index is 27.95 kg/m.   Objective:  Physical Exam Vitals and nursing note reviewed.  Constitutional:      Appearance: Normal appearance.  HENT:     Head: Normocephalic and atraumatic.  Cardiovascular:     Rate and Rhythm: Normal rate and regular rhythm.     Heart sounds: Normal heart sounds.  Pulmonary:     Effort: Pulmonary effort is normal.     Breath sounds: Normal breath sounds.  Skin:    General: Skin is warm.  Neurological:     General: No focal deficit present.     Mental Status: She is alert.  Psychiatric:        Mood and Affect: Mood normal.        Behavior: Behavior normal.         Assessment And Plan:    1. Essential hypertension  Chronic, well controlled. She will continue with current meds.  I will check renal function today. She will rto in six months for re-evaluation.   - CMP14+EGFR  2. Mixed hyperlipidemia  Chronic. Importance of medication compliance was discussed with the patient. She is encouraged to avoid fried foods and to continue to exercise at least 150 minutes per week.   3. Other abnormal glucose  HER A1C HAS BEEN ELEVATED IN THE PAST. I WILL CHECK AN A1C, BMET TODAY. SHE WAS ENCOURAGED TO AVOID SUGARY BEVERAGES AND PROCESSED FOODS INCLUDNG BREADS, RICE AND PASTA.  - Hemoglobin A1c  4. Overweight with body mass index (BMI) of 27 to 27.9 in adult  Her BMI is acceptable for her demographic. She is encouraged to exercise at least 150 minutes per week.  5. Primary insomnia  Chronic. She will continue with temazepam, 80m nightly as needed. She is also advised to develop a bedtime routine.    RMaximino Greenland MD    THE PATIENT IS ENCOURAGED TO PRACTICE SOCIAL DISTANCING DUE TO THE COVID-19 PANDEMIC.

## 2019-09-26 NOTE — Progress Notes (Signed)
This visit occurred during the SARS-CoV-2 public health emergency.  Safety protocols were in place, including screening questions prior to the visit, additional usage of staff PPE, and extensive cleaning of exam room while observing appropriate contact time as indicated for disinfecting solutions.  Subjective:   Jessica Crosby is a 69 y.o. female who presents for Medicare Annual (Subsequent) preventive examination.  Review of Systems:  n/a Cardiac Risk Factors include: advanced age (>44mn, >>60women);hypertension;dyslipidemia     Objective:     Vitals: BP 128/84   Pulse 76   Temp 97.7 F (36.5 C) (Oral)   Ht 5' 3.8" (1.621 m)   Wt 161 lb (73 kg)   LMP 06/21/1998   BMI 27.81 kg/m   Body mass index is 27.81 kg/m.  Advanced Directives 09/26/2019 04/17/2019 03/22/2019  Does Patient Have a Medical Advance Directive? No Yes No  Type of Advance Directive - HBovey-  Does patient want to make changes to medical advance directive? - No - Patient declined -  Copy of HGallianoin Chart? - No - copy requested -  Would patient like information on creating a medical advance directive? No - Patient declined - -    Tobacco Social History   Tobacco Use  Smoking Status Never Smoker  Smokeless Tobacco Never Used     Counseling given: Not Answered   Clinical Intake:  Pre-visit preparation completed: Yes  Pain : No/denies pain     Nutritional Status: BMI 25 -29 Overweight Nutritional Risks: None Diabetes: No  How often do you need to have someone help you when you read instructions, pamphlets, or other written materials from your doctor or pharmacy?: 1 - Never What is the last grade level you completed in school?: 1 year college  Interpreter Needed?: No  Information entered by :: NAllen LPN  Past Medical History:  Diagnosis Date  . Arthritis   . GERD (gastroesophageal reflux disease)   . Hx of scarlet fever    as achild  .  Hypertension   . Thyroid disease    hyperparathyroidism  . Vitamin D deficiency    Past Surgical History:  Procedure Laterality Date  . CHOLECYSTECTOMY  1999  . PARATHYROIDECTOMY Right 2010   Family History  Problem Relation Age of Onset  . Breast cancer Sister 432 . Prostate cancer Father 768 . Breast cancer Maternal Aunt 81  . Prostate cancer Paternal Uncle   . Throat cancer Paternal Grandfather        pipe smoker  . Breast cancer Sister 565 . Lymphoma Sister 516 . Leukemia Sister 593      as a result of chemotherapy from her lymphoma  . Cancer Sister 563      non hodgins lymphoma/ leukemia  . Pancreatic cancer Maternal Aunt 65  . Colon cancer Maternal Aunt        diagnosed in her 649s . Thyroid cancer Maternal Aunt        dx in her 767s . Leukemia Maternal Aunt        diagnosed in her 644s . Breast cancer Cousin        diagnosed in her 51s . Prostate cancer Paternal Uncle    Social History   Socioeconomic History  . Marital status: Married    Spouse name: Not on file  . Number of children: Not on file  . Years of education: Not on file  .  Highest education level: Not on file  Occupational History  . Not on file  Tobacco Use  . Smoking status: Never Smoker  . Smokeless tobacco: Never Used  Substance and Sexual Activity  . Alcohol use: No  . Drug use: No  . Sexual activity: Yes    Partners: Male    Birth control/protection: Post-menopausal  Other Topics Concern  . Not on file  Social History Narrative  . Not on file   Social Determinants of Health   Financial Resource Strain: Low Risk   . Difficulty of Paying Living Expenses: Not hard at all  Food Insecurity: No Food Insecurity  . Worried About Charity fundraiser in the Last Year: Never true  . Ran Out of Food in the Last Year: Never true  Transportation Needs: No Transportation Needs  . Lack of Transportation (Medical): No  . Lack of Transportation (Non-Medical): No  Physical Activity:  Sufficiently Active  . Days of Exercise per Week: 4 days  . Minutes of Exercise per Session: 60 min  Stress: No Stress Concern Present  . Feeling of Stress : Not at all  Social Connections:   . Frequency of Communication with Friends and Family:   . Frequency of Social Gatherings with Friends and Family:   . Attends Religious Services:   . Active Member of Clubs or Organizations:   . Attends Archivist Meetings:   [name] Marital Status:     Outpatient Encounter Medications as of 09/26/2019  Medication Sig  . aspirin 81 MG tablet Take 81 mg by mouth daily.  [name] atorvastatin (LIPITOR) 20 MG tablet TAKE 1 TABLET BY MOUTH EVERY DAY  . calcium carbonate (OS-CAL) 600 MG TABS Take 600 mg by mouth 2 (two) times daily with a meal.  . cetirizine (ZYRTEC) 10 MG tablet Take 10 mg by mouth daily.  . Cholecalciferol (VITAMIN D PO) Take 1,000 mg by mouth daily.   [name] losartan-hydrochlorothiazide (HYZAAR) 100-12.5 MG tablet TAKE 1 TABLET BY MOUTH EVERY DAY  . Magnesium 250 MG TABS Take by mouth.  . Multiple Vitamin (THERA) TABS Take 1 tablet by mouth daily.  . Omega-3 Fatty Acids (FISH OIL) 1000 MG CAPS Take 1 capsule by mouth.  . temazepam (RESTORIL) 15 MG capsule TAKE 1 CAPSULE BY MOUTH AT BEDTIME AS NEEDED FOR SLEEP   No facility-administered encounter medications on file as of 09/26/2019.    Activities of Daily Living In your present state of health, do you have any difficulty performing the following activities: 09/26/2019 03/22/2019  Hearing? N N  Vision? N N  Difficulty concentrating or making decisions? N N  Walking or climbing stairs? N N  Dressing or bathing? N N  Doing errands, shopping? N N  Preparing Food and eating ? N N  Using the Toilet? N N  In the past six months, have you accidently leaked urine? N N  Do you have problems with loss of bowel control? N N  Managing your Medications? N N  Managing your Finances? N N  Housekeeping or managing your Housekeeping? N N  Some recent  data might be hidden    Patient Care Team: Glendale Chard, MD as PCP - General (Internal Medicine) Rex Kras Claudette Stapler, RN    Assessment:   This is a routine wellness examination for Jessica Crosby.  Exercise Activities and Dietary recommendations Current Exercise Habits: Home exercise routine, Type of exercise: walking;yoga;strength training/weights, Time (Minutes): 60, Frequency (Times/Week): 4, Weekly Exercise (Minutes/Week): 240  Goals    . "  I would like to lose some weight" (pt-stated)     Current Barriers:  [name] Knowledge Deficits related to safe and effective weight loss . Chronic Disease Management support and education needs related to HTN, Mixed hyperlipidemia, Hyperparathyroidism . Decreased mobility and activity due to COVID restrictions   Nurse Case Manager Clinical Goal(s):  [name] Over the next 90 days, patient will work with [place] to address needs related to safe and effective weight management  Goal Met . 08/07/19 New - Over the next 90 days, patient will verbalize ongoing daily/weekly participation in a routine exercise regimen to help her achieve her weight loss goal to reach 148 lbs  CCM RN CM Interventions:  08/07/19 call completed with patient   . Evaluation of current treatment plan related to weight management and patient's adherence to plan as established by provider . Provided patient with positive reinforcement for making efforts to eating healthier and increase her activity to help with weight management  . Discussed patient's current BMI and target goal for weight loss to reach 148 lbs and determined patient is using her home equipment, walking outside her home when weather permits and is visiting the [facility]; she is also participating in [employer] . Mailed the printed patient educational materials related to Life's Simple 7; Is There A Link Between Gut Bacteria and Weight Loss? . Discussed plans with patient for ongoing care management follow up and provided patient with direct  contact information for care management team  Patient Self Care Activities:  . Self administers medications as prescribed . Attends all scheduled provider appointments . Calls pharmacy for medication refills . Attends church or other social activities . Performs ADL's independently . Performs IADL's independently . Calls provider office for new concerns or questions  Please see past updates related to this goal by clicking on the "Past Updates" button in the selected goal      . I would like to manage my blood pressure (pt-stated)     Current Barriers:  . Uncontrolled hypertension, complicated by hyperlipidemia (on atorvastatin MWF-need to correct RX to reflect 3x weekly dosing) . Current antihypertensive regimen: losartan/HCTZ 100/12.4m daily combination pill . Previous antihypertensives tried: was on HCTZ monotherapy now combination . Goal BP 130/80 . Current home BP readings: 120/80, 130/78, 150/70  o Last OV reading was 160/100 (patient admits to white coat syndrome) o Patient remains stable.  She reports she is trying to decrease salt intake and walk more outside with her husband   Pharmacist Clinical Goal(s):  .Marland KitchenOver the next 90 days, patient will work with PharmD and providers to optimize antihypertensive regimen  Interventions: . Comprehensive medication review performed; medication list updated in the electronic medical record.  . Counseled patient on diet/exercise  Patient Self Care Activities:  . Patient will continue to check BP daily , document, and provide at future appointments . Patient will focus on medication adherence by continuing to take medication and check BP as directed.  Patient will also focus on increasing physical activity and decreasing salt in her diet.  Please see past updates related to this goal by clicking on the "Past Updates" button in the selected goal      . Weight (lb) < 200 lb (90.7 kg)     03/22/2019, wants to weigh 140 pounds    .  Weight (lb) < 200 lb (90.7 kg)     09/26/2019, wants to get to 140 pounds, by stop eating cookies       Fall Risk  Fall Risk  09/26/2019 09/26/2019 03/28/2019 03/22/2019 03/01/2019  Falls in the past year? 0 0 0 0 0  Number falls in past yr: - 0 - 0 -  Injury with Fall? - 0 - - -  Risk for fall due to : Medication side effect - - Medication side effect -  Follow up Falls evaluation completed;Education provided;Falls prevention discussed - - Falls evaluation completed;Falls prevention discussed -   Is the patient's home free of loose throw rugs in walkways, pet beds, electrical cords, etc?   yes      Grab bars in the bathroom? no      Handrails on the stairs?   yes      Adequate lighting?   yes  Timed Get Up and Go performed: n/a  Depression Screen PHQ 2/9 Scores 09/26/2019 09/26/2019 03/28/2019 03/22/2019  PHQ - 2 Score 0 0 0 0     Cognitive Function     6CIT Screen 09/26/2019 03/22/2019  What Year? 0 points 0 points  What month? 0 points 0 points  What time? 0 points 0 points  Count back from 20 0 points 0 points  Months in reverse 0 points 0 points  Repeat phrase 0 points 0 points  Total Score 0 0    Immunization History  Administered Date(s) Administered  . DT (Pediatric) 02/12/2003  . Influenza, High Dose Seasonal PF 03/13/2018, 03/01/2019  . Influenza, Seasonal, Injecte, Preservative Fre 04/29/2001, 05/11/2005, 03/30/2006, 05/12/2007, 04/01/2015  . Influenza-Unspecified 03/26/2017  . Pneumococcal Conjugate-13 05/26/2016  . Pneumococcal Polysaccharide-23 03/28/2019  . Tdap 03/04/2015  . Zoster 06/22/2010    Qualifies for Shingles Vaccine? yes  Screening Tests Health Maintenance  Topic Date Due  . INFLUENZA VACCINE  01/20/2020  . COLONOSCOPY  12/17/2020  . MAMMOGRAM  08/19/2021  . TETANUS/TDAP  03/03/2025  . DEXA SCAN  Completed  . Hepatitis C Screening  Completed  . PNA vac Low Risk Adult  Completed    Cancer Screenings: Lung: Low Dose CT Chest recommended if Age  32-80 years, 30 pack-year currently smoking OR have quit w/in 15years. Patient does not qualify. Breast:  Up to date on Mammogram? Yes   Up to date of Bone Density/Dexa? Yes Colorectal: up to date  Additional Screenings: : Hepatitis C Screening: 03/28/2019     Plan:    patient wants to get down to 140 pounds and decrease her intake of cookies.   I have personally reviewed and noted the following in the patient's chart:   . Medical and social history . Use of alcohol, tobacco or illicit drugs  . Current medications and supplements . Functional ability and status . Nutritional status . Physical activity . Advanced directives . List of other physicians . Hospitalizations, surgeries, and ER visits in previous 12 months . Vitals . Screenings to include cognitive, depression, and falls . Referrals and appointments  In addition, I have reviewed and discussed with patient certain preventive protocols, quality metrics, and best practice recommendations. A written personalized care plan for preventive services as well as general preventive health recommendations were provided to patient.     Kellie Simmering, LPN  12/20/5364

## 2019-09-27 LAB — CMP14+EGFR
ALT: 23 IU/L (ref 0–32)
AST: 21 IU/L (ref 0–40)
Albumin/Globulin Ratio: 2.3 — ABNORMAL HIGH (ref 1.2–2.2)
Albumin: 4.6 g/dL (ref 3.8–4.8)
Alkaline Phosphatase: 84 IU/L (ref 39–117)
BUN/Creatinine Ratio: 14 (ref 12–28)
BUN: 14 mg/dL (ref 8–27)
Bilirubin Total: 0.5 mg/dL (ref 0.0–1.2)
CO2: 26 mmol/L (ref 20–29)
Calcium: 9.5 mg/dL (ref 8.7–10.3)
Chloride: 102 mmol/L (ref 96–106)
Creatinine, Ser: 0.98 mg/dL (ref 0.57–1.00)
GFR calc Af Amer: 68 mL/min/{1.73_m2} (ref >59)
GFR calc non Af Amer: 59 mL/min/{1.73_m2} — ABNORMAL LOW (ref >59)
Globulin, Total: 2 g/dL (ref 1.5–4.5)
Glucose: 88 mg/dL (ref 65–99)
Potassium: 4.1 mmol/L (ref 3.5–5.2)
Sodium: 142 mmol/L (ref 134–144)
Total Protein: 6.6 g/dL (ref 6.0–8.5)

## 2019-09-27 LAB — URINE CULTURE

## 2019-09-27 LAB — HEMOGLOBIN A1C
Est. average glucose Bld gHb Est-mCnc: 105 mg/dL
Hgb A1c MFr Bld: 5.3 % (ref 4.8–5.6)

## 2019-10-31 ENCOUNTER — Telehealth: Payer: Self-pay

## 2019-11-28 ENCOUNTER — Other Ambulatory Visit: Payer: Self-pay

## 2019-11-28 ENCOUNTER — Ambulatory Visit: Payer: Self-pay

## 2019-11-28 ENCOUNTER — Telehealth: Payer: Self-pay

## 2019-11-28 DIAGNOSIS — I10 Essential (primary) hypertension: Secondary | ICD-10-CM

## 2019-11-28 DIAGNOSIS — E782 Mixed hyperlipidemia: Secondary | ICD-10-CM

## 2019-11-28 DIAGNOSIS — E213 Hyperparathyroidism, unspecified: Secondary | ICD-10-CM

## 2019-11-28 DIAGNOSIS — R232 Flushing: Secondary | ICD-10-CM

## 2019-11-29 ENCOUNTER — Other Ambulatory Visit: Payer: Self-pay | Admitting: Internal Medicine

## 2019-11-29 NOTE — Telephone Encounter (Signed)
Please refill patient's prescription 

## 2019-11-29 NOTE — Chronic Care Management (AMB) (Signed)
  Chronic Care Management   Follow Up Note   11/28/2019 Name: Jessica Crosby MRN: [id number] DOB: [birth year]  Referred by: Jessica Chard, MD Reason for referral : Chronic Care Management (F/U call - HTN/Wt loss management - f/u on pat ed mats)   Jessica Crosby is a 69 y.o. year old female who is a primary care patient of Jessica Chard, MD. The CCM team was consulted for assistance with chronic disease management and care coordination needs.    Review of patient status, including review of consultants reports, relevant laboratory and other test results, and collaboration with appropriate care team members and the patient's provider was performed as part of comprehensive patient evaluation and provision of chronic care management services.    SDOH (Social Determinants of Health) assessments performed: No See Care Plan activities for detailed interventions related to Jessica Crosby)   Reviewed chart in preparation to contact patient.   Outpatient Encounter Medications as of 11/28/2019  Medication Sig Note  . aspirin 81 MG tablet Take 81 mg by mouth daily.   [name] atorvastatin (LIPITOR) 20 MG tablet TAKE 1 TABLET BY MOUTH EVERY DAY   . calcium carbonate (OS-CAL) 600 MG TABS Take 600 mg by mouth 2 (two) times daily with a meal.   . cetirizine (ZYRTEC) 10 MG tablet Take 10 mg by mouth daily.   . Cholecalciferol (VITAMIN D PO) Take 1,000 mg by mouth daily.    [name] losartan-hydrochlorothiazide (HYZAAR) 100-12.5 MG tablet TAKE 1 TABLET BY MOUTH EVERY DAY   . Magnesium 250 MG TABS Take by mouth. 06/04/2015: Received from: [place]  . Multiple Vitamin (THERA) TABS Take 1 tablet by mouth daily. 06/09/2016: Received from: [place]: Take 1 tablet by mouth daily.  . Omega-3 Fatty Acids (FISH OIL) 1000 MG CAPS Take 1 capsule by mouth. 06/04/2015: Received from: [place]  . [DISCONTINUED] temazepam (RESTORIL) 15 MG capsule TAKE 1 CAPSULE BY MOUTH AT BEDTIME AS NEEDED FOR SLEEP      No facility-administered encounter medications on file as of 11/28/2019.     Objective:  Lab Results  Component Value Date   HGBA1C 5.3 09/26/2019   HGBA1C 5.5 03/28/2019   Lab Results  Component Value Date   MICROALBUR 10 09/26/2019   LDLCALC 64 03/28/2019   CREATININE 0.98 09/26/2019   BP Readings from Last 3 Encounters:  09/26/19 128/84  09/26/19 128/84  06/27/19 116/70   Plan:   Telephone follow up appointment with care management team member scheduled for: 12/04/19  Jessica Merino, RN, BSN, CCM Care Management Coordinator [facility]  Direct Phone: [phone]

## 2019-12-04 ENCOUNTER — Telehealth: Payer: Self-pay

## 2019-12-04 ENCOUNTER — Other Ambulatory Visit: Payer: Self-pay

## 2019-12-04 ENCOUNTER — Ambulatory Visit (INDEPENDENT_AMBULATORY_CARE_PROVIDER_SITE_OTHER): Payer: PPO

## 2019-12-04 DIAGNOSIS — I10 Essential (primary) hypertension: Secondary | ICD-10-CM

## 2019-12-04 DIAGNOSIS — E782 Mixed hyperlipidemia: Secondary | ICD-10-CM | POA: Diagnosis not present

## 2019-12-04 DIAGNOSIS — R232 Flushing: Secondary | ICD-10-CM

## 2019-12-04 DIAGNOSIS — E213 Hyperparathyroidism, unspecified: Secondary | ICD-10-CM

## 2019-12-05 NOTE — Chronic Care Management (AMB) (Signed)
  Chronic Care Management   Outreach Note  12/05/2019 Name: Jessica Crosby MRN: [id number] DOB: Nov 04, 1950  Referred by: Glendale Chard, MD Reason for referral : Chronic Care Management (FU RN CM Call )   An unsuccessful telephone outreach was attempted today. The patient was referred to the case management team for assistance with care management and care coordination. Spoke with [name] briefly, she stated now is not a good time to speak with me due to she is meeting family for lunch. She agreed to call back or she may call me back when she is available.   Follow Up Plan: Telephone follow up appointment with care management team member scheduled for: 01/10/20  Barb Merino, RN, BSN, CCM Care Management Coordinator [place] [facility]  Direct Phone: [phone]

## 2019-12-31 ENCOUNTER — Other Ambulatory Visit: Payer: Self-pay | Admitting: Internal Medicine

## 2020-01-10 ENCOUNTER — Telehealth: Payer: Self-pay

## 2020-01-15 DIAGNOSIS — H40013 Open angle with borderline findings, low risk, bilateral: Secondary | ICD-10-CM | POA: Diagnosis not present

## 2020-01-17 IMAGING — US US THYROID
1 series · 13 of 25 positions shown · non-contrast
Comparison: 02/01/2017 and additional prior studies.

CLINICAL DATA: Prior ultrasound follow-up. History of goiter with
multiple subcentimeter nodules.

EXAM:
THYROID ULTRASOUND
TECHNIQUE: Ultrasound examination of the thyroid gland and adjacent soft
tissues was performed.

[Series 1: us thyroid · 0.08mm/px · 13 of 44 slices shown]
[im 1/44]
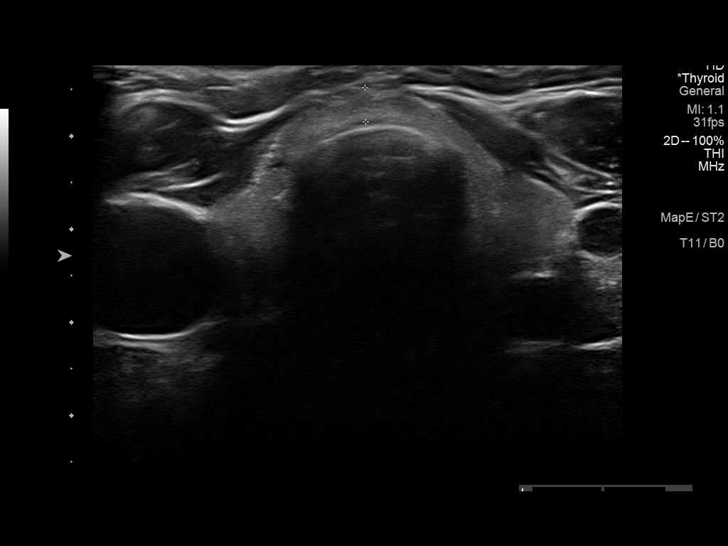
[im 4/44]
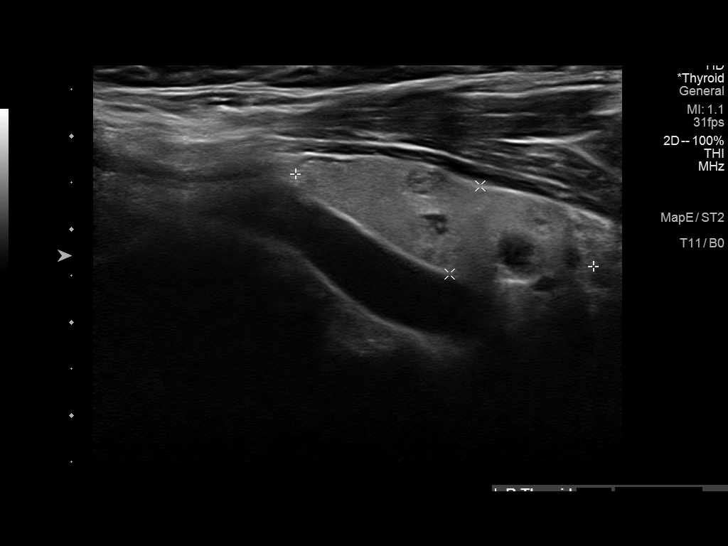
[im 8/44]
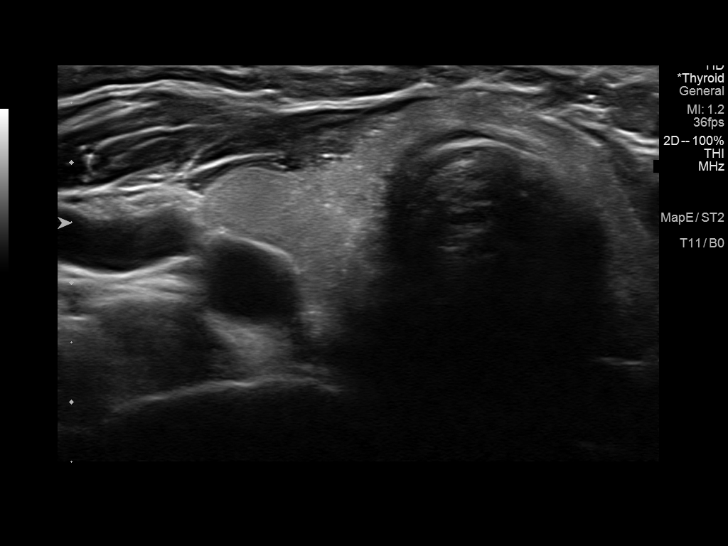
[im 11/44]
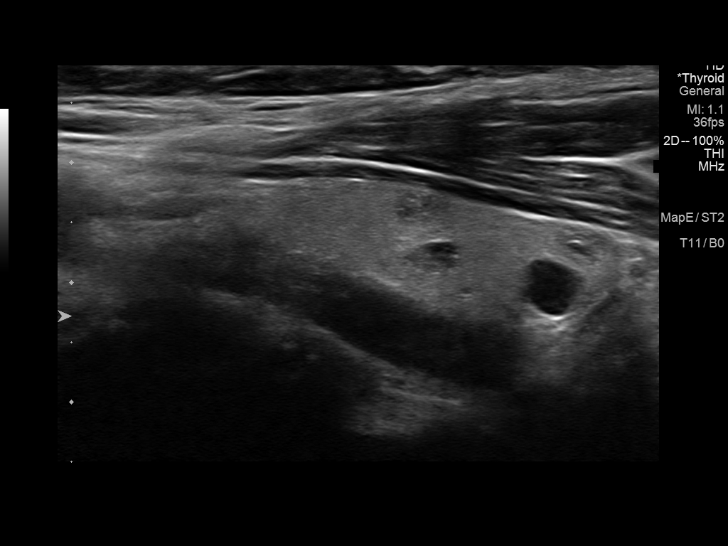
[im 15/44]
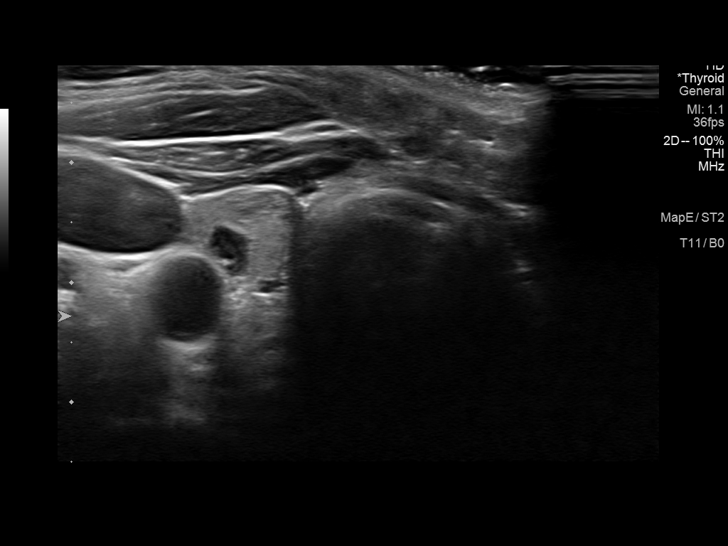
[im 18/44]
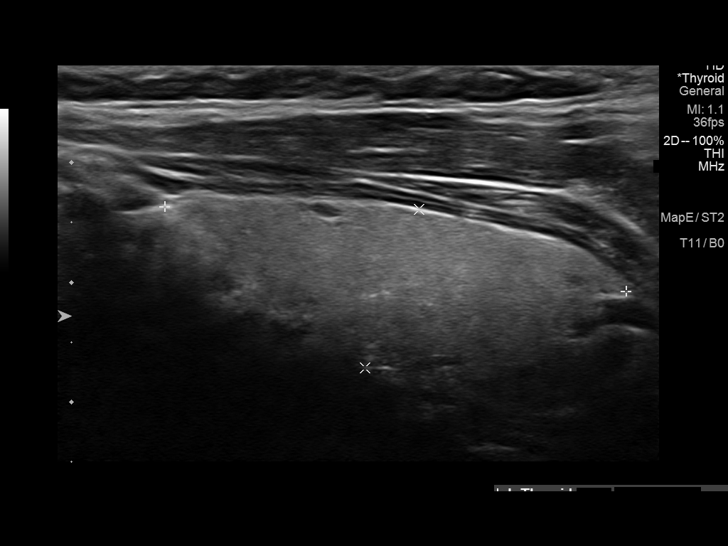
[im 22/44]
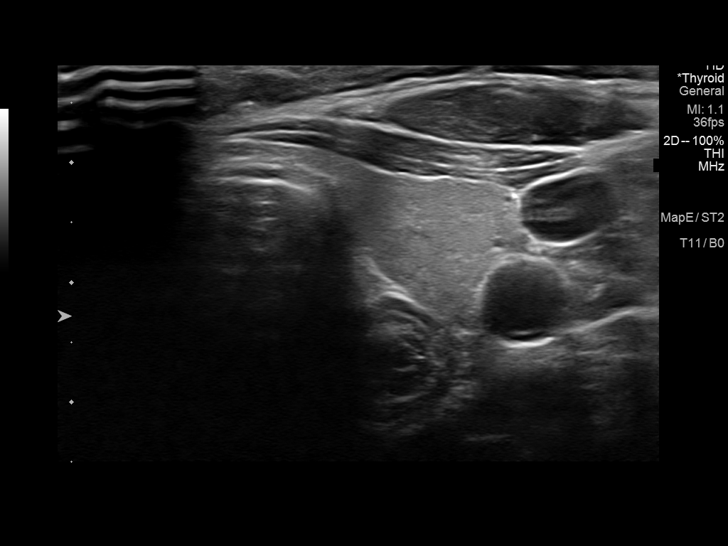
[im 26/44]
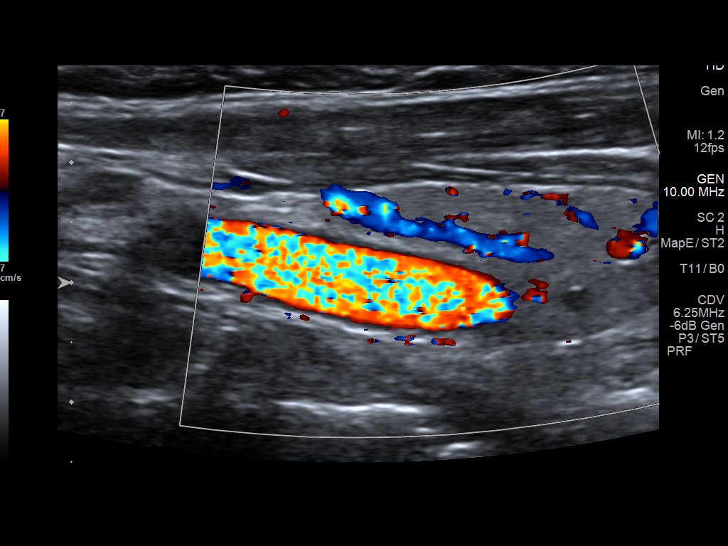
[im 29/44]
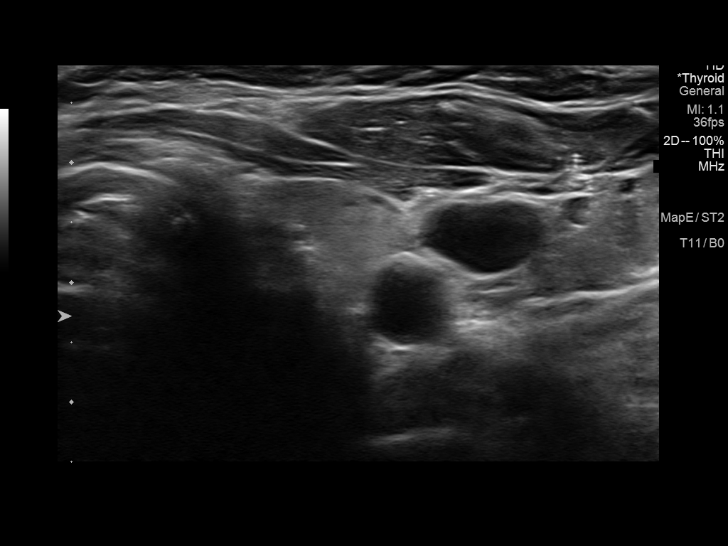
[im 33/44]
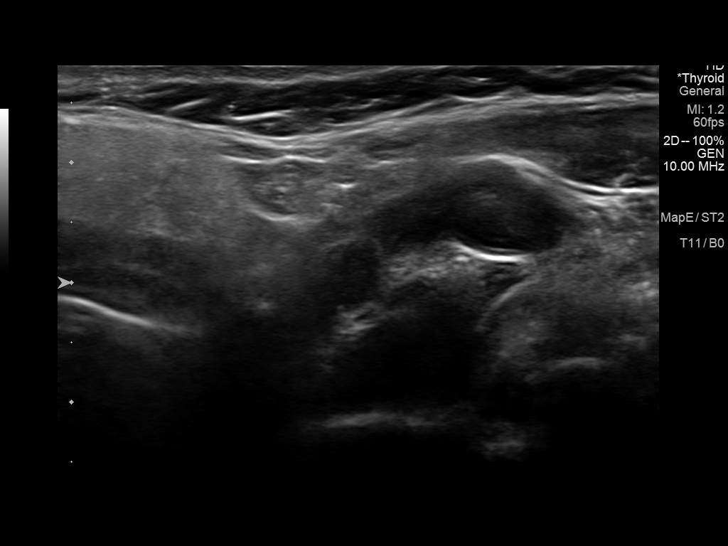
[im 36/44]
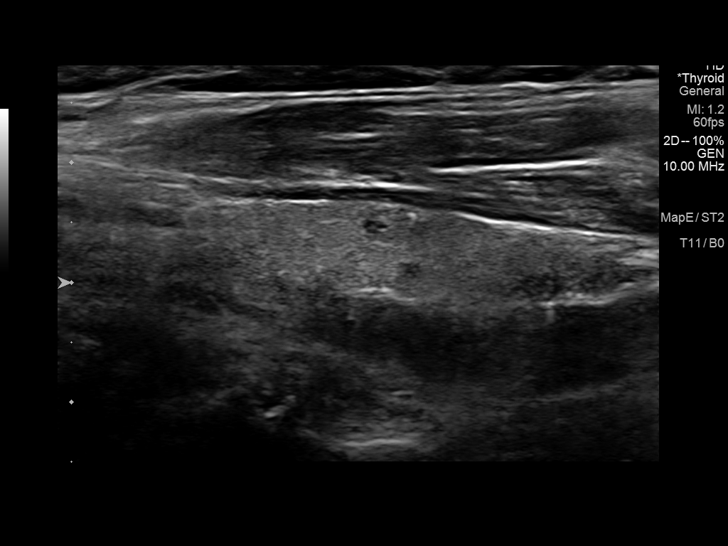
[im 40/44]
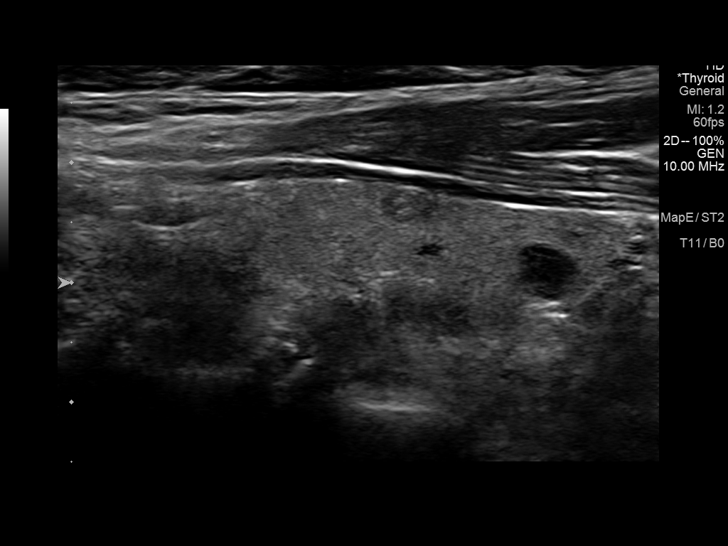
[im 44/44]
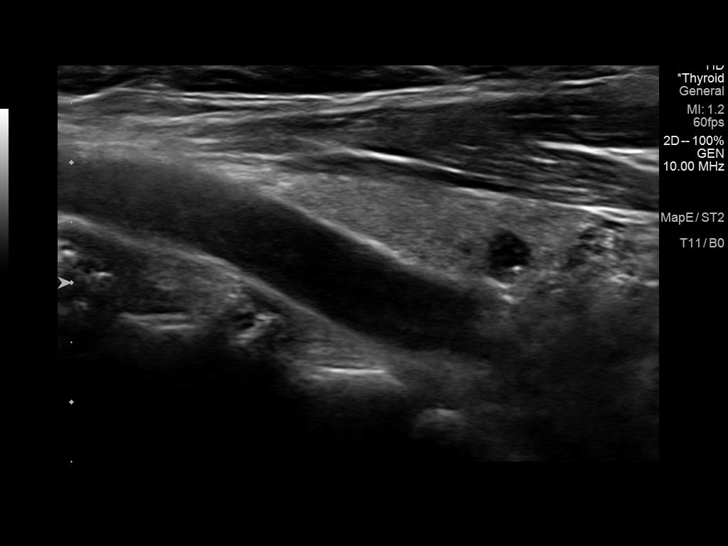

[13 of 25 positions shown; findings below may reference images not displayed]

FINDINGS: Parenchymal Echotexture: Mildly heterogenous

Isthmus: 0.4 cm

Right lobe: 3.4 x 1.0 x 1.5 cm

Left lobe: 3.9 x 1.4 x 1.5 cm

_________________________________________________________

Estimated total number of nodules >/= 1 cm: 0

Number of spongiform nodules >/=  2 cm not described below (TR1): 0

Number of mixed cystic and solid nodules >/= 1.5 cm not described
below (TR2): 0

_________________________________________________________

Previously noted partially cystic and partially solid 0.6 cm nodule
in the mid right lobe shows involution with small residual nodular
region measuring 0.4 cm. 0.4 cm simple cyst in the inferior right
lobe appears stable. No nodules are identified meeting criteria for
biopsy or dedicated follow-up.
IMPRESSION: Involution of partially cystic and partially solid 0.6 cm nodule in
the mid right lobe which now measures approximately 0.4 cm.
Otherwise stable scattered tiny nodules and cysts which do not meet
criteria for biopsy or dedicated follow-up.

The above is in keeping with the ACR TI-RADS recommendations - [HOSPITAL] 7537;[DATE].

## 2020-02-28 ENCOUNTER — Telehealth: Payer: Self-pay

## 2020-03-04 ENCOUNTER — Other Ambulatory Visit: Payer: Self-pay | Admitting: Internal Medicine

## 2020-03-10 ENCOUNTER — Encounter: Payer: Self-pay | Admitting: Internal Medicine

## 2020-04-04 ENCOUNTER — Telehealth: Payer: Self-pay | Admitting: Pharmacist

## 2020-04-04 NOTE — Chronic Care Management (AMB) (Signed)
Chronic Care Management Pharmacy Assistant   Name: AIKO BELKO  MRN: [id number] DOB: 11-Mar-1951  Reason for Encounter: Medication Review/ Care Coordination of [place]   Patient would like to start with [facility]. She has transferred her medication from [place]. Atorvastatin 20 mg- 1 tablet daily and Losartan- HCTZ 100/12/5 mg- 1 tablet daily. Patient also has a prescription for Ivermectin 3 mg that she would like filled, called [facility] to see if they can transfer medication over to [facility] but they do not see this medication on her profile. Spouse has a hard copy of prescription that he will send to me an I will forward to the pharmacy. Patient will be due for adherence delivery around 04/11/2020 for Losartan and around 05/03/2020 for Atorvastatin. Awaiting response from patient on Temazepam 15 mg- 1 tablet at bedtime as needed to see if she still uses and wants transferred over also. Patient's spouse notified me that she is no longer using the Temazepam 15 mg.  Leata Mouse, CPP notified.  PCP : Glendale Chard, MD  Allergies:   Allergies  Allergen Reactions  . Ace Inhibitors Cough    Medications: Outpatient Encounter Medications as of 04/04/2020  Medication Sig Note  . aspirin 81 MG tablet Take 81 mg by mouth daily.   [name] atorvastatin (LIPITOR) 20 MG tablet TAKE 1 TABLET BY MOUTH EVERY DAY   . calcium carbonate (OS-CAL) 600 MG TABS Take 600 mg by mouth 2 (two) times daily with a meal.   . cetirizine (ZYRTEC) 10 MG tablet Take 10 mg by mouth daily.   . Cholecalciferol (VITAMIN D PO) Take 1,000 mg by mouth daily.    [name] losartan-hydrochlorothiazide (HYZAAR) 100-12.5 MG tablet TAKE 1 TABLET BY MOUTH EVERY DAY   . Magnesium 250 MG TABS Take by mouth. 06/04/2015: Received from: [place]  . Multiple Vitamin (THERA) TABS Take 1 tablet by mouth daily. 06/09/2016: Received from: [place]: Take 1  tablet by mouth daily.  . Omega-3 Fatty Acids (FISH OIL) 1000 MG CAPS Take 1 capsule by mouth. 06/04/2015: Received from: [place]  . temazepam (RESTORIL) 15 MG capsule TAKE 1 CAPSULE BY MOUTH AT BEDTIME AS NEEDED FOR SLEEP    No facility-administered encounter medications on file as of 04/04/2020.    Current Diagnosis: Patient Active Problem List   Diagnosis Date Noted  . Allergic rhinitis due to other allergen 06/10/2017  . Depression 06/10/2017  . Pericarditis 06/10/2017  . Scarlet fever 06/10/2017  . Single skin nodule 06/25/2014  . Multiple thyroid nodules 01/08/2014  . Essential hypertension 12/25/2013  . H/O hyperparathyroidism 12/25/2013  . Abdominal pain, chronic, epigastric 05/09/2012  . Hypercalcemia 12/30/2008  . Family history of malignant neoplasm of breast 03/30/2006  . Fibrocystic breast disease 03/30/2006  . Enthesopathy of hip region 08/30/2005  . Esophageal reflux 12/27/2003  . Mixed hyperlipidemia 02/12/2003  . Osteopenia 01/26/2000     Follow-Up:  Coordination of Enhanced Pharmacy Services and Pharmacist Review- Patient has already asked [facility] to transfer prescription refills to [facility]. Information regarding Ivermectin is still pending.  04/07/2020- [facility] Follow up with patient via text, she is not sure why they have not [facility] and I also informed that there was not a prescription received for Ivermectin either. Patient states Ivermectin should be sent by [facility] soon.   04/10/2020- [facility], requested both Atorvastatin and Losartan/HCTZ to be faxed to  [facility] attn: [facility].   04/11/2020- [facility] did not receive the Atorvastatin, requested refill to be sent from PCP- Dr [facility] office. Atorvastatin 20 mg sent.   04/16/2020- Patient's spouse informed via text that [facility] has received Ivermectin 3 mg for patient and they will transfer to  [facility].  04/17/2020- As of today, prescription of Ivermectin has not been received by [facility]. Will follow up with [facility]. Patient's spouse aware. tm  [facility], they do have an Ivermectin prescription on file for patient but none in stock, asked to transfer to [facility], I was kept on hold for 10 mins waiting on pharmacist, will retry. Judithann Sheen, CPA called [facility], she spoke with April who told her that they faxed Ivermectin prescription over a few days ago. CPA asked pharmacy to fax again and they stated they no longer have it and do not have the fax confirmation for previous fax. Patient notified of this conversation and will follow up with pharmacy.  [name], [place] Pharmacist Assistant [phone]

## 2020-04-08 ENCOUNTER — Encounter: Payer: Self-pay | Admitting: Internal Medicine

## 2020-04-08 ENCOUNTER — Ambulatory Visit (INDEPENDENT_AMBULATORY_CARE_PROVIDER_SITE_OTHER): Payer: PPO | Admitting: Internal Medicine

## 2020-04-08 ENCOUNTER — Other Ambulatory Visit: Payer: Self-pay

## 2020-04-08 VITALS — BP 134/88 | HR 74 | Temp 98.1°F | Ht 63.8 in | Wt 157.6 lb

## 2020-04-08 DIAGNOSIS — Z0000 Encounter for general adult medical examination without abnormal findings: Secondary | ICD-10-CM

## 2020-04-08 DIAGNOSIS — E559 Vitamin D deficiency, unspecified: Secondary | ICD-10-CM | POA: Diagnosis not present

## 2020-04-08 DIAGNOSIS — I10 Essential (primary) hypertension: Secondary | ICD-10-CM

## 2020-04-08 DIAGNOSIS — F5101 Primary insomnia: Secondary | ICD-10-CM

## 2020-04-08 DIAGNOSIS — Z23 Encounter for immunization: Secondary | ICD-10-CM

## 2020-04-08 DIAGNOSIS — L989 Disorder of the skin and subcutaneous tissue, unspecified: Secondary | ICD-10-CM

## 2020-04-08 DIAGNOSIS — E782 Mixed hyperlipidemia: Secondary | ICD-10-CM

## 2020-04-08 DIAGNOSIS — F418 Other specified anxiety disorders: Secondary | ICD-10-CM

## 2020-04-08 DIAGNOSIS — R61 Generalized hyperhidrosis: Secondary | ICD-10-CM

## 2020-04-08 LAB — POCT UA - MICROALBUMIN
Albumin/Creatinine Ratio, Urine, POC: <30
Creatinine, POC: 100 mg/dL
Microalbumin Ur, POC: 10 mg/L

## 2020-04-08 LAB — POCT URINALYSIS DIPSTICK
Bilirubin, UA: NEGATIVE
Blood, UA: NEGATIVE
Glucose, UA: NEGATIVE
Ketones, UA: NEGATIVE
Nitrite, UA: NEGATIVE
Protein, UA: NEGATIVE
Spec Grav, UA: 1.02 (ref 1.010–1.025)
Urobilinogen, UA: 0.2 E.U./dL
pH, UA: 7.5 (ref 5.0–8.0)

## 2020-04-08 MED ORDER — ESCITALOPRAM OXALATE 10 MG PO TABS: 10.0000 mg | ORAL_TABLET | Freq: Every day | ORAL | 2 refills | Status: DC

## 2020-04-08 MED ORDER — DAYVIGO 5 MG PO TABS: 5.0000 mg | ORAL_TABLET | Freq: Every evening | ORAL | 0 refills | Status: DC | PRN

## 2020-04-08 NOTE — Progress Notes (Signed)
This visit occurred during the SARS-CoV-2 public health emergency.  Safety protocols were in place, including screening questions prior to the visit, additional usage of staff PPE, and extensive cleaning of exam room while observing appropriate contact time as indicated for disinfecting solutions.  Subjective:     Patient ID: Jessica Crosby , female    DOB: 27-Jan-1951 , 69 y.o.   MRN: [id number]   Chief Complaint  Patient presents with  . Annual Exam  . Hypertension    HPI  She is here today for a full physical examination.  She is followed by Melvia [facility] for her GYN exams. She reports being up to date with her exams. She denies headaches, chest pain and shortness of breath.   Hypertension This is a chronic problem. The current episode started more than 1 year ago. The problem has been gradually improving since onset. The problem is controlled. Pertinent negatives include no blurred vision, chest pain or palpitations. Risk factors for coronary artery disease include post-menopausal state. The current treatment provides moderate improvement. Compliance problems include exercise.      Past Medical History:  Diagnosis Date  . Arthritis   . GERD (gastroesophageal reflux disease)   . Hx of scarlet fever    as achild  . Hypertension   . Thyroid disease    hyperparathyroidism  . Vitamin D deficiency      Family History  Problem Relation Age of Onset  . Breast cancer Sister 61  . Prostate cancer Father 8  . Breast cancer Maternal Aunt 81  . Prostate cancer Paternal Uncle   . Throat cancer Paternal Grandfather        pipe smoker  . Breast cancer Sister 43  . Lymphoma Sister 4  . Leukemia Sister 43       as a result of chemotherapy from her lymphoma  . Cancer Sister 41       non hodgins lymphoma/ leukemia  . Pancreatic cancer Maternal Aunt 65  . Colon cancer Maternal Aunt        diagnosed in her 50s  . Thyroid cancer Maternal Aunt        dx in her 53s  .  Leukemia Maternal Aunt        diagnosed in her 3s  . Breast cancer Cousin        diagnosed in her 37s  . Prostate cancer Paternal Uncle      Current Outpatient Medications:  .  aspirin 81 MG tablet, Take 81 mg by mouth daily., Disp: , Rfl:  .  calcium carbonate (OS-CAL) 600 MG TABS, Take 600 mg by mouth 2 (two) times daily with a meal., Disp: , Rfl:  .  Cholecalciferol (VITAMIN D PO), Take 2,000 mg by mouth daily. , Disp: , Rfl:  .  Lemborexant (DAYVIGO) 5 MG TABS, Take 5 mg by mouth at bedtime as needed., Disp: 10 tablet, Rfl: 0 .  losartan-hydrochlorothiazide (HYZAAR) 100-12.5 MG tablet, TAKE 1 TABLET BY MOUTH EVERY DAY, Disp: 90 tablet, Rfl: 1 .  Magnesium 250 MG TABS, Take by mouth., Disp: , Rfl:  .  Multiple Vitamin (THERA) TABS, Take 1 tablet by mouth daily., Disp: , Rfl:  .  Omega-3 Fatty Acids (FISH OIL) 1000 MG CAPS, Take 1 capsule by mouth., Disp: , Rfl:  .  atorvastatin (LIPITOR) 20 MG tablet, Take 1 tablet (20 mg total) by mouth daily., Disp: 90 tablet, Rfl: 0 .  escitalopram (LEXAPRO) 10 MG tablet, Take 1 tablet (10 mg  total) by mouth daily., Disp: 30 tablet, Rfl: 2   Allergies  Allergen Reactions  . Ace Inhibitors Cough      The patient states she uses post menopausal status for birth control. Last LMP was Patient's last menstrual period was 06/21/1998.. Negative for Dysmenorrhea. Negative for: breast discharge, breast lump(s), breast pain and breast self exam. Associated symptoms include abnormal vaginal bleeding. Pertinent negatives include abnormal bleeding (hematology), anxiety, decreased libido, depression, difficulty falling sleep, dyspareunia, history of infertility, nocturia, sexual dysfunction, sleep disturbances, urinary incontinence, urinary urgency, vaginal discharge and vaginal itching. Diet regular.The patient states her exercise level is  moderate.  . The patient's tobacco use is:  Social History   Tobacco Use  Smoking Status Never Smoker  Smokeless  Tobacco Never Used  . She has been exposed to passive smoke. The patient's alcohol use is:  Social History   Substance and Sexual Activity  Alcohol Use No    Review of Systems  Constitutional: Negative.   HENT: Negative.   Eyes: Negative.  Negative for blurred vision.  Respiratory: Negative.   Cardiovascular: Negative.  Negative for chest pain and palpitations.  Gastrointestinal: Negative.   Endocrine: Negative.   Genitourinary: Negative.   Musculoskeletal: Negative.   Skin: Negative.   Allergic/Immunologic: Negative.   Neurological: Negative.   Hematological: Negative.   Psychiatric/Behavioral: Positive for dysphoric mood and sleep disturbance.     Today's Vitals   04/08/20 1122  BP: 134/88  Pulse: 74  Temp: 98.1 F (36.7 C)  TempSrc: Oral  Weight: 157 lb 9.6 oz (71.5 kg)  Height: 5' 3.8" (1.621 m)  PainSc: 0-No pain   Body mass index is 27.22 kg/m.  Wt Readings from Last 3 Encounters:  04/08/20 157 lb 9.6 oz (71.5 kg)  09/26/19 161 lb (73 kg)  09/26/19 161 lb 12.8 oz (73.4 kg)   Objective:  Physical Exam Vitals and nursing note reviewed.  Constitutional:      General: She is not in acute distress.    Appearance: Normal appearance. She is well-developed.  HENT:     Head: Normocephalic and atraumatic.     Right Ear: Hearing, tympanic membrane, ear canal and external ear normal. There is no impacted cerumen.     Left Ear: Hearing, tympanic membrane, ear canal and external ear normal. There is no impacted cerumen.     Nose:     Comments: Deferred, masked    Mouth/Throat:     Comments: Deferred, masked Eyes:     General: Lids are normal.     Extraocular Movements: Extraocular movements intact.     Conjunctiva/sclera: Conjunctivae normal.     Pupils: Pupils are equal, round, and reactive to light.     Funduscopic exam:    Right eye: No papilledema.        Left eye: No papilledema.  Neck:     Thyroid: No thyroid mass.     Vascular: No carotid bruit.   Cardiovascular:     Rate and Rhythm: Normal rate and regular rhythm.     Pulses: Normal pulses.     Heart sounds: Normal heart sounds. No murmur heard.   Pulmonary:     Effort: Pulmonary effort is normal.     Breath sounds: Normal breath sounds.  Chest:     Breasts: Tanner Score is 5.        Right: Normal.        Left: Normal.  Abdominal:     General: Abdomen is flat. Bowel sounds are  normal. There is no distension.     Palpations: Abdomen is soft.     Tenderness: There is no abdominal tenderness.  Genitourinary:    Comments: Deferred Musculoskeletal:        General: No swelling. Normal range of motion.     Cervical back: Full passive range of motion without pain, normal range of motion and neck supple.     Right lower leg: No edema.     Left lower leg: No edema.  Skin:    General: Skin is warm and dry.     Capillary Refill: Capillary refill takes less than 2 seconds.     Comments: R axillary skin lesion, hyperkeratotic, flesh colored and raised  Neurological:     General: No focal deficit present.     Mental Status: She is alert and oriented to person, place, and time.     Cranial Nerves: No cranial nerve deficit.     Sensory: No sensory deficit.  Psychiatric:        Mood and Affect: Mood normal.        Behavior: Behavior normal.        Thought Content: Thought content normal.        Judgment: Judgment normal.         Assessment And Plan:     1. Encounter for annual physical exam Comments: A full exam was performed. Importance of monthly self breast exam was performed. PATIENT IS ADVISED TO GET 30-45 MINUTES REGULAR EXERCISE NO LESS THAN FOUR TO FIVE DAYS PER WEEK - BOTH WEIGHTBEARING EXERCISES AND AEROBIC ARE RECOMMENDED.  PATIENT IS ADVISED TO FOLLOW A HEALTHY DIET WITH AT LEAST SIX FRUITS/VEGGIES PER DAY, DECREASE INTAKE OF RED MEAT, AND TO INCREASE FISH INTAKE TO TWO DAYS PER WEEK.  MEATS/FISH SHOULD NOT BE FRIED, BAKED OR BROILED IS PREFERABLE.  I SUGGEST WEARING  SPF 50 SUNSCREEN ON EXPOSED PARTS AND ESPECIALLY WHEN IN THE DIRECT SUNLIGHT FOR AN EXTENDED PERIOD OF TIME.  PLEASE AVOID FAST FOOD RESTAURANTS AND INCREASE YOUR WATER INTAKE.   2. Essential hypertension Comments: Chronic, fair control. She will continue with current meds for now. Encouraged to follow low sodium diet. EKG performed, no acute changes.She will f/u 6 months. - CBC - CMP14+EGFR - Lipid panel - TSH - POCT Urinalysis Dipstick (81002) - POCT UA - Microalbumin - EKG 12-Lead  3. Night sweats Comments: Unfortunately, these have recurred. She should consider Estroven or Remifemin. WE both agrees that she will not resume HRT.  - TSH  4. Primary insomnia Comments: Chronic. She was given rx Dayvigo 26m to take nightly as needed. Reminded that it is important to have bedtime routine.   5. Situational anxiety Comments: She agrees to start meds. Rx escitalopram 151mwas sent to her local pharmacy. She is aware that this medication may also help with hot flashes. She will rto in six weeks for re-evaluation.   6. Vitamin D deficiency disease Comments: I will check vitamin D level and supplement as needed.  - VITAMIN D 25 Hydroxy (Vit-D Deficiency, Fractures)  7. Skin lesion Chronic, I will refer her to Derm for further evaluation.  - Ambulatory referral to Dermatology  8. Need for vaccination Comments: She was given high dose flu vaccine.  - Flu Vaccine QUAD High Dose(Fluad)     Patient was given opportunity to ask questions. Patient verbalized understanding of the plan and was able to repeat key elements of the plan. All questions were answered to their satisfaction.  Maximino Greenland, MD   I, Maximino Greenland, MD, have reviewed all documentation for this visit. The documentation on 04/13/20 for the exam, diagnosis, procedures, and orders are all accurate and complete.  THE PATIENT IS ENCOURAGED TO PRACTICE SOCIAL DISTANCING DUE TO THE COVID-19 PANDEMIC.  ,

## 2020-04-08 NOTE — Patient Instructions (Signed)
Health Maintenance After Age 69 After age 69, you are at a higher risk for certain long-term diseases and infections as well as injuries from falls. Falls are a major cause of broken bones and head injuries in people who are older than age 69. Getting regular preventive care can help to keep you healthy and well. Preventive care includes getting regular testing and making lifestyle changes as recommended by your health care provider. Talk with your health care provider about:  Which screenings and tests you should have. A screening is a test that checks for a disease when you have no symptoms.  A diet and exercise plan that is right for you. What should I know about screenings and tests to prevent falls? Screening and testing are the best ways to find a health problem early. Early diagnosis and treatment give you the best chance of managing medical conditions that are common after age 69. Certain conditions and lifestyle choices may make you more likely to have a fall. Your health care provider may recommend:  Regular vision checks. Poor vision and conditions such as cataracts can make you more likely to have a fall. If you wear glasses, make sure to get your prescription updated if your vision changes.  Medicine review. Work with your health care provider to regularly review all of the medicines you are taking, including over-the-counter medicines. Ask your health care provider about any side effects that may make you more likely to have a fall. Tell your health care provider if any medicines that you take make you feel dizzy or sleepy.  Osteoporosis screening. Osteoporosis is a condition that causes the bones to get weaker. This can make the bones weak and cause them to break more easily.  Blood pressure screening. Blood pressure changes and medicines to control blood pressure can make you feel dizzy.  Strength and balance checks. Your health care provider may recommend certain tests to check your  strength and balance while standing, walking, or changing positions.  Foot health exam. Foot pain and numbness, as well as not wearing proper footwear, can make you more likely to have a fall.  Depression screening. You may be more likely to have a fall if you have a fear of falling, feel emotionally low, or feel unable to do activities that you used to do.  Alcohol use screening. Using too much alcohol can affect your balance and may make you more likely to have a fall. What actions can I take to lower my risk of falls? General instructions  Talk with your health care provider about your risks for falling. Tell your health care provider if: ? You fall. Be sure to tell your health care provider about all falls, even ones that seem minor. ? You feel dizzy, sleepy, or off-balance.  Take over-the-counter and prescription medicines only as told by your health care provider. These include any supplements.  Eat a healthy diet and maintain a healthy weight. A healthy diet includes low-fat dairy products, low-fat (lean) meats, and fiber from whole grains, beans, and lots of fruits and vegetables. Home safety  Remove any tripping hazards, such as rugs, cords, and clutter.  Install safety equipment such as grab bars in bathrooms and safety rails on stairs.  Keep rooms and walkways well-lit. Activity   Follow a regular exercise program to stay fit. This will help you maintain your balance. Ask your health care provider what types of exercise are appropriate for you.  If you need a cane or   walker, use it as recommended by your health care provider.  Wear supportive shoes that have nonskid soles. Lifestyle  Do not drink alcohol if your health care provider tells you not to drink.  If you drink alcohol, limit how much you have: ? 0-1 drink a day for women. ? 0-2 drinks a day for men.  Be aware of how much alcohol is in your drink. In the [place] one drink equals one typical bottle of beer (12  oz), one-half glass of wine (5 oz), or one shot of hard liquor (1 oz).  Do not use any products that contain nicotine or tobacco, such as cigarettes and e-cigarettes. If you need help quitting, ask your health care provider. Summary  Having a healthy lifestyle and getting preventive care can help to protect your health and wellness after age 69.  Screening and testing are the best way to find a health problem early and help you avoid having a fall. Early diagnosis and treatment give you the best chance for managing medical conditions that are more common for people who are older than age 69.  Falls are a major cause of broken bones and head injuries in people who are older than age 69. Take precautions to prevent a fall at home.  Work with your health care provider to learn what changes you can make to improve your health and wellness and to prevent falls. This information is not intended to replace advice given to you by your health care provider. Make sure you discuss any questions you have with your health care provider. Document Revised: 09/28/2018 Document Reviewed: 04/20/2017 Elsevier Patient Education  2020 Elsevier Inc.  

## 2020-04-09 LAB — CMP14+EGFR
ALT: 31 IU/L (ref 0–32)
AST: 21 IU/L (ref 0–40)
Albumin/Globulin Ratio: 2.5 — ABNORMAL HIGH (ref 1.2–2.2)
Albumin: 4.8 g/dL (ref 3.8–4.8)
Alkaline Phosphatase: 87 IU/L (ref 44–121)
BUN/Creatinine Ratio: 13 (ref 12–28)
BUN: 13 mg/dL (ref 8–27)
Bilirubin Total: 0.7 mg/dL (ref 0.0–1.2)
CO2: 26 mmol/L (ref 20–29)
Calcium: 9.8 mg/dL (ref 8.7–10.3)
Chloride: 101 mmol/L (ref 96–106)
Creatinine, Ser: 0.97 mg/dL (ref 0.57–1.00)
GFR calc Af Amer: 69 mL/min/{1.73_m2} (ref >59)
GFR calc non Af Amer: 60 mL/min/{1.73_m2} (ref >59)
Globulin, Total: 1.9 g/dL (ref 1.5–4.5)
Glucose: 85 mg/dL (ref 65–99)
Potassium: 4.1 mmol/L (ref 3.5–5.2)
Sodium: 142 mmol/L (ref 134–144)
Total Protein: 6.7 g/dL (ref 6.0–8.5)

## 2020-04-09 LAB — TSH: TSH: 1.51 u[IU]/mL (ref 0.450–4.500)

## 2020-04-09 LAB — CBC
Hematocrit: 44.3 % (ref 34.0–46.6)
Hemoglobin: 14.6 g/dL (ref 11.1–15.9)
MCH: 30.1 pg (ref 26.6–33.0)
MCHC: 33 g/dL (ref 31.5–35.7)
MCV: 91 fL (ref 79–97)
Platelets: 295 10*3/uL (ref 150–450)
RBC: 4.85 x10E6/uL (ref 3.77–5.28)
RDW: 12.4 % (ref 11.7–15.4)
WBC: 6 10*3/uL (ref 3.4–10.8)

## 2020-04-09 LAB — LIPID PANEL
Chol/HDL Ratio: 2.3 ratio (ref 0.0–4.4)
Cholesterol, Total: 171 mg/dL (ref 100–199)
HDL: 73 mg/dL (ref >39)
LDL Chol Calc (NIH): 82 mg/dL (ref 0–99)
Triglycerides: 90 mg/dL (ref 0–149)
VLDL Cholesterol Cal: 16 mg/dL (ref 5–40)

## 2020-04-09 LAB — VITAMIN D 25 HYDROXY (VIT D DEFICIENCY, FRACTURES): Vit D, 25-Hydroxy: 49.5 ng/mL (ref 30.0–100.0)

## 2020-04-10 ENCOUNTER — Telehealth: Payer: Self-pay

## 2020-04-10 NOTE — Telephone Encounter (Cosign Needed Addendum)
  Chronic Care Management   Outreach Note  04/10/2020 Name: Jessica Crosby MRN: [id number] DOB: Nov 02, 1950  Referred by: Glendale Chard, MD Reason for referral : Chronic Care Management (CCM RNCM FU Call )   A second unsuccessful telephone outreach was attempted today. The patient was referred to the case management team for assistance with care management and care coordination.   Follow Up Plan: A HIPAA compliant phone message was left for the patient providing contact information and requesting a return call.  Telephone follow up appointment with care management team member scheduled for: 05/21/20  Barb Merino, RN, BSN, CCM Care Management Coordinator [facility]  Direct Phone: [phone]

## 2020-04-11 ENCOUNTER — Other Ambulatory Visit: Payer: Self-pay

## 2020-04-11 MED ORDER — ATORVASTATIN CALCIUM 20 MG PO TABS
20.0000 mg | ORAL_TABLET | Freq: Every day | ORAL | 0 refills | Status: DC
Start: 2020-04-11 — End: 2020-10-28

## 2020-04-13 DIAGNOSIS — F5101 Primary insomnia: Secondary | ICD-10-CM | POA: Insufficient documentation

## 2020-04-13 DIAGNOSIS — F418 Other specified anxiety disorders: Secondary | ICD-10-CM | POA: Insufficient documentation

## 2020-04-13 DIAGNOSIS — E559 Vitamin D deficiency, unspecified: Secondary | ICD-10-CM | POA: Insufficient documentation

## 2020-04-13 DIAGNOSIS — R61 Generalized hyperhidrosis: Secondary | ICD-10-CM | POA: Insufficient documentation

## 2020-04-16 ENCOUNTER — Encounter: Payer: Self-pay | Admitting: Internal Medicine

## 2020-05-06 ENCOUNTER — Telehealth: Payer: Self-pay

## 2020-05-06 NOTE — Chronic Care Management (AMB) (Cosign Needed)
Chronic Care Management Pharmacy Assistant   Name: Jessica Crosby  MRN: [id number] DOB: Mar 20, 1951  Reason for Encounter: Medication Review  Patient Questions:  1.  Have you seen any other providers since your last visit? Yes, 04/08/2020- Dr Baird Cancer (PCP)  2.  Any changes in your medicines or health? Yes, 04/08/2020- Escitalopram 10 mg added, Lemborexant 5 mg changed to taking 1 at bedtime PRN, Cetirizine 10 mg was discontinued, Temazepam 15 mg was discontinued.   PCP : Glendale Chard, MD  Allergies:   Allergies  Allergen Reactions   Ace Inhibitors Cough    Medications: Outpatient Encounter Medications as of 05/06/2020  Medication Sig Note   aspirin 81 MG tablet Take 81 mg by mouth daily.    atorvastatin (LIPITOR) 20 MG tablet Take 1 tablet (20 mg total) by mouth daily.    calcium carbonate (OS-CAL) 600 MG TABS Take 600 mg by mouth 2 (two) times daily with a meal.    Cholecalciferol (VITAMIN D PO) Take 2,000 mg by mouth daily.     escitalopram (LEXAPRO) 10 MG tablet Take 1 tablet (10 mg total) by mouth daily.    Lemborexant (DAYVIGO) 5 MG TABS Take 5 mg by mouth at bedtime as needed.    losartan-hydrochlorothiazide (HYZAAR) 100-12.5 MG tablet TAKE 1 TABLET BY MOUTH EVERY DAY    Magnesium 250 MG TABS Take by mouth. 06/04/2015: Received from: [place]   Multiple Vitamin (THERA) TABS Take 1 tablet by mouth daily. 06/09/2016: Received from: [place]: Take 1 tablet by mouth daily.   Omega-3 Fatty Acids (FISH OIL) 1000 MG CAPS Take 1 capsule by mouth. 06/04/2015: Received from: [place]   No facility-administered encounter medications on file as of 05/06/2020.    Current Diagnosis: Patient Active Problem List   Diagnosis Date Noted   Night sweats 04/13/2020   Primary insomnia 04/13/2020   Situational anxiety 04/13/2020   Vitamin D deficiency disease 04/13/2020   Allergic rhinitis due to other allergen 06/10/2017    Depression 06/10/2017   Pericarditis 06/10/2017   Scarlet fever 06/10/2017   Single skin nodule 06/25/2014   Multiple thyroid nodules 01/08/2014   Essential hypertension 12/25/2013   H/O hyperparathyroidism 12/25/2013   Abdominal pain, chronic, epigastric 05/09/2012   Hypercalcemia 12/30/2008   Family history of malignant neoplasm of breast 03/30/2006   Fibrocystic breast disease 03/30/2006   Enthesopathy of hip region 08/30/2005   Esophageal reflux 12/27/2003   Mixed hyperlipidemia 02/12/2003   Osteopenia 01/26/2000   Reviewed chart for medication changes ahead of medication coordination call.  OV- 04/08/2020- Dr Baird Cancer (PCP) since last care coordination call/Pharmacist visit.   Medication changes indicated:04/08/2020- Escitalopram 10 mg added, Lemborexant 5 mg changed to taking 1 at bedtime PRN, Cetirizine 10 mg was discontinued, Temazepam 15 mg was discontinued.    BP Readings from Last 3 Encounters:  04/08/20 134/88  09/26/19 128/84  09/26/19 128/84    Lab Results  Component Value Date   HGBA1C 5.3 09/26/2019     Patient obtains medications through [facility]  90 Days   Last adherence delivery included: Losartan Hydrochlorothiazide 100/12.5 mg- 1 tablet daily, Ivermectin 3 mg- take 4 tablets once a week for three weeks.    Patient declined Temazepam 15 mg due to no longer taking.  Patient is due for next adherence delivery on: 05/07/2020. 05/06/2020- Left message to return call. 05/07/2020- Patient returned call, reviewed medications and coordinated delivery. Patient is not out of any medications and is ok with 05/08/2020  delivery.  This delivery to include: Escitalopram 10 mg- 1 tablet daily Dayvigo 5 mg- 1 tablet at bedtimes as needed Losartan 100 mg- Hydrochlorothiazide 12.5 mg- 1 tablet daily  No short fill or acute fill form needed.  Patient declined the following medications Atorvastatin 20 mg due to receiving a 90 day supply on  04/11/2020.  Patient needs refills for Escitalopram and Dayvigo .  Confirmed delivery date of 05/08/2020, advised patient that pharmacy will contact them the morning of delivery.  Follow-Up:  Coordination of Enhanced Pharmacy Services and Pharmacist Review  Refill request sent for  Escitalopram and Dayvigo. Orlando Penner, CPP notified.  [name], [place] Pharmacist Assistant [phone]

## 2020-05-07 ENCOUNTER — Other Ambulatory Visit: Payer: Self-pay | Admitting: Internal Medicine

## 2020-05-07 ENCOUNTER — Other Ambulatory Visit: Payer: Self-pay

## 2020-05-07 MED ORDER — DAYVIGO 5 MG PO TABS: 5.0000 mg | ORAL_TABLET | Freq: Every evening | ORAL | 3 refills | Status: DC | PRN

## 2020-05-07 MED ORDER — LOSARTAN POTASSIUM-HCTZ 100-12.5 MG PO TABS
1.0000 | ORAL_TABLET | Freq: Every day | ORAL | 1 refills | Status: DC
Start: 2020-05-07 — End: 2020-08-20

## 2020-05-09 ENCOUNTER — Telehealth: Payer: Self-pay

## 2020-05-09 NOTE — Telephone Encounter (Signed)
The pt was notified that samples of Jessica Crosby is ready for pickup and she can pick them up on Monday.

## 2020-05-20 ENCOUNTER — Telehealth: Payer: Self-pay

## 2020-05-20 NOTE — Chronic Care Management (AMB) (Signed)
° ° °  Chronic Care Management Pharmacy Assistant   Name: Jessica Crosby  MRN: [id number] DOB: 07-17-1950  Reason for Encounter: Medication Review    PCP : Glendale Chard, MD  Allergies:   Allergies  Allergen Reactions   Ace Inhibitors Cough    Medications: Outpatient Encounter Medications as of 05/20/2020  Medication Sig Note   aspirin 81 MG tablet Take 81 mg by mouth daily.    atorvastatin (LIPITOR) 20 MG tablet Take 1 tablet (20 mg total) by mouth daily.    calcium carbonate (OS-CAL) 600 MG TABS Take 600 mg by mouth 2 (two) times daily with a meal.    Cholecalciferol (VITAMIN D PO) Take 2,000 mg by mouth daily.     escitalopram (LEXAPRO) 10 MG tablet Take 1 tablet (10 mg total) by mouth daily.    Lemborexant (DAYVIGO) 5 MG TABS Take 5 mg by mouth at bedtime as needed.    losartan-hydrochlorothiazide (HYZAAR) 100-12.5 MG tablet Take 1 tablet by mouth daily.    Magnesium 250 MG TABS Take by mouth. 06/04/2015: Received from: [place]   Multiple Vitamin (THERA) TABS Take 1 tablet by mouth daily. 06/09/2016: Received from: [place]: Take 1 tablet by mouth daily.   Omega-3 Fatty Acids (FISH OIL) 1000 MG CAPS Take 1 capsule by mouth. 06/04/2015: Received from: [place]   No facility-administered encounter medications on file as of 05/20/2020.    Current Diagnosis: Patient Active Problem List   Diagnosis Date Noted   Night sweats 04/13/2020   Primary insomnia 04/13/2020   Situational anxiety 04/13/2020   Vitamin D deficiency disease 04/13/2020   Allergic rhinitis due to other allergen 06/10/2017   Depression 06/10/2017   Pericarditis 06/10/2017   Scarlet fever 06/10/2017   Single skin nodule 06/25/2014   Multiple thyroid nodules 01/08/2014   Essential hypertension 12/25/2013   H/O hyperparathyroidism 12/25/2013   Abdominal pain, chronic, epigastric 05/09/2012   Hypercalcemia 12/30/2008   Family history of  malignant neoplasm of breast 03/30/2006   Fibrocystic breast disease 03/30/2006   Enthesopathy of hip region 08/30/2005   Esophageal reflux 12/27/2003   Mixed hyperlipidemia 02/12/2003   Osteopenia 01/26/2000      Follow-Up:  Pharmacist Review Reviewed chart and adherence measures. Per Insurance data medication adherence for hypertension 100% med compliance.   Orlando Penner, CPP, notified  Judithann Sheen, [facility] Clinical Pharmacist Assistant [phone]

## 2020-05-21 ENCOUNTER — Ambulatory Visit (INDEPENDENT_AMBULATORY_CARE_PROVIDER_SITE_OTHER): Payer: PPO | Admitting: Internal Medicine

## 2020-05-21 ENCOUNTER — Other Ambulatory Visit: Payer: Self-pay

## 2020-05-21 ENCOUNTER — Encounter: Payer: Self-pay | Admitting: Internal Medicine

## 2020-05-21 VITALS — BP 128/76 | HR 85 | Temp 98.5°F | Ht 63.8 in | Wt 158.4 lb

## 2020-05-21 DIAGNOSIS — F5101 Primary insomnia: Secondary | ICD-10-CM | POA: Diagnosis not present

## 2020-05-21 DIAGNOSIS — R61 Generalized hyperhidrosis: Secondary | ICD-10-CM

## 2020-05-21 DIAGNOSIS — F418 Other specified anxiety disorders: Secondary | ICD-10-CM

## 2020-05-21 NOTE — Progress Notes (Signed)
I,Katawbba Wiggins,acting as a Education administrator for Maximino Greenland, MD.,have documented all relevant documentation on the behalf of Maximino Greenland, MD,as directed by  Maximino Greenland, MD while in the presence of Maximino Greenland, MD.  This visit occurred during the SARS-CoV-2 public health emergency.  Safety protocols were in place, including screening questions prior to the visit, additional usage of staff PPE, and extensive cleaning of exam room while observing appropriate contact time as indicated for disinfecting solutions.  Subjective:     Patient ID: Jessica Crosby , female    DOB: 1951/04/01 , 69 y.o.   MRN: [id number]   Chief Complaint  Patient presents with  . Night Sweats    HPI  The patient is here today for a follow-up on hot flashes.  The patient was started on Lexapro 10 mg daily at her last visit. She reports her sx have improved greatly. Additionally, she is happy to state her family issues are resolving.     Past Medical History:  Diagnosis Date  . Arthritis   . GERD (gastroesophageal reflux disease)   . Hx of scarlet fever    as achild  . Hypertension   . Thyroid disease    hyperparathyroidism  . Vitamin D deficiency      Family History  Problem Relation Age of Onset  . Breast cancer Sister 26  . Prostate cancer Father 28  . Breast cancer Maternal Aunt 81  . Prostate cancer Paternal Uncle   . Throat cancer Paternal Grandfather        pipe smoker  . Breast cancer Sister 66  . Lymphoma Sister 70  . Leukemia Sister 61       as a result of chemotherapy from her lymphoma  . Cancer Sister 53       non hodgins lymphoma/ leukemia  . Pancreatic cancer Maternal Aunt 65  . Colon cancer Maternal Aunt        diagnosed in her 34s  . Thyroid cancer Maternal Aunt        dx in her 66s  . Leukemia Maternal Aunt        diagnosed in her 30s  . Breast cancer Cousin        diagnosed in her 15s  . Prostate cancer Paternal Uncle      Current Outpatient Medications:   .  aspirin 81 MG tablet, Take 81 mg by mouth daily., Disp: , Rfl:  .  atorvastatin (LIPITOR) 20 MG tablet, Take 1 tablet (20 mg total) by mouth daily., Disp: 90 tablet, Rfl: 0 .  calcium carbonate (OS-CAL) 600 MG TABS, Take 600 mg by mouth 2 (two) times daily with a meal., Disp: , Rfl:  .  Cholecalciferol (VITAMIN D PO), Take 2,000 mg by mouth daily. , Disp: , Rfl:  .  escitalopram (LEXAPRO) 10 MG tablet, Take 1 tablet (10 mg total) by mouth daily., Disp: 90 tablet, Rfl: 1 .  Lemborexant (DAYVIGO) 5 MG TABS, Take 5 mg by mouth at bedtime as needed., Disp: 30 tablet, Rfl: 3 .  losartan-hydrochlorothiazide (HYZAAR) 100-12.5 MG tablet, Take 1 tablet by mouth daily., Disp: 90 tablet, Rfl: 1 .  Magnesium 250 MG TABS, Take by mouth., Disp: , Rfl:  .  Multiple Vitamin (THERA) TABS, Take 1 tablet by mouth daily., Disp: , Rfl:  .  Omega-3 Fatty Acids (FISH OIL) 1000 MG CAPS, Take 1 capsule by mouth., Disp: , Rfl:    Allergies  Allergen Reactions  . Ace Inhibitors  Cough     Review of Systems  Constitutional: Negative.   Respiratory: Negative.   Cardiovascular: Negative.   Gastrointestinal: Negative.   Psychiatric/Behavioral: Positive for sleep disturbance.  All other systems reviewed and are negative.    Today's Vitals   05/21/20 1206  BP: 128/76  Pulse: 85  Temp: 98.5 F (36.9 C)  TempSrc: Oral  Weight: 158 lb 6.4 oz (71.8 kg)  Height: 5' 3.8" (1.621 m)  PainSc: 0-No pain   Body mass index is 27.36 kg/m.  Wt Readings from Last 3 Encounters:  05/21/20 158 lb 6.4 oz (71.8 kg)  04/08/20 157 lb 9.6 oz (71.5 kg)  09/26/19 161 lb (73 kg)   Objective:  Physical Exam Vitals and nursing note reviewed.  Constitutional:      Appearance: Normal appearance.  HENT:     Head: Normocephalic and atraumatic.  Cardiovascular:     Rate and Rhythm: Normal rate and regular rhythm.     Heart sounds: Normal heart sounds.  Pulmonary:     Breath sounds: Normal breath sounds.  Skin:    General:  Skin is warm.  Neurological:     General: No focal deficit present.     Mental Status: She is alert and oriented to person, place, and time.         Assessment And Plan:     1. Night sweats Comments: Improved. She will c/w current meds. REfill will be sent to her local pharmacy.  2. Situational anxiety Comments: This has also improved. She will continue with escitalopram. Advised to take meds for at least 4 months. Refill sent to pharmacy.   3. Primary insomnia Comments: Chronic.  She was given samples of Belsomra to take nightly prn. She will let me know if these are effective for her. Reminded to have bedtime routine.      Patient was given opportunity to ask questions. Patient verbalized understanding of the plan and was able to repeat key elements of the plan. All questions were answered to their satisfaction.  Maximino Greenland, MD   I, Maximino Greenland, MD, have reviewed all documentation for this visit. The documentation on 06/08/20 for the exam, diagnosis, procedures, and orders are all accurate and complete.  THE PATIENT IS ENCOURAGED TO PRACTICE SOCIAL DISTANCING DUE TO THE COVID-19 PANDEMIC.

## 2020-05-22 ENCOUNTER — Telehealth: Payer: PPO

## 2020-05-22 ENCOUNTER — Telehealth: Payer: Self-pay

## 2020-05-22 NOTE — Telephone Encounter (Cosign Needed)
  Chronic Care Management   Outreach Note  05/22/2020 Name: Jessica Crosby MRN: [id number] DOB: 06-23-1950  Referred by: Glendale Chard, MD Reason for referral : Chronic Care Management (RNCM FU Call)   An unsuccessful telephone outreach was attempted today. The patient was referred to the case management team for assistance with care management and care coordination.   Follow Up Plan: A HIPAA compliant phone message was left for the patient providing contact information and requesting a return call. Telephone follow up appointment with care management team member scheduled for: 07/02/20  Barb Merino, RN, BSN, CCM Care Management Coordinator [facility]  Direct Phone: [phone]

## 2020-06-08 MED ORDER — ESCITALOPRAM OXALATE 10 MG PO TABS: 10.0000 mg | ORAL_TABLET | Freq: Every day | ORAL | 1 refills | Status: DC

## 2020-06-09 ENCOUNTER — Telehealth (HOSPITAL_COMMUNITY): Payer: Self-pay | Admitting: Family

## 2020-06-09 ENCOUNTER — Other Ambulatory Visit: Payer: Self-pay | Admitting: Nurse Practitioner

## 2020-06-09 ENCOUNTER — Telehealth: Payer: Self-pay

## 2020-06-09 DIAGNOSIS — U071 COVID-19: Secondary | ICD-10-CM

## 2020-06-09 NOTE — Telephone Encounter (Signed)
Called to discuss with [place] Sizer about Covid symptoms and potential candidacy for the use of sotrovimab, a combination monoclonal antibody infusion for those with mild to moderate Covid symptoms and at a high risk of hospitalization.     Pt is qualified for this infusion at the infusion center due to co-morbid conditions and/or a member of an at-risk group, however unable to reach patient. VM left.   Jayce Kainz,NP

## 2020-06-09 NOTE — Progress Notes (Signed)
I connected by phone with patient to discuss the potential use of a new treatment for mild to moderate COVID-19 viral infection in non-hospitalized patients.   1. This patient is a that meets the FDA criteria for Emergency Use Authorization of COVID monoclonal antibody .Has a (+) direct SARS-CoV-2 viral test result 2. Has mild or moderate COVID-19  3. Is NOT hospitalized due to COVID-19 4. Is within 10 days of symptom onset 5. Has at least one of the high risk factor(s) for progression to severe COVID-19 and/or hospitalization as defined in EUA. ? Specific high risk criteria : Diabetes, hypertension, obesity     I have spoken and communicated the following to the patient or parent/caregiver regarding COVID monoclonal antibody treatment:   1. FDA has authorized the emergency use for the treatment of high risk post-exposure prophylaxis for COVID19.    2. The significant known and potential risks and benefits of COVID monoclonal antibody, and the extent to which such potential risks and benefits are unknown.       After reviewing this information with the patient, The patient agreed to proceed with receiving monoclonal antibody infusion and will be provided a copy of the Fact sheet prior to receiving the infusion. Mariana Kaufman, NP

## 2020-06-09 NOTE — Telephone Encounter (Signed)
Patient referred to infusion clinic for positive covid test on 06/08/20

## 2020-06-10 ENCOUNTER — Ambulatory Visit (HOSPITAL_COMMUNITY)
Admission: RE | Admit: 2020-06-10 | Discharge: 2020-06-10 | Disposition: A | Discharge status: Home / Self Care | Payer: Medicare Other | Source: Ambulatory Visit | Attending: Pulmonary Disease | Admitting: Pulmonary Disease

## 2020-06-10 DIAGNOSIS — U071 COVID-19: Secondary | ICD-10-CM | POA: Insufficient documentation

## 2020-06-10 DIAGNOSIS — Z23 Encounter for immunization: Secondary | ICD-10-CM | POA: Insufficient documentation

## 2020-06-10 MED ORDER — SODIUM CHLORIDE 0.9 % IV SOLN: INTRAVENOUS | Status: DC | PRN

## 2020-06-10 MED ORDER — EPINEPHRINE 0.3 MG/0.3ML IJ SOAJ: 0.3000 mg | Freq: Once | INTRAMUSCULAR | Status: DC | PRN

## 2020-06-10 MED ORDER — DIPHENHYDRAMINE HCL 50 MG/ML IJ SOLN: 50.0000 mg | Freq: Once | INTRAMUSCULAR | Status: DC | PRN

## 2020-06-10 MED ORDER — ALBUTEROL SULFATE HFA 108 (90 BASE) MCG/ACT IN AERS: 2.0000 | INHALATION_SPRAY | Freq: Once | RESPIRATORY_TRACT | Status: DC | PRN

## 2020-06-10 MED ORDER — SODIUM CHLORIDE 0.9 % IV SOLN: Freq: Once | INTRAVENOUS | Status: AC

## 2020-06-10 MED ORDER — METHYLPREDNISOLONE SODIUM SUCC 125 MG IJ SOLR: 125.0000 mg | Freq: Once | INTRAMUSCULAR | Status: DC | PRN

## 2020-06-10 MED ORDER — FAMOTIDINE IN NACL 20-0.9 MG/50ML-% IV SOLN: 20.0000 mg | Freq: Once | INTRAVENOUS | Status: DC | PRN

## 2020-06-10 NOTE — Progress Notes (Incomplete)
  Diagnosis: COVID-19  Physician:Dr. Joya Gaskins  Procedure: Covid Infusion Clinic Med: bamlanivimab\etesevimab infusion - Provided patient with bamlanimivab\etesevimab fact sheet for patients, parents and caregivers prior to infusion.  Complications: No immediate complications noted.  Discharge: Discharged home   Ludwig Lean 06/10/2020

## 2020-06-10 NOTE — Discharge Instructions (Signed)
10 Things You Can Do to Manage Your COVID-19 Symptoms at Home If you have possible or confirmed COVID-19: 1. Stay home from work and school. And stay away from other public places. If you must go out, avoid using any kind of public transportation, ridesharing, or taxis. 2. Monitor your symptoms carefully. If your symptoms get worse, call your healthcare provider immediately. 3. Get rest and stay hydrated. 4. If you have a medical appointment, call the healthcare provider ahead of time and tell them that you have or may have COVID-19. 5. For medical emergencies, call 911 and notify the dispatch personnel that you have or may have COVID-19. 6. Cover your cough and sneezes with a tissue or use the inside of your elbow. 7. Wash your hands often with soap and water for at least 20 seconds or clean your hands with an alcohol-based hand sanitizer that contains at least 60% alcohol. 8. As much as possible, stay in a specific room and away from other people in your home. Also, you should use a separate bathroom, if available. If you need to be around other people in or outside of the home, wear a mask. 9. Avoid sharing personal items with other people in your household, like dishes, towels, and bedding. 10. Clean all surfaces that are touched often, like counters, tabletops, and doorknobs. Use household cleaning sprays or wipes according to the label instructions. cdc.gov/coronavirus 12/20/2018 This information is not intended to replace advice given to you by your health care provider. Make sure you discuss any questions you have with your health care provider. Document Revised: 05/24/2019 Document Reviewed: 05/24/2019 Elsevier Patient Education  2020 Elsevier Inc. What types of side effects do monoclonal antibody drugs cause?  Common side effects  In general, the more common side effects caused by monoclonal antibody drugs include: . Allergic reactions, such as hives or itching . Flu-like signs and  symptoms, including chills, fatigue, fever, and muscle aches and pains . Nausea, vomiting . Diarrhea . Skin rashes . Low blood pressure   The CDC is recommending patients who receive monoclonal antibody treatments wait at least 90 days before being vaccinated.  Currently, there are no data on the safety and efficacy of mRNA COVID-19 vaccines in persons who received monoclonal antibodies or convalescent plasma as part of COVID-19 treatment. Based on the estimated half-life of such therapies as well as evidence suggesting that reinfection is uncommon in the 90 days after initial infection, vaccination should be deferred for at least 90 days, as a precautionary measure until additional information becomes available, to avoid interference of the antibody treatment with vaccine-induced immune responses. If you have any questions or concerns after the infusion please call the Advanced Practice Provider on call at [phone]. This number is ONLY intended for your use regarding questions or concerns about the infusion post-treatment side-effects.  Please do not provide this number to others for use. For return to work notes please contact your primary care provider.   If someone you know is interested in receiving treatment please have them call the COVID hotline at [phone].   

## 2020-06-10 NOTE — Progress Notes (Signed)
  Diagnosis: COVID-19  Physician:Dr. Joya Gaskins  Procedure: [facility] Med: bamlanivimab\etesevimab infusion - Provided patient with bamlanimivab\etesevimab fact sheet for patients, parents and caregivers prior to infusion.  Complications: No immediate complications noted.  Discharge: Discharged home   Jessica Crosby 06/10/2020

## 2020-06-10 NOTE — Progress Notes (Signed)
Patient reviewed Fact Sheet for Patients, Parents, and Caregivers for Emergency Use Authorization (EUA) of bamlanivimab and etesevimab for the Treatment of Coronavirus. Patient also reviewed and is agreeable to the estimated cost of treatment. Patient is agreeable to proceed.   

## 2020-06-25 ENCOUNTER — Telehealth: Payer: Self-pay

## 2020-06-25 NOTE — Chronic Care Management (AMB) (Signed)
Chronic Care Management Pharmacy Assistant   Name: Jessica Crosby  MRN: [id number] DOB: April 05, 1951  Reason for Encounter: Medication Review  Patient Questions:  1.  Have you seen any other providers since your last visit? Yes, 05/21/2020 [facility] Apo (PCP)    2.  Any changes in your medicines or health? Yes, Belsomra Samples to take nightly PRN . Reviewed chart for medication changes ahead of medication coordination call.  Patient had office visit on 05/21/2020  with [facility] Apo  since last care coordination call/Pharmacist visit.    Medication changes indicated - Belsomra Samples were given to take nightly PRN. Per patient she is not taking Belsomra .  BP Readings from Last 3 Encounters:  06/10/20 (!) 146/83  05/21/20 128/76  04/08/20 134/88    Lab Results  Component Value Date   HGBA1C 5.3 09/26/2019     Patient obtains medications through [facility]  90 Days   Last adherence delivery included:  Escitalopram 10 mg- 1 tablet daily Dayvigo 5 mg- 1 tablet at bedtimes as needed Losartan 100 mg- Hydrochlorothiazide 12.5 mg- 1 tablet daily  Patient declined  Temazepam 15 mg due to no longer taking.   Patient is due for next adherence delivery on: 07/07/2020  Called patient and reviewed medications and coordinated delivery.  This delivery to include: Atorvastatin 20 mg one tablet a day Escitalopram 10 mg tablet one a day   Patient declined the following medications Aspirin 81 mg one a day OTC Calcium Carbonate 600 mg one a day OTC Cholecalciferol 2,000 mg one a day OTC Lemborexant - No longer taking - per patient  Losartan - Hydrochlorothiazide 100-12.5 one a day- has a full bottle (90 tablets) she is not for sure how she has so many  She does take every day. Magnesium 250 mg one a day OTC Multiple Vitamin one a day OTC Omega 3- Fish Oil - one a day OTC Temazepam 15 mg tablet one a day no longer taking - per patient  Patient needs refills for   Atorvastatin 20 mg tablet one tablet a day Escitalopram 10 mg tablet - one a day   Confirmed delivery date of 07/07/2020, advised patient that pharmacy will contact them the morning of delivery.    PCP : Glendale Chard, MD  Allergies:   Allergies  Allergen Reactions   Ace Inhibitors Cough    Medications: Outpatient Encounter Medications as of 06/25/2020  Medication Sig Note   aspirin 81 MG tablet Take 81 mg by mouth daily.    atorvastatin (LIPITOR) 20 MG tablet Take 1 tablet (20 mg total) by mouth daily.    calcium carbonate (OS-CAL) 600 MG TABS Take 600 mg by mouth 2 (two) times daily with a meal.    Cholecalciferol (VITAMIN D PO) Take 2,000 mg by mouth daily.     escitalopram (LEXAPRO) 10 MG tablet Take 1 tablet (10 mg total) by mouth daily.    Lemborexant (DAYVIGO) 5 MG TABS Take 5 mg by mouth at bedtime as needed.    losartan-hydrochlorothiazide (HYZAAR) 100-12.5 MG tablet Take 1 tablet by mouth daily.    Magnesium 250 MG TABS Take by mouth. 06/04/2015: Received from: [facility]   Multiple Vitamin (THERA) TABS Take 1 tablet by mouth daily. 06/09/2016: Received from: [facility]: Take 1 tablet by mouth daily.   Omega-3 Fatty Acids (FISH OIL) 1000 MG CAPS Take 1 capsule by mouth. 06/04/2015: Received from: [facility]   No facility-administered encounter medications on file as  of 06/25/2020.    Current Diagnosis: Patient Active Problem List   Diagnosis Date Noted   Night sweats 04/13/2020   Primary insomnia 04/13/2020   Situational anxiety 04/13/2020   Vitamin D deficiency disease 04/13/2020   Allergic rhinitis due to other allergen 06/10/2017   Depression 06/10/2017   Pericarditis 06/10/2017   Scarlet fever 06/10/2017   Single skin nodule 06/25/2014   Multiple thyroid nodules 01/08/2014   Essential hypertension 12/25/2013   H/O hyperparathyroidism 12/25/2013   Abdominal pain, chronic, epigastric 05/09/2012   Hypercalcemia  12/30/2008   Family history of malignant neoplasm of breast 03/30/2006   Fibrocystic breast disease 03/30/2006   Enthesopathy of hip region 08/30/2005   Esophageal reflux 12/27/2003   Mixed hyperlipidemia 02/12/2003   Osteopenia 01/26/2000    Follow-Up:  Coordination of [facility]anced Pharmacy Services   Cherylin Mylar, CPP Notified  Jon Gills, [facility] Clinical Pharmacist Assistant [phone]

## 2020-06-30 ENCOUNTER — Ambulatory Visit: Payer: PPO | Admitting: Obstetrics and Gynecology

## 2020-06-30 ENCOUNTER — Encounter: Payer: Self-pay | Admitting: Obstetrics and Gynecology

## 2020-06-30 ENCOUNTER — Other Ambulatory Visit: Payer: Self-pay

## 2020-06-30 ENCOUNTER — Ambulatory Visit: Payer: PPO | Admitting: Certified Nurse Midwife

## 2020-06-30 VITALS — BP 152/78 | HR 76 | Resp 16 | Ht 63.5 in | Wt 159.0 lb

## 2020-06-30 DIAGNOSIS — Z803 Family history of malignant neoplasm of breast: Secondary | ICD-10-CM

## 2020-06-30 DIAGNOSIS — E2839 Other primary ovarian failure: Secondary | ICD-10-CM | POA: Diagnosis not present

## 2020-06-30 DIAGNOSIS — Z01419 Encounter for gynecological examination (general) (routine) without abnormal findings: Secondary | ICD-10-CM

## 2020-06-30 DIAGNOSIS — N8111 Cystocele, midline: Secondary | ICD-10-CM | POA: Diagnosis not present

## 2020-06-30 DIAGNOSIS — Z8739 Personal history of other diseases of the musculoskeletal system and connective tissue: Secondary | ICD-10-CM | POA: Diagnosis not present

## 2020-06-30 NOTE — Patient Instructions (Signed)
EXERCISE   We recommended that you start or continue a regular exercise program for good health. Physical activity is anything that gets your body moving, some is better than none. The CDC recommends 150 minutes per week of Moderate-Intensity Aerobic Activity and 2 or more days of Muscle Strengthening Activity.  Benefits of exercise are limitless: helps weight loss/weight maintenance, improves mood and energy, helps with depression and anxiety, improves sleep, tones and strengthens muscles, improves balance, improves bone density, protects from chronic conditions such as heart disease, high blood pressure and diabetes and so much more. To learn more visit: https://www.cdc.gov/physicalactivity/index.html  DIET: Good nutrition starts with a healthy diet of fruits, vegetables, whole grains, and lean protein sources. Drink plenty of water for hydration. Minimize empty calories, sodium, sweets. For more information about dietary recommendations visit: https://health.gov/our-work/nutrition-physical-activity/dietary-guidelines and https://www.myplate.gov/  ALCOHOL:  Women should limit their alcohol intake to no more than 7 drinks/beers/glasses of wine (combined, not each!) per week. Moderation of alcohol intake to this level decreases your risk of breast cancer and liver damage.  If you are concerned that you may have a problem, or your friends have told you they are concerned about your drinking, there are many resources to help. A well-known program that is free, effective, and available to all people all over the nation is Alcoholics Anonymous.  Check out this site to learn more: https://www.aa.org/   CALCIUM AND VITAMIN D:  Adequate intake of calcium and Vitamin D are recommended for bone health.  You should be getting between 1000-1200 mg of calcium and 800 units of Vitamin D daily between diet and supplements  PAP SMEARS:  Pap smears, to check for cervical cancer or precancers,  have traditionally been  done yearly, scientific advances have shown that most women can have pap smears less often.  However, every woman still should have a physical exam from her gynecologist every year. It will include a breast check, inspection of the vulva and vagina to check for abnormal growths or skin changes, a visual exam of the cervix, and then an exam to evaluate the size and shape of the uterus and ovaries. We will also provide age appropriate advice regarding health maintenance, like when you should have certain vaccines, screening for sexually transmitted diseases, bone density testing, colonoscopy, mammograms, etc.   MAMMOGRAMS:  All women over 40 years old should have a routine mammogram.   COLON CANCER SCREENING: Now recommend starting at age 45. At this time colonoscopy is not covered for routine screening until 50. There are take home tests that can be done between 45-49.   COLONOSCOPY:  Colonoscopy to screen for colon cancer is recommended for all women at age 50.  We know, you hate the idea of the prep.  We agree, BUT, having colon cancer and not knowing it is worse!!  Colon cancer so often starts as a polyp that can be seen and removed at colonscopy, which can quite literally save your life!  And if your first colonoscopy is normal and you have no family history of colon cancer, most women don't have to have it again for 10 years.  Once every ten years, you can do something that may end up saving your life, right?  We will be happy to help you get it scheduled when you are ready.  Be sure to check your insurance coverage so you understand how much it will cost.  It may be covered as a preventative service at no cost, but you should check   your particular policy.      Breast Self-Awareness Breast self-awareness means being familiar with how your breasts look and feel. It involves checking your breasts regularly and reporting any changes to your health care provider. Practicing breast self-awareness is  important. A change in your breasts can be a sign of a serious medical problem. Being familiar with how your breasts look and feel allows you to find any problems early, when treatment is more likely to be successful. All women should practice breast self-awareness, including women who have had breast implants. How to do a breast self-exam One way to learn what is normal for your breasts and whether your breasts are changing is to do a breast self-exam. To do a breast self-exam: Look for Changes  1. Remove all the clothing above your waist. 2. Stand in front of a mirror in a room with good lighting. 3. Put your hands on your hips. 4. Push your hands firmly downward. 5. Compare your breasts in the mirror. Look for differences between them (asymmetry), such as: ? Differences in shape. ? Differences in size. ? Puckers, dips, and bumps in one breast and not the other. 6. Look at each breast for changes in your skin, such as: ? Redness. ? Scaly areas. 7. Look for changes in your nipples, such as: ? Discharge. ? Bleeding. ? Dimpling. ? Redness. ? A change in position. Feel for Changes Carefully feel your breasts for lumps and changes. It is best to do this while lying on your back on the floor and again while sitting or standing in the shower or tub with soapy water on your skin. Feel each breast in the following way:  Place the arm on the side of the breast you are examining above your head.  Feel your breast with the other hand.  Start in the nipple area and make  inch (2 cm) overlapping circles to feel your breast. Use the pads of your three middle fingers to do this. Apply light pressure, then medium pressure, then firm pressure. The light pressure will allow you to feel the tissue closest to the skin. The medium pressure will allow you to feel the tissue that is a little deeper. The firm pressure will allow you to feel the tissue close to the ribs.  Continue the overlapping circles,  moving downward over the breast until you feel your ribs below your breast.  Move one finger-width toward the center of the body. Continue to use the  inch (2 cm) overlapping circles to feel your breast as you move slowly up toward your collarbone.  Continue the up and down exam using all three pressures until you reach your armpit.  Write Down What You Find  Write down what is normal for each breast and any changes that you find. Keep a written record with breast changes or normal findings for each breast. By writing this information down, you do not need to depend only on memory for size, tenderness, or location. Write down where you are in your menstrual cycle, if you are still menstruating. If you are having trouble noticing differences in your breasts, do not get discouraged. With time you will become more familiar with the variations in your breasts and more comfortable with the exam. How often should I examine my breasts? Examine your breasts every month. If you are breastfeeding, the best time to examine your breasts is after a feeding or after using a breast pump. If you menstruate, the best time to   examine your breasts is 5-7 days after your period is over. During your period, your breasts are lumpier, and it may be more difficult to notice changes. When should I see my health care provider? See your health care provider if you notice:  A change in shape or size of your breasts or nipples.  A change in the skin of your breast or nipples, such as a reddened or scaly area.  Unusual discharge from your nipples.  A lump or thick area that was not there before.  Pain in your breasts.  Anything that concerns you. About Cystocele  Overview  The pelvic organs, including the bladder, are normally supported by pelvic floor muscles and ligaments.  When these muscles and ligaments are stretched, weakened or torn, the wall between the bladder and the vagina sags or herniates causing a  prolapse, sometimes called a cystocele.  This condition may cause discomfort and problems with emptying the bladder.  It can be present in various stages.  Some people are not aware of the changes.  Others may notice changes at the vaginal opening or a feeling of the bladder dropping outside the body.  Causes of a Cystocele  A cystocele is usually caused by muscle straining or stretching during childbirth.  In addition, cystocele is more common after menopause, because the hormone estrogen helps keep the elastic tissues around the pelvic organs strong.  A cystocele is more likely to occur when levels of estrogen decrease.  Other causes include: heavy lifting, chronic coughing, previous pelvic surgery and obesity.  Symptoms  A bladder that has dropped from its normal position may cause: unwanted urine leakage (stress incontinence), frequent urination or urge to urinate, incomplete emptying of the bladder (not feeling bladder relief after emptying), pain or discomfort in the vagina, pelvis, groin, lower back or lower abdomen and frequent urinary tract infections.  Mild cases may not cause any symptoms.  Treatment Options  Pelvic floor (Kegel) exercises:  Strength training the muscles in your genital area  Behavioral changes: Treating and preventing constipation, taking time to empty your bladder properly, learning to lift properly and/or avoid heavy lifting when possible, stopping smoking, avoiding weight gain and treating a chronic cough or bronchitis.  A pessary: A vaginal support device is sometimes used to help pelvic support caused by muscle and ligament changes.  Surgery: Surgical repair may be necessary if symptoms cannot be managed with exercise, behavioral changes and a pessary.  Surgery is usually considered for severe cases.   2007, [employer] 

## 2020-06-30 NOTE — Progress Notes (Signed)
70 y.o. G16P3003 Married White or Caucasian Not Hispanic or Latino female here for annual exam. Patient would like to discuss bladder prolapse.   She has a constant vaginal bulge, getting worse over time. Husband is bothered it, inhibits her sex life.  She is always trying to push the prolapse up. Her urinary stream is slower, most of the time she feels she empties.  Normal BM's.  No vaginal bleeding. No dyspareunia.     She is one of 5 girls and 3 boys. 2 sisters with breast cancer, 1 sister with non-hodgkins lymphoma, one brother with prostate cancer. MAunt and maternal cousin.   Patient's last menstrual period was 06/21/1998.          Sexually active: Yes.    The current method of family planning is post menopausal status.    Exercising: Yes.    trainer, yoga, and walking Smoker:  no  Health Maintenance: Pap:  06/22/18 Neg:Neg HR HPV History of abnormal Pap:  no MMG:  08/20/19 BIRADS 1 negative/density b BMD:   08/16/18 Osteopenia, FRAX 11.2/1.8% Colonoscopy: 2012 Negative TDaP:  2016 Gardasil: n/a   reports that she has never smoked. She has never used smokeless tobacco. She reports that she does not drink alcohol and does not use drugs. Retired from the school system. Watched her grandchildren x 17 years. 3 kids, 8 grandchildren (45-55 years old). Everyone in [place]. Married x 50 years.   Past Medical History:  Diagnosis Date  . Arthritis   . GERD (gastroesophageal reflux disease)   . Hx of scarlet fever    as achild  . Hypertension   . Thyroid disease    hyperparathyroidism  . Vitamin D deficiency     Past Surgical History:  Procedure Laterality Date  . CHOLECYSTECTOMY  1999  . PARATHYROIDECTOMY Right 2010    Current Outpatient Medications  Medication Sig Dispense Refill  . aspirin 81 MG tablet Take 81 mg by mouth daily.    [name] atorvastatin (LIPITOR) 20 MG tablet Take 1 tablet (20 mg total) by mouth daily. 90 tablet 0  . calcium carbonate (OS-CAL) 600 MG TABS Take 600 mg by  mouth 2 (two) times daily with a meal.    . Cholecalciferol (VITAMIN D PO) Take 2,000 mg by mouth daily.     [name] escitalopram (LEXAPRO) 10 MG tablet Take 1 tablet (10 mg total) by mouth daily. 90 tablet 1  . losartan-hydrochlorothiazide (HYZAAR) 100-12.5 MG tablet Take 1 tablet by mouth daily. 90 tablet 1  . Magnesium 250 MG TABS Take by mouth.    . Multiple Vitamin (THERA) TABS Take 1 tablet by mouth daily.    . Omega-3 Fatty Acids (FISH OIL) 1000 MG CAPS Take 1 capsule by mouth.     No current facility-administered medications for this visit.    Family History  Problem Relation Age of Onset  . Breast cancer Sister 49  . Prostate cancer Father 50  . Breast cancer Maternal Aunt 81  . Prostate cancer Paternal Uncle   . Throat cancer Paternal Grandfather        pipe smoker  . Breast cancer Sister 43  . Lymphoma Sister 58  . Leukemia Sister 55       as a result of chemotherapy from her lymphoma  . Cancer Sister 61       non hodgins lymphoma/ leukemia  . Pancreatic cancer Maternal Aunt 65  . Colon cancer Maternal Aunt        diagnosed in her  64s  . Thyroid cancer Maternal Aunt        dx in her 26s  . Leukemia Maternal Aunt        diagnosed in her 26s  . Breast cancer Cousin        diagnosed in her 46s  . Prostate cancer Paternal Uncle   Huge family.   Review of Systems  Constitutional: Negative.   HENT: Negative.   Eyes: Negative.   Respiratory: Negative.   Cardiovascular: Negative.   Gastrointestinal: Negative.   Endocrine: Negative.   Genitourinary: Negative.   Musculoskeletal: Negative.   Skin: Negative.   Allergic/Immunologic: Negative.   Neurological: Negative.   Hematological: Negative.   Psychiatric/Behavioral: Negative.     Exam:   BP (!) 152/78 (BP Location: Right Arm, Patient Position: Sitting, Cuff Size: Normal)   Pulse 76   Resp 16   Ht 5' 3.5" (1.613 m)   Wt 159 lb (72.1 kg)   LMP 06/21/1998   BMI 27.72 kg/m   Weight change: @WEIGHTCHANGE @ Height:    Height: 5' 3.5" (161.3 cm)  Ht Readings from Last 3 Encounters:  06/30/20 5' 3.5" (1.613 m)  05/21/20 5' 3.8" (1.621 m)  04/08/20 5' 3.8" (1.621 m)    General appearance: alert, cooperative and appears stated age Head: Normocephalic, without obvious abnormality, atraumatic Neck: no adenopathy, supple, symmetrical, trachea midline and thyroid normal to inspection and palpation Lungs: clear to auscultation bilaterally Cardiovascular: regular rate and rhythm Breasts: normal appearance, no masses or tenderness, left nipple inverted (per patient no change) Abdomen: soft, non-tender; non distended,  no masses,  no organomegaly Extremities: extremities normal, atraumatic, no cyanosis or edema Skin: Skin color, texture, turgor normal. No rashes or lesions Lymph nodes: Cervical, supraclavicular, and axillary nodes normal. No abnormal inguinal nodes palpated Neurologic: Grossly normal   Pelvic: External genitalia:  no lesions              Urethra:  normal appearing urethra with no masses, tenderness or lesions              Bartholins and Skenes: normal                 Vagina: mildly atrophic appearing vagina with normal color and discharge, no lesions. Grade 2-3 cystocele, comes out of the vagina ~ 2 cm with valsalva. Grade 1 uterine prolapse and rectocele (examined supine only with and without valsalva)              Cervix: no lesions               Bimanual Exam:  Uterus:  normal size, contour, position, consistency, mobility, non-tender              Adnexa: no mass, fullness, tenderness               Rectovaginal: Confirms               Anus:  normal sphincter tone, no lesions  Wandra Scot chaperoned for the exam.  1. Well woman exam with routine gynecological exam Exam only significant for prolapse.  Labs with primary  2. Midline cystocele Symptomatic grade 2-3 cystocele, mild uterine prolapse and rectocele Return for pessary fitting Discussed treatment for prolapse is pessary or  surgery, discussed the risk of recurrence with surgery Discussed risk of de nova incontinence  3. Family history of malignant neoplasm of breast -Mammogram this spring -Discussed breast self awareness - Ambulatory referral to Genetics  4. History of osteopenia  DEXA due in 2/22 - DG Bone Density; Future -discussed calcium and vit d -continue to exercise  5. Hypoestrogenism - DG Bone Density; Future  In addition to the breast and pelvic exam, over 20 minutes was spent in total patient care. Most of this was in discussing management of her cystocele and strong family history of breast cancer.

## 2020-07-02 ENCOUNTER — Telehealth: Payer: PPO

## 2020-07-08 ENCOUNTER — Other Ambulatory Visit: Payer: Self-pay | Admitting: Obstetrics and Gynecology

## 2020-07-08 DIAGNOSIS — Z1231 Encounter for screening mammogram for malignant neoplasm of breast: Secondary | ICD-10-CM

## 2020-07-16 ENCOUNTER — Other Ambulatory Visit: Payer: Self-pay

## 2020-07-16 MED ORDER — ESCITALOPRAM OXALATE 10 MG PO TABS: 10.0000 mg | ORAL_TABLET | Freq: Every day | ORAL | 1 refills | Status: DC

## 2020-07-21 ENCOUNTER — Other Ambulatory Visit: Payer: Self-pay

## 2020-07-21 ENCOUNTER — Encounter: Payer: Self-pay | Admitting: Obstetrics and Gynecology

## 2020-07-21 ENCOUNTER — Ambulatory Visit (INDEPENDENT_AMBULATORY_CARE_PROVIDER_SITE_OTHER): Payer: PPO | Admitting: Obstetrics and Gynecology

## 2020-07-21 VITALS — BP 158/80 | HR 72 | Ht 63.5 in | Wt 161.0 lb

## 2020-07-21 DIAGNOSIS — N814 Uterovaginal prolapse, unspecified: Secondary | ICD-10-CM | POA: Diagnosis not present

## 2020-07-21 DIAGNOSIS — Z4689 Encounter for fitting and adjustment of other specified devices: Secondary | ICD-10-CM

## 2020-07-21 NOTE — Progress Notes (Signed)
GYNECOLOGY  VISIT   HPI: 70 y.o.   Married White or Caucasian Not Hispanic or Latino  female   [phone] with Patient's last menstrual period was 06/21/1998.   here for a pessary fitting. She has a symptomatic grade 2-3 cystocele, grade 1 uterine prolapse and rectocele.   GYNECOLOGIC HISTORY: Patient's last menstrual period was 06/21/1998. Contraception:PMP Menopausal hormone therapy: none        OB History    Gravida  3   Para  3   Term  3   Preterm      AB      Living  3     SAB      IAB      Ectopic      Multiple      Live Births                 Patient Active Problem List   Diagnosis Date Noted  . Night sweats 04/13/2020  . Primary insomnia 04/13/2020  . Situational anxiety 04/13/2020  . Vitamin D deficiency disease 04/13/2020  . Allergic rhinitis due to other allergen 06/10/2017  . Depression 06/10/2017  . Pericarditis 06/10/2017  . Scarlet fever 06/10/2017  . Single skin nodule 06/25/2014  . Multiple thyroid nodules 01/08/2014  . Essential hypertension 12/25/2013  . H/O hyperparathyroidism 12/25/2013  . Abdominal pain, chronic, epigastric 05/09/2012  . Hypercalcemia 12/30/2008  . Family history of malignant neoplasm of breast 03/30/2006  . Fibrocystic breast disease 03/30/2006  . Enthesopathy of hip region 08/30/2005  . Esophageal reflux 12/27/2003  . Mixed hyperlipidemia 02/12/2003  . Osteopenia 01/26/2000    Past Medical History:  Diagnosis Date  . Arthritis   . GERD (gastroesophageal reflux disease)   . Hx of scarlet fever    as achild  . Hypertension   . Thyroid disease    hyperparathyroidism  . Vitamin D deficiency     Past Surgical History:  Procedure Laterality Date  . CHOLECYSTECTOMY  1999  . PARATHYROIDECTOMY Right 2010    Current Outpatient Medications  Medication Sig Dispense Refill  . aspirin 81 MG tablet Take 81 mg by mouth daily.    [name] atorvastatin (LIPITOR) 20 MG tablet Take 1 tablet (20 mg total) by mouth daily.  90 tablet 0  . calcium carbonate (OS-CAL) 600 MG TABS Take 600 mg by mouth 2 (two) times daily with a meal.    . Cholecalciferol (VITAMIN D PO) Take 2,000 mg by mouth daily.     [name] escitalopram (LEXAPRO) 10 MG tablet Take 1 tablet (10 mg total) by mouth daily. 90 tablet 1  . losartan-hydrochlorothiazide (HYZAAR) 100-12.5 MG tablet Take 1 tablet by mouth daily. 90 tablet 1  . Magnesium 250 MG TABS Take by mouth.    . Multiple Vitamin (THERA) TABS Take 1 tablet by mouth daily.    . Omega-3 Fatty Acids (FISH OIL) 1000 MG CAPS Take 1 capsule by mouth.     No current facility-administered medications for this visit.     ALLERGIES: Ace inhibitors  Family History  Problem Relation Age of Onset  . Breast cancer Sister 78  . Prostate cancer Father 35  . Breast cancer Maternal Aunt 81  . Prostate cancer Paternal Uncle   . Throat cancer Paternal Grandfather        pipe smoker  . Breast cancer Sister 56  . Lymphoma Sister 73  . Leukemia Sister 63       as a result of chemotherapy from her lymphoma  .  Cancer Sister 20       non hodgins lymphoma/ leukemia  . Pancreatic cancer Maternal Aunt 65  . Colon cancer Maternal Aunt        diagnosed in her 105s  . Thyroid cancer Maternal Aunt        dx in her 45s  . Leukemia Maternal Aunt        diagnosed in her 11s  . Breast cancer Cousin        diagnosed in her 71s  . Prostate cancer Paternal Uncle     Social History   Socioeconomic History  . Marital status: Married    Spouse name: Not on file  . Number of children: Not on file  . Years of education: Not on file  . Highest education level: Not on file  Occupational History  . Not on file  Tobacco Use  . Smoking status: Never Smoker  . Smokeless tobacco: Never Used  Vaping Use  . Vaping Use: Never used  Substance and Sexual Activity  . Alcohol use: No  . Drug use: No  . Sexual activity: Yes    Partners: Male    Birth control/protection: Post-menopausal  Other Topics Concern  .  Not on file  Social History Narrative  . Not on file   Social Determinants of Health   Financial Resource Strain: Low Risk   . Difficulty of Paying Living Expenses: Not hard at all  Food Insecurity: No Food Insecurity  . Worried About Charity fundraiser in the Last Year: Never true  . Ran Out of Food in the Last Year: Never true  Transportation Needs: No Transportation Needs  . Lack of Transportation (Medical): No  . Lack of Transportation (Non-Medical): No  Physical Activity: Sufficiently Active  . Days of Exercise per Week: 4 days  . Minutes of Exercise per Session: 60 min  Stress: No Stress Concern Present  . Feeling of Stress : Not at all  Social Connections: Not on file  Intimate Partner Violence: Not on file    Review of Systems  Constitutional: Negative.   HENT: Negative.   Eyes: Negative.   Respiratory: Negative.   Cardiovascular: Negative.   Gastrointestinal: Negative.   Genitourinary: Negative.   Musculoskeletal: Negative.   Skin: Negative.   Neurological: Negative.   Endo/Heme/Allergies: Negative.   Psychiatric/Behavioral: Negative.     PHYSICAL EXAMINATION:    BP (!) 158/80 (BP Location: Left Arm, Patient Position: Sitting, Cuff Size: Normal)   Pulse 72   Ht 5' 3.5" (1.613 m)   Wt 161 lb (73 kg)   LMP 06/21/1998   BMI 28.07 kg/m     General appearance: alert, cooperative and appears stated age  Pelvic: External genitalia:  no lesions              Urethra:  normal appearing urethra with no masses, tenderness or lesions              Bartholins and Skenes: normal                 Vagina: normal appearing vagina with normal color and discharge, no lesions. Fitted with a #4 ring pessary with support. Comfortable in the office. Stated that her urine stream with the pessary in was normal (has been slow without it)              Chaperone was present for exam.  1. Cystocele with uterine prolapse Fitted with a pessary  2. Encounter for fitting and  adjustment of pessary #4 ring pessary F/u in one week, sooner with any concerns

## 2020-07-22 ENCOUNTER — Telehealth: Payer: Self-pay

## 2020-07-22 NOTE — Progress Notes (Addendum)
    Chronic Care Management Pharmacy Assistant   Name: Jessica Crosby  MRN: [id number] DOB: September 26, 1950  Reason for Encounter: Medication Review   PCP : Glendale Chard, MD  Allergies:   Allergies  Allergen Reactions   Ace Inhibitors Cough    Medications: Outpatient Encounter Medications as of 07/22/2020  Medication Sig Note   aspirin 81 MG tablet Take 81 mg by mouth daily.    atorvastatin (LIPITOR) 20 MG tablet Take 1 tablet (20 mg total) by mouth daily.    calcium carbonate (OS-CAL) 600 MG TABS Take 600 mg by mouth 2 (two) times daily with a meal.    Cholecalciferol (VITAMIN D PO) Take 2,000 mg by mouth daily.     escitalopram (LEXAPRO) 10 MG tablet Take 1 tablet (10 mg total) by mouth daily.    losartan-hydrochlorothiazide (HYZAAR) 100-12.5 MG tablet Take 1 tablet by mouth daily.    Magnesium 250 MG TABS Take by mouth. 06/04/2015: Received from: [facility]   Multiple Vitamin (THERA) TABS Take 1 tablet by mouth daily. 06/09/2016: Received from: [facility]: Take 1 tablet by mouth daily.   Omega-3 Fatty Acids (FISH OIL) 1000 MG CAPS Take 1 capsule by mouth. 06/04/2015: Received from: [facility]   No facility-administered encounter medications on file as of 07/22/2020.    Current Diagnosis: Patient Active Problem List   Diagnosis Date Noted   Night sweats 04/13/2020   Primary insomnia 04/13/2020   Situational anxiety 04/13/2020   Vitamin D deficiency disease 04/13/2020   Allergic rhinitis due to other allergen 06/10/2017   Depression 06/10/2017   Pericarditis 06/10/2017   Scarlet fever 06/10/2017   Single skin nodule 06/25/2014   Multiple thyroid nodules 01/08/2014   Essential hypertension 12/25/2013   H/O hyperparathyroidism 12/25/2013   Abdominal pain, chronic, epigastric 05/09/2012   Hypercalcemia 12/30/2008   Family history of malignant neoplasm of breast 03/30/2006   Fibrocystic breast disease 03/30/2006   Enthesopathy of hip region  08/30/2005   Esophageal reflux 12/27/2003   Mixed hyperlipidemia 02/12/2003   Osteopenia 01/26/2000     Follow-Up:  Pharmacist Review - Patient called wanted a refill on her Lexapro 10 mg tablets. Sent message to [name] , states that she will send acute  form over to have them deliver her Lexapro tomorrow.  Called patient to let her know the status of her Lexapro. Left message for her to call me back.  Patient returned call, I told her that they will be delivering tomorrow. Patient voiced understanding.  Orlando Penner, CPP notified   Judithann Sheen, [place] Pharmacist Assistant [phone]   I have reviewed the care management and care coordination activities outlined in this encounter and I am certifying that I agree with the content of this note. No further action required.  2 minutes spent in review, coordination, and documentation.  Mayford Knife, [facility] 07/23/20 11:59 AM

## 2020-07-29 ENCOUNTER — Encounter: Payer: Self-pay | Admitting: Obstetrics and Gynecology

## 2020-07-29 ENCOUNTER — Ambulatory Visit: Payer: PPO | Admitting: Obstetrics and Gynecology

## 2020-07-29 ENCOUNTER — Other Ambulatory Visit: Payer: Self-pay

## 2020-07-29 VITALS — BP 116/68 | HR 80 | Ht 63.5 in | Wt 161.0 lb

## 2020-07-29 DIAGNOSIS — N952 Postmenopausal atrophic vaginitis: Secondary | ICD-10-CM | POA: Diagnosis not present

## 2020-07-29 DIAGNOSIS — N898 Other specified noninflammatory disorders of vagina: Secondary | ICD-10-CM | POA: Diagnosis not present

## 2020-07-29 DIAGNOSIS — Z4689 Encounter for fitting and adjustment of other specified devices: Secondary | ICD-10-CM

## 2020-07-29 DIAGNOSIS — M8580 Other specified disorders of bone density and structure, unspecified site: Secondary | ICD-10-CM | POA: Diagnosis not present

## 2020-07-29 DIAGNOSIS — T8389XA Other specified complication of genitourinary prosthetic devices, implants and grafts, initial encounter: Secondary | ICD-10-CM

## 2020-07-29 MED ORDER — ESTRADIOL 0.1 MG/GM VA CREA: TOPICAL_CREAM | VAGINAL | 1 refills | Status: DC

## 2020-07-29 NOTE — Progress Notes (Signed)
GYNECOLOGY  VISIT   HPI: 70 y.o.   Married White or Caucasian Not Hispanic or Latino  female   [phone] with Patient's last menstrual period was 06/21/1998.   here for pessary check. Patient states that she is having new incontinence but has adjusted well to the pessary.     The patient was fitted with a #4 ring pessary with support last week for a symptomatic cystocele/uterine prolapse. She didn't leak prior to the pessary. Since last week, she is leaking a little on the way to the bathroom. Leaks a little at night. Small amounts. Changing a mini pad 2 x a day. Overall she is very happy. Feels so much better. She has been sexually active with the pessary in, not uncomfortable. She would prefer to not have to take the pessary out.   GYNECOLOGIC HISTORY: Patient's last menstrual period was 06/21/1998. Contraception:PMP Menopausal hormone therapy: none        OB History    Gravida  3   Para  3   Term  3   Preterm      AB      Living  3     SAB      IAB      Ectopic      Multiple      Live Births                 Patient Active Problem List   Diagnosis Date Noted  . Night sweats 04/13/2020  . Primary insomnia 04/13/2020  . Situational anxiety 04/13/2020  . Vitamin D deficiency disease 04/13/2020  . Allergic rhinitis due to other allergen 06/10/2017  . Depression 06/10/2017  . Pericarditis 06/10/2017  . Scarlet fever 06/10/2017  . Single skin nodule 06/25/2014  . Multiple thyroid nodules 01/08/2014  . Essential hypertension 12/25/2013  . H/O hyperparathyroidism 12/25/2013  . Abdominal pain, chronic, epigastric 05/09/2012  . Hypercalcemia 12/30/2008  . Family history of malignant neoplasm of breast 03/30/2006  . Fibrocystic breast disease 03/30/2006  . Enthesopathy of hip region 08/30/2005  . Esophageal reflux 12/27/2003  . Mixed hyperlipidemia 02/12/2003  . Osteopenia 01/26/2000    Past Medical History:  Diagnosis Date  . Arthritis   . GERD  (gastroesophageal reflux disease)   . Hx of scarlet fever    as achild  . Hypertension   . Thyroid disease    hyperparathyroidism  . Vitamin D deficiency     Past Surgical History:  Procedure Laterality Date  . CHOLECYSTECTOMY  1999  . PARATHYROIDECTOMY Right 2010    Current Outpatient Medications  Medication Sig Dispense Refill  . aspirin 81 MG tablet Take 81 mg by mouth daily.    [name] atorvastatin (LIPITOR) 20 MG tablet Take 1 tablet (20 mg total) by mouth daily. 90 tablet 0  . calcium carbonate (OS-CAL) 600 MG TABS Take 600 mg by mouth 2 (two) times daily with a meal.    . Cholecalciferol (VITAMIN D PO) Take 2,000 mg by mouth daily.     [name] escitalopram (LEXAPRO) 10 MG tablet Take 1 tablet (10 mg total) by mouth daily. 90 tablet 1  . losartan-hydrochlorothiazide (HYZAAR) 100-12.5 MG tablet Take 1 tablet by mouth daily. 90 tablet 1  . Magnesium 250 MG TABS Take by mouth.    . Multiple Vitamin (THERA) TABS Take 1 tablet by mouth daily.    . Omega-3 Fatty Acids (FISH OIL) 1000 MG CAPS Take 1 capsule by mouth.     No current facility-administered medications  for this visit.     ALLERGIES: Ace inhibitors  Family History  Problem Relation Age of Onset  . Breast cancer Sister 49  . Prostate cancer Father 31  . Breast cancer Maternal Aunt 81  . Prostate cancer Paternal Uncle   . Throat cancer Paternal Grandfather        pipe smoker  . Breast cancer Sister 44  . Lymphoma Sister 50  . Leukemia Sister 39       as a result of chemotherapy from her lymphoma  . Cancer Sister 53       non hodgins lymphoma/ leukemia  . Pancreatic cancer Maternal Aunt 65  . Colon cancer Maternal Aunt        diagnosed in her 40s  . Thyroid cancer Maternal Aunt        dx in her 71s  . Leukemia Maternal Aunt        diagnosed in her 36s  . Breast cancer Cousin        diagnosed in her 38s  . Prostate cancer Paternal Uncle     Social History   Socioeconomic History  . Marital status: Married     Spouse name: Not on file  . Number of children: Not on file  . Years of education: Not on file  . Highest education level: Not on file  Occupational History  . Not on file  Tobacco Use  . Smoking status: Never Smoker  . Smokeless tobacco: Never Used  Vaping Use  . Vaping Use: Never used  Substance and Sexual Activity  . Alcohol use: No  . Drug use: No  . Sexual activity: Yes    Partners: Male    Birth control/protection: Post-menopausal  Other Topics Concern  . Not on file  Social History Narrative  . Not on file   Social Determinants of Health   Financial Resource Strain: Low Risk   . Difficulty of Paying Living Expenses: Not hard at all  Food Insecurity: No Food Insecurity  . Worried About Charity fundraiser in the Last Year: Never true  . Ran Out of Food in the Last Year: Never true  Transportation Needs: No Transportation Needs  . Lack of Transportation (Medical): No  . Lack of Transportation (Non-Medical): No  Physical Activity: Sufficiently Active  . Days of Exercise per Week: 4 days  . Minutes of Exercise per Session: 60 min  Stress: No Stress Concern Present  . Feeling of Stress : Not at all  Social Connections: Not on file  Intimate Partner Violence: Not on file    Review of Systems  Constitutional: Negative.   HENT: Negative.   Eyes: Negative.   Respiratory: Negative.   Cardiovascular: Negative.   Gastrointestinal: Negative.   Genitourinary: Negative.   Musculoskeletal: Negative.   Skin: Negative.   Neurological: Negative.   Endo/Heme/Allergies: Negative.   Psychiatric/Behavioral: Negative.     PHYSICAL EXAMINATION:    BP 116/68 (BP Location: Left Arm, Patient Position: Sitting, Cuff Size: Normal)   Pulse 80   Ht 5' 3.5" (1.613 m)   Wt 161 lb (73 kg)   LMP 06/21/1998   BMI 28.07 kg/m     General appearance: alert, cooperative and appears stated age   Pelvic: External genitalia:  no lesions              Urethra:  normal appearing urethra  with no masses, tenderness or lesions  Bartholins and Skenes: normal                 Vagina: the pessary was removed and cleaned. There is some erythema on the vaginal apex, posterior to the cervix.              Cervix: no lesions                Chaperone was present for exam.  1. Pessary maintenance Feeling great with the pessary  2. Vaginal atrophy With mild vaginal erythema from the pessary. Will start vaginal estrogen. No contraindications - estradiol (ESTRACE) 0.1 MG/GM vaginal cream; 1 gram vaginally every night x 1 week then change to twice weekly  Dispense: 42.5 g; Refill: 1  3. Osteopenia, unspecified location She would like to switch her DEXA from the breast center to here - [facility]; Future  4. Vaginal irritation from pessary (HCC)  - estradiol (ESTRACE) 0.1 MG/GM vaginal cream; 1 gram vaginally every night x 1 week then change to twice weekly  Dispense: 42.5 g; Refill: 1 -f/u in 2 weeks

## 2020-07-29 NOTE — Patient Instructions (Signed)
Atrophic Vaginitis  Atrophic vaginitis is a condition in which the tissues that line the vagina become dry and thin. This condition is most common in women who have stopped having regular menstrual periods (are in menopause). This usually starts when a woman is 74 to 70 years old. That is the time when a woman's estrogen levels begin to decrease. Estrogen is a female hormone. It helps to keep the tissues of the vagina moist. It stimulates the vagina to produce a clear fluid that lubricates the vagina for sex. This fluid also protects the vagina from infection. Lack of estrogen can cause the lining of the vagina to get thinner and dryer. The vagina may also shrink in size. It may become less elastic. Atrophic vaginitis tends to get worse over time as a woman's estrogen level drops. What are the causes? This condition is caused by the normal drop in estrogen that happens around the time of menopause. What increases the risk? Certain conditions or situations may lower a woman's estrogen level, leading to a higher risk for atrophic vaginitis. You are more likely to develop this condition if:  You are taking medicines that block estrogen.  You have had your ovaries removed.  You are being treated for cancer with radiation or medicines (chemotherapy).  You have given birth or are breastfeeding.  You are older than age 68.  You smoke. What are the signs or symptoms? Symptoms of this condition include:  Pain, soreness, a feeling of pressure, or bleeding during sex (dyspareunia).  Vaginal burning, irritation, or itching.  Pain or bleeding when a speculum is used in a vaginal exam.  Having burning pain while urinating.  Vaginal discharge. In some cases, there are no symptoms. How is this diagnosed? This condition is diagnosed based on your medical history and a physical exam. This will include a pelvic exam that checks the vaginal tissues. Though rare, you may also have other tests,  including:  A urine test.  A test that checks the acid balance in your vagina (acid balance test). How is this treated? Treatment for this condition depends on how severe your symptoms are. Treatment may include:  Using an over-the-counter vaginal lubricant before sex.  Using a long-acting vaginal moisturizer.  Using low-dose estrogen for moderate to severe symptoms that do not respond to other treatments. Options include creams, tablets, and inserts (vaginal rings). Before you use a vaginal estrogen, tell your health care provider if you have a history of: ? Breast cancer. ? Endometrial cancer. ? Blood clots. If you are not sexually active and your symptoms are very mild, you may not need treatment. Follow these instructions at home: Medicines  Take over-the-counter and prescription medicines only as told by your health care provider.  Do not use herbal or alternative medicines unless your health care provider says that you can.  Use over-the-counter creams, lubricants, or moisturizers for dryness only as told by your health care provider. General instructions  If your atrophic vaginitis is caused by menopause, discuss all of your menopause symptoms and treatment options with your health care provider.  Do not douche.  Do not use products that can make your vagina dry. These include: ? Scented feminine sprays. ? Scented tampons. ? Scented soaps.  Vaginal sex can help to improve blood flow and elasticity of vaginal tissue. If you choose to have sex and it hurts, try using a water-soluble lubricant or moisturizer right before having sex. Contact a health care provider if:  Your discharge looks  different than normal.  Your vagina has an unusual smell.  You have new symptoms.  Your symptoms do not improve with treatment.  Your symptoms get worse. Summary  Atrophic vaginitis is a condition in which the tissues that line the vagina become dry and thin. It is most common  in women who have stopped having regular menstrual periods (are in menopause).  Treatment options include using vaginal lubricants and low-dose vaginal estrogen.  Contact a health care provider if your vagina has an unusual smell, or if your symptoms get worse or do not improve after treatment. This information is not intended to replace advice given to you by your health care provider. Make sure you discuss any questions you have with your health care provider. Document Revised: 12/06/2019 Document Reviewed: 12/06/2019 Elsevier Patient Education  [place].

## 2020-07-30 ENCOUNTER — Telehealth: Payer: Self-pay | Admitting: *Deleted

## 2020-07-30 MED ORDER — ESTRADIOL 10 MCG VA TABS: ORAL_TABLET | VAGINAL | 3 refills | Status: DC

## 2020-07-30 NOTE — Telephone Encounter (Signed)
Pharmacy called stating estradiol 0.1 mg cream prescribed today is too expensive for patient. Patient would like another medication. Please advise

## 2020-07-30 NOTE — Telephone Encounter (Signed)
Patient informed with below note, she asked for estradiol 10 mcg tablet vaginal tablet Rx could be sent to pharmacy. Pt will follow up regarding cost if too expensive.

## 2020-07-30 NOTE — Telephone Encounter (Signed)
Left message for patient to call to discuss.  

## 2020-07-30 NOTE — Telephone Encounter (Signed)
Other options are premarin cream, 0.5 grams vaginally qhs x 1 week then change to 2 x a week, 30 grams, 1 refill Or Estradiol 10 mcg, 1 tablet vaginally qhs x 1 week, then change to 2 x a week. #24, 3 refills.  If all of that is too expensive we can have it compounded at the compounding pharmacy.

## 2020-08-01 ENCOUNTER — Telehealth: Payer: Self-pay

## 2020-08-01 DIAGNOSIS — H40013 Open angle with borderline findings, low risk, bilateral: Secondary | ICD-10-CM | POA: Diagnosis not present

## 2020-08-01 NOTE — Chronic Care Management (AMB) (Signed)
Chronic Care Management Pharmacy Assistant   Name: BRYCELYNN STAMPLEY  MRN: [id number] DOB: [birth year]  Reason for Encounter: Medication Review  Patient Questions:  1.  Have you seen any other providers since your last visit? Yes, 06/30/2020- Dr Talbert Nan (GYN), 07/21/2020- Dr. Talbert Nan (GYN), 07/29/2020- Dr. Talbert Nan (GYN).   2.  Any changes in your medicines or health? Yes, 06/30/2020- Lemborexant 5 mg discontinued(completed course), 07/21/2020- Losartan 100 mg discontinued (change in therapy) and Venlafaxine 37.5 mg discontinued (change in therapy), 07/29/2020- Estradiol 0.1 mg cream added, 07/30/2020- Estradiol 0.1 mg cream discontinued by provider and Estradiol 10 mcg tablets added.      PCP : Glendale Chard, MD  Allergies:   Allergies  Allergen Reactions   Ace Inhibitors Cough    Medications: Outpatient Encounter Medications as of 08/01/2020  Medication Sig Note   aspirin 81 MG tablet Take 81 mg by mouth daily.    atorvastatin (LIPITOR) 20 MG tablet Take 1 tablet (20 mg total) by mouth daily.    calcium carbonate (OS-CAL) 600 MG TABS Take 600 mg by mouth 2 (two) times daily with a meal.    Cholecalciferol (VITAMIN D PO) Take 2,000 mg by mouth daily.     escitalopram (LEXAPRO) 10 MG tablet Take 1 tablet (10 mg total) by mouth daily.    Estradiol 10 MCG TABS vaginal tablet Insert 1 tablet vaginally at bedtime for 1 week only,then insert 1 tablet vaginally twice weekly.    losartan-hydrochlorothiazide (HYZAAR) 100-12.5 MG tablet Take 1 tablet by mouth daily.    Magnesium 250 MG TABS Take by mouth. 06/04/2015: Received from: [place]   Multiple Vitamin (THERA) TABS Take 1 tablet by mouth daily. 06/09/2016: Received from: [place]: Take 1 tablet by mouth daily.   Omega-3 Fatty Acids (FISH OIL) 1000 MG CAPS Take 1 capsule by mouth. 06/04/2015: Received from: [place]   No facility-administered encounter medications on file as of 08/01/2020.     Current Diagnosis: Patient Active Problem List   Diagnosis Date Noted   Night sweats 04/13/2020   Primary insomnia 04/13/2020   Situational anxiety 04/13/2020   Vitamin D deficiency disease 04/13/2020   Allergic rhinitis due to other allergen 06/10/2017   Depression 06/10/2017   Pericarditis 06/10/2017   Scarlet fever 06/10/2017   Single skin nodule 06/25/2014   Multiple thyroid nodules 01/08/2014   Essential hypertension 12/25/2013   H/O hyperparathyroidism 12/25/2013   Abdominal pain, chronic, epigastric 05/09/2012   Hypercalcemia 12/30/2008   Family history of malignant neoplasm of breast 03/30/2006   Fibrocystic breast disease 03/30/2006   Enthesopathy of hip region 08/30/2005   Esophageal reflux 12/27/2003   Mixed hyperlipidemia 02/12/2003   Osteopenia 01/26/2000   Reviewed chart for medication changes ahead of medication coordination call.  Consults- 06/30/2020- Dr Talbert Nan (GYN), 07/21/2020- Dr. Talbert Nan (GYN), 07/29/2020- Dr. Talbert Nan (GYN) since last care coordination call/Pharmacist visit.  Medication changes indicated: 06/30/2020- Lemborexant 5 mg discontinued(completed course), 07/21/2020- Losartan 100 mg discontinued (change in therapy) and Venlafaxine 37.5 mg discontinued (change in therapy), 07/29/2020- Estradiol 0.1 mg cream added, 07/30/2020- Estradiol 0.1 mg cream discontinued by provider and Estradiol 10 mcg tablets added.    BP Readings from Last 3 Encounters:  07/29/20 116/68  07/21/20 (!) 158/80  06/30/20 (!) 152/78    Lab Results  Component Value Date   HGBA1C 5.3 09/26/2019     Patient obtains medications through Vials  90 Days   Last adherence delivery included:  Atorvastatin 20 mg one tablet  a day Escitalopram 10 mg tablet one a day   Patient declined the following medications last month: Aspirin 81 mg one a day OTC Calcium Carbonate 600 mg one a day OTC Cholecalciferol 2,000 mg one a day OTC Lemborexant - No longer taking -  per patient  Losartan - Hydrochlorothiazide 100-12.5 one a day- has a full bottle (90 tablets) she is not for sure how she has so many  She does take every day. Magnesium 250 mg one a day OTC Multiple Vitamin one a day OTC Omega 3- Fish Oil - one a day OTC Temazepam 15 mg tablet one a day no longer taking - per patient  Patient is due for next adherence delivery on: 08/05/2020. Called patient and reviewed medications and coordinated delivery.  This delivery to include:  Losartan-HCTZ 100 mg/12.5 mg- 1 tablet daily.   Short fill sent on 08/01/2020 of Estradiol Vag tablets due to the cost of Estradiol cream 0.01%.  Coordinated acute fill for Estradiol Vag tablets 10 mg to be delivered 08/01/2020.  Patient declined the following medications:  Estradiol 0.1% cream- due to price  Estradiol Vag tablet 10 mg- sent 08/01/2020  Ivermectin 3 mg- patient no longer needs  Temazepam 15 mg- patient no longer using  Escitalopram 10 mg- 1 tablet daily- due to receiving a 90-day supply on 07/22/2020  Atorvastatin 20 mg- 1 tablet daily- due to receiving a 90-day supply on 07/04/2020.   Patient needs refills for none.  Confirmed delivery date of 08/05/2020, advised patient that pharmacy will contact them the morning of delivery.  Follow-Up:  Coordination of Enhanced Pharmacy Services and Pharmacist Review  Patient states blood pressures have gone up to the 606V, 703E systolic, no diastolic pressure to give, patient is exercising more frequently now and watching what she eats, she will continue to monitor. Orlando Penner, CPP notified.  [name], [place] Pharmacist Assistant [phone]

## 2020-08-05 ENCOUNTER — Telehealth: Payer: PPO

## 2020-08-05 ENCOUNTER — Ambulatory Visit (INDEPENDENT_AMBULATORY_CARE_PROVIDER_SITE_OTHER): Payer: PPO

## 2020-08-05 DIAGNOSIS — E782 Mixed hyperlipidemia: Secondary | ICD-10-CM | POA: Diagnosis not present

## 2020-08-05 DIAGNOSIS — E213 Hyperparathyroidism, unspecified: Secondary | ICD-10-CM

## 2020-08-05 DIAGNOSIS — I10 Essential (primary) hypertension: Secondary | ICD-10-CM | POA: Diagnosis not present

## 2020-08-05 DIAGNOSIS — R232 Flushing: Secondary | ICD-10-CM

## 2020-08-07 NOTE — Chronic Care Management (AMB) (Signed)
Chronic Care Management   CCM RN Visit Note  08/05/2020 Name: Jessica Crosby MRN: [id number] DOB: 30-Apr-1951  Subjective: Jessica Crosby is a 70 y.o. year old female who is a primary care patient of Glendale Chard, MD. The care management team was consulted for assistance with disease management and care coordination needs.    Engaged with patient by telephone for follow up visit in response to provider referral for case management and/or care coordination services.   Consent to Services:  The patient was given information about Chronic Care Management services, agreed to services, and gave verbal consent prior to initiation of services.  Please see initial visit note for detailed documentation.   Patient agreed to services and verbal consent obtained.   Assessment: Review of patient past medical history, allergies, medications, health status, including review of consultants reports, laboratory and other test data, was performed as part of comprehensive evaluation and provision of chronic care management services.   SDOH (Social Determinants of Health) assessments and interventions performed:  Yes, no acute challenges identified   CCM Care Plan  Allergies  Allergen Reactions  . Ace Inhibitors Cough    Outpatient Encounter Medications as of 08/05/2020  Medication Sig Note  . aspirin 81 MG tablet Take 81 mg by mouth daily.   [name] atorvastatin (LIPITOR) 20 MG tablet Take 1 tablet (20 mg total) by mouth daily.   . calcium carbonate (OS-CAL) 600 MG TABS Take 600 mg by mouth 2 (two) times daily with a meal.   . Cholecalciferol (VITAMIN D PO) Take 2,000 mg by mouth daily.    [name] escitalopram (LEXAPRO) 10 MG tablet Take 1 tablet (10 mg total) by mouth daily.   . Estradiol 10 MCG TABS vaginal tablet Insert 1 tablet vaginally at bedtime for 1 week only,then insert 1 tablet vaginally twice weekly.   [name] losartan-hydrochlorothiazide (HYZAAR) 100-12.5 MG tablet Take 1 tablet by mouth  daily.   . Magnesium 250 MG TABS Take by mouth. 06/04/2015: Received from: [place]  . Multiple Vitamin (THERA) TABS Take 1 tablet by mouth daily. 06/09/2016: Received from: [place]: Take 1 tablet by mouth daily.  . Omega-3 Fatty Acids (FISH OIL) 1000 MG CAPS Take 1 capsule by mouth. 06/04/2015: Received from: [place]   No facility-administered encounter medications on file as of 08/05/2020.    Patient Active Problem List   Diagnosis Date Noted  . Night sweats 04/13/2020  . Primary insomnia 04/13/2020  . Situational anxiety 04/13/2020  . Vitamin D deficiency disease 04/13/2020  . Allergic rhinitis due to other allergen 06/10/2017  . Depression 06/10/2017  . Pericarditis 06/10/2017  . Scarlet fever 06/10/2017  . Single skin nodule 06/25/2014  . Multiple thyroid nodules 01/08/2014  . Essential hypertension 12/25/2013  . H/O hyperparathyroidism 12/25/2013  . Abdominal pain, chronic, epigastric 05/09/2012  . Hypercalcemia 12/30/2008  . Family history of malignant neoplasm of breast 03/30/2006  . Fibrocystic breast disease 03/30/2006  . Enthesopathy of hip region 08/30/2005  . Esophageal reflux 12/27/2003  . Mixed hyperlipidemia 02/12/2003  . Osteopenia 01/26/2000    Conditions to be addressed/monitored:Essential Hypertension, Mixed Hyperlipidemia, Hyperparathyroid, Hot Flashes  Care Plan : Obesity (Adult)  Updates made by Lynne Logan, RN since 08/07/2020 12:00 AM    Problem: Readiness for Weight Management   Priority: High    Long-Range Goal: Plan for Weight Management Developed   Start Date: 08/05/2020  Expected End Date: 02/02/2021  This Visit's Progress: On track  Priority: High  Note:   Current Barriers:  [name] Knowledge Deficits related to safe and effective weight loss . Chronic Disease Management support and education needs related to HTN, Mixed hyperlipidemia, Hyperparathyroidism Nurse Case Manager Clinical Goal(s):  [name] Over the next 180  days, patient will verbalize ongoing daily/weekly participation in a routine exercise regimen to help her achieve her weight loss goal to reach 148 lbs CCM RN CM Interventions:  08/05/20 call completed with patient  . Evaluation of current treatment plan related to weight management and patient's adherence to plan as established by provider . Provided patient with positive reinforcement for making efforts to eating healthier and increase her activity to help with weight management  . Discussed patient's current BMI and target goal for weight loss to reach 148 lbs and determined patient is using her home equipment, walking outside her home when weather permits and is visiting the [facility]; she is also participating in [employer] . Educated patient on how to get started with her weight loss including meal planning, making a grocery list and meal planning, avoid buying sweets and candy so its not readily available; substitute candy for healthy snacks . Counseled patient on importance of setting realistic goals and taking baby steps to work towards these goals to help be successful  . Discussed plans with patient for ongoing care management follow up and provided patient with direct contact information for care management team Patient Self Care Activities:  . Self administer medications as prescribed . Attend all scheduled provider appointments . Call pharmacy for medication refills . Call provider office for new concerns or questions . Adhere to dietary and exercise recommendations  . Meal prep and make grocery list prior to shopping . Avoid buying sweets and or supplement sweets with healthy snacks     Plan:Telephone follow up appointment with care management team member scheduled for:  10/13/20  Barb Merino, RN, BSN, CCM Care Management Coordinator [facility]  Direct Phone: [phone]

## 2020-08-12 ENCOUNTER — Ambulatory Visit: Payer: PPO | Admitting: Obstetrics and Gynecology

## 2020-08-20 ENCOUNTER — Ambulatory Visit: Payer: PPO | Admitting: Obstetrics and Gynecology

## 2020-08-20 ENCOUNTER — Other Ambulatory Visit: Payer: Self-pay

## 2020-08-20 ENCOUNTER — Encounter: Payer: Self-pay | Admitting: Obstetrics and Gynecology

## 2020-08-20 VITALS — BP 132/90 | HR 78 | Resp 14 | Ht 64.0 in | Wt 161.0 lb

## 2020-08-20 DIAGNOSIS — T8389XA Other specified complication of genitourinary prosthetic devices, implants and grafts, initial encounter: Secondary | ICD-10-CM | POA: Diagnosis not present

## 2020-08-20 DIAGNOSIS — Z4689 Encounter for fitting and adjustment of other specified devices: Secondary | ICD-10-CM | POA: Diagnosis not present

## 2020-08-20 DIAGNOSIS — N898 Other specified noninflammatory disorders of vagina: Secondary | ICD-10-CM

## 2020-08-20 DIAGNOSIS — N952 Postmenopausal atrophic vaginitis: Secondary | ICD-10-CM

## 2020-08-20 DIAGNOSIS — N814 Uterovaginal prolapse, unspecified: Secondary | ICD-10-CM | POA: Diagnosis not present

## 2020-08-20 NOTE — Progress Notes (Signed)
GYNECOLOGY  VISIT   HPI: 70 y.o.   Married White or Caucasian Not Hispanic or Latino  female   [phone] with Patient's last menstrual period was 06/21/1998.   here for  Pessary follow up. She was fitted with a #4 ring pessary with support at the end of January for a symptomatic cystocele and uterine prolapse. At her f/u visit she was noted to have mild vaginal erythema, so vaginal estrogen was started.  She is overall doing great. One time she felt her bladder was bulging, hasn't happened again.  She has mild incontinence since starting with the pessary. Notices dampness on a pad, doesn't feel herself leaking.  She started vaginal estrogen. No vaginal bleeding, no discharge. Having intercourse with the pessary in which is comfortable.   GYNECOLOGIC HISTORY: Patient's last menstrual period was 06/21/1998. Contraception: pmp Menopausal hormone therapy: estradiol vaginal cream        OB History    Gravida  3   Para  3   Term  3   Preterm      AB      Living  3     SAB      IAB      Ectopic      Multiple      Live Births                 Patient Active Problem List   Diagnosis Date Noted  . Night sweats 04/13/2020  . Primary insomnia 04/13/2020  . Situational anxiety 04/13/2020  . Vitamin D deficiency disease 04/13/2020  . Allergic rhinitis due to other allergen 06/10/2017  . Depression 06/10/2017  . Pericarditis 06/10/2017  . Scarlet fever 06/10/2017  . Single skin nodule 06/25/2014  . Multiple thyroid nodules 01/08/2014  . Essential hypertension 12/25/2013  . H/O hyperparathyroidism 12/25/2013  . Abdominal pain, chronic, epigastric 05/09/2012  . Hypercalcemia 12/30/2008  . Family history of malignant neoplasm of breast 03/30/2006  . Fibrocystic breast disease 03/30/2006  . Enthesopathy of hip region 08/30/2005  . Esophageal reflux 12/27/2003  . Mixed hyperlipidemia 02/12/2003  . Osteopenia 01/26/2000    Past Medical History:  Diagnosis Date  .  Arthritis   . GERD (gastroesophageal reflux disease)   . Hx of scarlet fever    as achild  . Hypertension   . Thyroid disease    hyperparathyroidism  . Vitamin D deficiency     Past Surgical History:  Procedure Laterality Date  . CHOLECYSTECTOMY  1999  . PARATHYROIDECTOMY Right 2010    Current Outpatient Medications  Medication Sig Dispense Refill  . aspirin 81 MG tablet Take 81 mg by mouth daily.    [name] atorvastatin (LIPITOR) 20 MG tablet Take 1 tablet (20 mg total) by mouth daily. 90 tablet 0  . calcium carbonate (OS-CAL) 600 MG TABS Take 600 mg by mouth 2 (two) times daily with a meal.    . Cholecalciferol (VITAMIN D PO) Take 2,000 mg by mouth daily.     [name] escitalopram (LEXAPRO) 10 MG tablet Take 1 tablet (10 mg total) by mouth daily. 90 tablet 1  . estradiol (ESTRACE) 0.1 MG/GM vaginal cream Place vaginally.    . hydrochlorothiazide (HYDRODIURIL) 12.5 MG tablet Take 12.5 mg by mouth daily.    [name] losartan (COZAAR) 100 MG tablet Take 100 mg by mouth daily.    . Magnesium 250 MG TABS Take by mouth.    . Multiple Vitamin (THERA) TABS Take 1 tablet by mouth daily.    [name]  Omega-3 Fatty Acids (FISH OIL) 1000 MG CAPS Take 1 capsule by mouth.     No current facility-administered medications for this visit.     ALLERGIES: Ace inhibitors  Family History  Problem Relation Age of Onset  . Breast cancer Sister 54  . Prostate cancer Father 79  . Breast cancer Maternal Aunt 81  . Prostate cancer Paternal Uncle   . Throat cancer Paternal Grandfather        pipe smoker  . Breast cancer Sister 62  . Lymphoma Sister 1  . Leukemia Sister 38       as a result of chemotherapy from her lymphoma  . Cancer Sister 59       non hodgins lymphoma/ leukemia  . Pancreatic cancer Maternal Aunt 65  . Colon cancer Maternal Aunt        diagnosed in her 18s  . Thyroid cancer Maternal Aunt        dx in her 81s  . Leukemia Maternal Aunt        diagnosed in her 46s  . Breast cancer Cousin         diagnosed in her 97s  . Prostate cancer Paternal Uncle     Social History   Socioeconomic History  . Marital status: Married    Spouse name: Not on file  . Number of children: Not on file  . Years of education: Not on file  . Highest education level: Not on file  Occupational History  . Not on file  Tobacco Use  . Smoking status: Never Smoker  . Smokeless tobacco: Never Used  Vaping Use  . Vaping Use: Never used  Substance and Sexual Activity  . Alcohol use: No  . Drug use: No  . Sexual activity: Yes    Partners: Male    Birth control/protection: Post-menopausal  Other Topics Concern  . Not on file  Social History Narrative  . Not on file   Social Determinants of Health   Financial Resource Strain: Low Risk   . Difficulty of Paying Living Expenses: Not hard at all  Food Insecurity: No Food Insecurity  . Worried About Charity fundraiser in the Last Year: Never true  . Ran Out of Food in the Last Year: Never true  Transportation Needs: No Transportation Needs  . Lack of Transportation (Medical): No  . Lack of Transportation (Non-Medical): No  Physical Activity: Sufficiently Active  . Days of Exercise per Week: 4 days  . Minutes of Exercise per Session: 60 min  Stress: No Stress Concern Present  . Feeling of Stress : Not at all  Social Connections: Not on file  Intimate Partner Violence: Not on file    Review of Systems  Genitourinary:       Possible bladder prolapse around pessary   All other systems reviewed and are negative.   PHYSICAL EXAMINATION:    BP 132/90 (BP Location: Right Arm, Patient Position: Sitting, Cuff Size: Normal)   Pulse 78   Resp 14   Ht 5\' 4"  (1.626 m)   Wt 161 lb (73 kg)   LMP 06/21/1998   BMI 27.64 kg/m     General appearance: alert, cooperative and appears stated age Pelvic: External genitalia:  no lesions              Urethra:  normal appearing urethra with no masses, tenderness or lesions              Bartholins and Skenes:  normal  Vagina: there was room in front of the pessary for the bladder to prolapse. The pessary was removed and cleaned. There was slight erythema on the upper, posterior left side of the vagina.   She was refitted with a #6 ring pessary with support. Comfortable with the pessary. She was given a new pessary               Cervix: no lesions                Chaperone was present for exam.  1. Encounter for fitting and adjustment of pessary The #4 ring pessary was feeling small, some prolapse around the pessary. Fitted with a #6 ring pessary with support F/U in one week  2. Cystocele with uterine prolapse Helped with the pessary  3. Vaginal atrophy Using vaginal estrogen.   4. Vaginal irritation from pessary [facility]) Suspect some of the irritation was coming from the #4 pessary being too small and rubbing.

## 2020-08-21 ENCOUNTER — Ambulatory Visit
Admission: RE | Admit: 2020-08-21 | Discharge: 2020-08-21 | Disposition: A | Discharge status: Home / Self Care | Payer: PPO | Source: Ambulatory Visit | Attending: Obstetrics and Gynecology | Admitting: Obstetrics and Gynecology

## 2020-08-21 DIAGNOSIS — Z1231 Encounter for screening mammogram for malignant neoplasm of breast: Secondary | ICD-10-CM

## 2020-08-27 ENCOUNTER — Other Ambulatory Visit: Payer: Self-pay

## 2020-08-27 ENCOUNTER — Encounter: Payer: Self-pay | Admitting: Obstetrics and Gynecology

## 2020-08-27 ENCOUNTER — Telehealth: Payer: Self-pay

## 2020-08-27 ENCOUNTER — Ambulatory Visit: Payer: PPO | Admitting: Obstetrics and Gynecology

## 2020-08-27 VITALS — BP 132/78 | HR 75 | Ht 64.0 in | Wt 161.0 lb

## 2020-08-27 DIAGNOSIS — N814 Uterovaginal prolapse, unspecified: Secondary | ICD-10-CM | POA: Diagnosis not present

## 2020-08-27 DIAGNOSIS — Z4689 Encounter for fitting and adjustment of other specified devices: Secondary | ICD-10-CM

## 2020-08-27 DIAGNOSIS — N952 Postmenopausal atrophic vaginitis: Secondary | ICD-10-CM

## 2020-08-27 NOTE — Progress Notes (Signed)
GYNECOLOGY  VISIT   HPI: 70 y.o.   Married White or Caucasian Not Hispanic or Latino  female   [phone] with Patient's last menstrual period was 06/21/1998.   here for  1 week follow up for pessary fitting. Patient states that she is doing well with the larger pessary.   She has a symptomatic cystocele and uterine prolapse, on vaginal estrogen secondary to vaginal irritation.  Fitted with a #6 ring pessary last week (change from #4 pessary).  She feels very comfortable with this pessary.  She thinks she is leaking a small amount with exercise (not bothersome).   Normal BM, voiding normally (stream is better).   GYNECOLOGIC HISTORY: Patient's last menstrual period was 06/21/1998. Contraception:none Menopausal hormone therapy: estrace         OB History    Gravida  3   Para  3   Term  3   Preterm      AB      Living  3     SAB      IAB      Ectopic      Multiple      Live Births                 Patient Active Problem List   Diagnosis Date Noted  . Night sweats 04/13/2020  . Primary insomnia 04/13/2020  . Situational anxiety 04/13/2020  . Vitamin D deficiency disease 04/13/2020  . Allergic rhinitis due to other allergen 06/10/2017  . Depression 06/10/2017  . Pericarditis 06/10/2017  . Scarlet fever 06/10/2017  . Single skin nodule 06/25/2014  . Multiple thyroid nodules 01/08/2014  . Essential hypertension 12/25/2013  . H/O hyperparathyroidism 12/25/2013  . Abdominal pain, chronic, epigastric 05/09/2012  . Hypercalcemia 12/30/2008  . Family history of malignant neoplasm of breast 03/30/2006  . Fibrocystic breast disease 03/30/2006  . Enthesopathy of hip region 08/30/2005  . Esophageal reflux 12/27/2003  . Mixed hyperlipidemia 02/12/2003  . Osteopenia 01/26/2000    Past Medical History:  Diagnosis Date  . Arthritis   . GERD (gastroesophageal reflux disease)   . Hx of scarlet fever    as achild  . Hypertension   . Thyroid disease     hyperparathyroidism  . Vitamin D deficiency     Past Surgical History:  Procedure Laterality Date  . CHOLECYSTECTOMY  1999  . PARATHYROIDECTOMY Right 2010    Current Outpatient Medications  Medication Sig Dispense Refill  . aspirin 81 MG tablet Take 81 mg by mouth daily.    [name] atorvastatin (LIPITOR) 20 MG tablet Take 1 tablet (20 mg total) by mouth daily. 90 tablet 0  . calcium carbonate (OS-CAL) 600 MG TABS Take 600 mg by mouth 2 (two) times daily with a meal.    . Cholecalciferol (VITAMIN D PO) Take 2,000 mg by mouth daily.     [name] escitalopram (LEXAPRO) 10 MG tablet Take 1 tablet (10 mg total) by mouth daily. 90 tablet 1  . estradiol (ESTRACE) 0.1 MG/GM vaginal cream Place vaginally.    . hydrochlorothiazide (HYDRODIURIL) 12.5 MG tablet Take 12.5 mg by mouth daily.    [name] losartan (COZAAR) 100 MG tablet Take 100 mg by mouth daily.    . Magnesium 250 MG TABS Take by mouth.    . Multiple Vitamin (THERA) TABS Take 1 tablet by mouth daily.    . Omega-3 Fatty Acids (FISH OIL) 1000 MG CAPS Take 1 capsule by mouth.     No current facility-administered medications  for this visit.     ALLERGIES: Ace inhibitors  Family History  Problem Relation Age of Onset  . Breast cancer Sister 52  . Prostate cancer Father 16  . Breast cancer Maternal Aunt 81  . Prostate cancer Paternal Uncle   . Throat cancer Paternal Grandfather        pipe smoker  . Breast cancer Sister 37  . Lymphoma Sister 52  . Leukemia Sister 59       as a result of chemotherapy from her lymphoma  . Cancer Sister 71       non hodgins lymphoma/ leukemia  . Pancreatic cancer Maternal Aunt 65  . Colon cancer Maternal Aunt        diagnosed in her 71s  . Thyroid cancer Maternal Aunt        dx in her 70s  . Leukemia Maternal Aunt        diagnosed in her 51s  . Breast cancer Cousin        diagnosed in her 27s  . Prostate cancer Paternal Uncle     Social History   Socioeconomic History  . Marital status: Married     Spouse name: Not on file  . Number of children: Not on file  . Years of education: Not on file  . Highest education level: Not on file  Occupational History  . Not on file  Tobacco Use  . Smoking status: Never Smoker  . Smokeless tobacco: Never Used  Vaping Use  . Vaping Use: Never used  Substance and Sexual Activity  . Alcohol use: No  . Drug use: No  . Sexual activity: Yes    Partners: Male    Birth control/protection: Post-menopausal  Other Topics Concern  . Not on file  Social History Narrative  . Not on file   Social Determinants of Health   Financial Resource Strain: Low Risk   . Difficulty of Paying Living Expenses: Not hard at all  Food Insecurity: No Food Insecurity  . Worried About Charity fundraiser in the Last Year: Never true  . Ran Out of Food in the Last Year: Never true  Transportation Needs: No Transportation Needs  . Lack of Transportation (Medical): No  . Lack of Transportation (Non-Medical): No  Physical Activity: Sufficiently Active  . Days of Exercise per Week: 4 days  . Minutes of Exercise per Session: 60 min  Stress: No Stress Concern Present  . Feeling of Stress : Not at all  Social Connections: Not on file  Intimate Partner Violence: Not on file    Review of Systems  All other systems reviewed and are negative.   PHYSICAL EXAMINATION:    LMP 06/21/1998     General appearance: alert, cooperative and appears stated age  Pelvic: External genitalia:  no lesions              Urethra:  normal appearing urethra with no masses, tenderness or lesions              Bartholins and Skenes: normal                 Vagina:the pessary was removed and cleaned. There is still minimal erythema on the upper left, posterior vaginal wall. Estrogen cream was placed on the pessary and the pessary was replaced.               Cervix: no lesions  1. Pessary maintenance Doing very well with the #6 ring pessary with support F/U in one month  2.  Cystocele with uterine prolapse Well controlled  3. Vaginal atrophy Improving with the vaginal estrogen Still with minimal vaginal erythema

## 2020-08-27 NOTE — Chronic Care Management (AMB) (Addendum)
    Chronic Care Management Pharmacy Assistant   Name: Jessica Crosby  MRN: [id number] DOB: 02-09-1951  Reason for Encounter: Medication Review   Recent office visits:  08/05/2020- [name] (CCM)  Recent consult visits:  08/20/2020- Dr Talbert Nan (GYN), 08/27/2020- Dr Talbert Nan (GYN)  Hospital visits:  None in previous 6 months   Reviewed chart for medication changes ahead of medication coordination call.  Medication changes indicated: 08/20/2020- Estradiol 10 mcg changed to 0.1 mg/gm cream.  BP Readings from Last 3 Encounters:  08/27/20 132/78  08/20/20 132/90  07/29/20 116/68    Lab Results  Component Value Date   HGBA1C 5.3 09/26/2019     Patient obtains medications through Vials  90 Days   Last adherence delivery included:  Atorvastatin 20 mg one tablet a day Escitalopram 10 mg tablet one a day   Patient declined the following medications last month:  Aspirin 81 mg one a day OTC Calcium Carbonate 600 mg one a day OTC Cholecalciferol 2,000 mg one a day OTC Lemborexant - No longer taking - per patient  Losartan - Hydrochlorothiazide 100-12.5 one a day- has a full bottle (90 tablets) she is not for sure how she has so many  She does take every day. Magnesium 250 mg one a day OTC Multiple Vitamin one a day OTC Omega 3- Fish Oil - one a day OTC Temazepam 15 mg tablet one a day no longer taking - per patient  Patient is due for next adherence delivery on: 09/01/2020. 08/28/2020- Called patient and reviewed medications no answer, left message to return call.  No return call, follow up call with patient due 09/24/2020. Will review medications and coordinate delivery.   Medications: Outpatient Encounter Medications as of 08/27/2020  Medication Sig Note   aspirin 81 MG tablet Take 81 mg by mouth daily.    atorvastatin (LIPITOR) 20 MG tablet Take 1 tablet (20 mg total) by mouth daily.    calcium carbonate (OS-CAL) 600 MG TABS Take 600 mg by mouth 2 (two) times daily with a  meal.    Cholecalciferol (VITAMIN D PO) Take 2,000 mg by mouth daily.     escitalopram (LEXAPRO) 10 MG tablet Take 1 tablet (10 mg total) by mouth daily.    estradiol (ESTRACE) 0.1 MG/GM vaginal cream Place vaginally.    hydrochlorothiazide (HYDRODIURIL) 12.5 MG tablet Take 12.5 mg by mouth daily.    losartan (COZAAR) 100 MG tablet Take 100 mg by mouth daily.    Magnesium 250 MG TABS Take by mouth. 06/04/2015: Received from: [facility]   Multiple Vitamin (THERA) TABS Take 1 tablet by mouth daily. 06/09/2016: Received from: [facility]: Take 1 tablet by mouth daily.   Omega-3 Fatty Acids (FISH OIL) 1000 MG CAPS Take 1 capsule by mouth. 06/04/2015: Received from: [facility]   No facility-administered encounter medications on file as of 08/27/2020.     Star Rating Drugs: Atorvastatin 20 mg- Last filled 07/04/2020 for 90 day supply at [facility]. Losartan 100 mg- Last filled 08/05/2020 for 90 day supply at [facility].  Orlando Penner, CPP notified, contacting patient 09/24/2020.  DUK:GURKYH Melvin, [place] Pharmacist Assistant [phone]  I have reviewed the care management and care coordination activities outlined in this encounter and I am certifying that I agree with the content of this note. No further action required.  Mayford Knife, [facility] 10/03/20 1:12 PM

## 2020-09-11 ENCOUNTER — Telehealth: Payer: Self-pay | Admitting: *Deleted

## 2020-09-11 NOTE — Chronic Care Management (AMB) (Signed)
  Care Management   Note  09/11/2020 Name: MARQUITTA PERSICHETTI MRN: [id number] DOB: Apr 13, 1951  CLARIBEL SACHS is a 70 y.o. year old female who is a primary care patient of Glendale Chard, MD and is actively engaged with the care management team. I reached out to Iran Sizer by phone today to assist with re-scheduling a follow up visit with the RN Case Manager.  Follow up plan: Unsuccessful telephone outreach attempt made. A HIPAA compliant phone message was left for the patient providing contact information and requesting a return call.  The care management team will reach out to the patient again over the next 7 days.  If patient returns call to provider office, please advise to call [place] at [phone].  [place] Management

## 2020-09-11 NOTE — Telephone Encounter (Signed)
Call placed to patient.  Left message advising pessary delivered to office today, please return call to schedule OV for fitting.    Routing to [employer] triage FYI.

## 2020-09-12 NOTE — Telephone Encounter (Signed)
Spoke with patient. Pessary placed on 08/27/20, does not need an additional appt at this time. Will see Dr. Talbert Nan for f/u on 4/27.   Encounter closed.

## 2020-09-17 NOTE — Chronic Care Management (AMB) (Signed)
  Care Management   Note  09/17/2020 Name: Jessica Crosby MRN: [id number] DOB: 09-Jun-1951  Jessica Crosby is a 70 y.o. year old female who is a primary care patient of Glendale Chard, MD and is actively engaged with the care management team. I reached out to Iran Sizer by phone today to assist with re-scheduling a follow up visit with the RN Case Manager  Follow up plan: Telephone appointment with care management team member scheduled for:10/22/2020  [place] Management

## 2020-09-25 ENCOUNTER — Telehealth: Payer: Self-pay

## 2020-09-25 NOTE — Chronic Care Management (AMB) (Signed)
Chronic Care Management Pharmacy Assistant   Name: Jessica Crosby  MRN: [id number] DOB: Jun 15, 1951   Reason for Encounter: Medication Review/ Medication coordination call    Recent office visits: None  Recent consult visits: None  Hospital visits:  None in previous 6 months   Reviewed chart for medication changes ahead of medication coordination call.   No OVs, Consults, or hospital visits since last care coordination call/Pharmacist visit.  No medication changes indicated   BP Readings from Last 3 Encounters:  08/27/20 132/78  08/20/20 132/90  07/29/20 116/68    Lab Results  Component Value Date   HGBA1C 5.3 09/26/2019     Patient obtains medications through Vials  90 Days   Last adherence delivery included: Unable to contact patient last month.  Patient did not receive the following medications last month: Aspirin 81 mg one a day OTC Calcium Carbonate 600 mg one a day OTC Cholecalciferol 2,000 mg one a day OTC Lemborexant - No longer taking - per patient  Losartan - Hydrochlorothiazide 100-12.5 one a day- has a full bottle (90 tablets) she is not for sure how she has so many She does take every day. Magnesium 250 mg one a day OTC Multiple Vitamin one a day OTC Omega 3- Fish Oil - one a day OTC Temazepam 15 mg tablet one a day no longer taking - per patien  Patient is due for next adherence delivery on: 10-01-2020 Called patient and reviewed medications and coordinated delivery.  This delivery to include: None  No short fill or acute fill needed   Patient declined the following medications : Losartan 100mg - one tablet daily Hydrochlorothiazide 12.5mg - one tablet daily   Due to receiving 90DS on 08-05-20 Estradiol 0.01% cream- Apply one gram vaginally twice weekly              Due to receiving a 131 DS on 08-01-2020 Escitalopram 10mg - one tablet daily              Due to receiving a 90DS on 07-22-2020 Atorvastatin 20mg - one tablet daily  (patient reports she's taking Monday, Wednesday and Friday)              Due to receiving a 90DS on 07-04-2020 (patient reports having a quantity of 30 on hand. Will follow-up quantity on next adherence call)   Aspirin 81 mg one a day OTC Calcium Carbonate 600 mg one a day OTC Cholecalciferol 2,000 mg one a day OTC Lemborexant - No longer taking - per patient  Magnesium 250 mg one a day OTC Multiple Vitamin one a day OTC Omega 3- Fish Oil - one a day OTC Temazepam 15 mg tablet one a day no longer taking - per patient   Patient needs refills for none  Patient aware of follow-up next month to review medications and coordinate delivery.  Medications: Outpatient Encounter Medications as of 09/25/2020  Medication Sig Note  . aspirin 81 MG tablet Take 81 mg by mouth daily.   [name] atorvastatin (LIPITOR) 20 MG tablet Take 1 tablet (20 mg total) by mouth daily.   . calcium carbonate (OS-CAL) 600 MG TABS Take 600 mg by mouth 2 (two) times daily with a meal.   . Cholecalciferol (VITAMIN D PO) Take 2,000 mg by mouth daily.    [name] escitalopram (LEXAPRO) 10 MG tablet Take 1 tablet (10 mg total) by mouth daily.   [name] estradiol (ESTRACE) 0.1 MG/GM vaginal cream Place vaginally.   . hydrochlorothiazide (HYDRODIURIL) 12.5  MG tablet Take 12.5 mg by mouth daily.   [name] losartan (COZAAR) 100 MG tablet Take 100 mg by mouth daily.   . Magnesium 250 MG TABS Take by mouth. 06/04/2015: Received from: [facility]  . Multiple Vitamin (THERA) TABS Take 1 tablet by mouth daily. 06/09/2016: Received from: [facility]: Take 1 tablet by mouth daily.  . Omega-3 Fatty Acids (FISH OIL) 1000 MG CAPS Take 1 capsule by mouth. 06/04/2015: Received from: [facility]   No facility-administered encounter medications on file as of 09/25/2020.    Star Rating Drugs: Atorvastatin 20mg  last filled 07-04-2020 90DS at [facility] Losartan 100mg  last filled 08-05-2020 90DS at [facility]  Notes: Patient checks blood pressures at  home, recent readings 129/70, 134/80. Patient continues to exercise and watch her weight, she has an appointment with PCP- Dr Baird Cancer on 10/01/2020.  SIG: Jessica Crosby, [place] Clinical Pharmacist Assistant [phone] [place] Pharmacist Assistant

## 2020-10-01 ENCOUNTER — Ambulatory Visit (INDEPENDENT_AMBULATORY_CARE_PROVIDER_SITE_OTHER): Payer: PPO

## 2020-10-01 ENCOUNTER — Ambulatory Visit (INDEPENDENT_AMBULATORY_CARE_PROVIDER_SITE_OTHER): Payer: PPO | Admitting: Internal Medicine

## 2020-10-01 ENCOUNTER — Other Ambulatory Visit: Payer: Self-pay

## 2020-10-01 ENCOUNTER — Encounter: Payer: Self-pay | Admitting: Internal Medicine

## 2020-10-01 ENCOUNTER — Other Ambulatory Visit: Payer: PPO

## 2020-10-01 VITALS — BP 132/70 | HR 73 | Temp 98.0°F | Ht 63.4 in | Wt 162.9 lb

## 2020-10-01 VITALS — BP 150/82 | HR 73 | Temp 98.0°F | Ht 63.4 in | Wt 162.6 lb

## 2020-10-01 DIAGNOSIS — Z6828 Body mass index (BMI) 28.0-28.9, adult: Secondary | ICD-10-CM

## 2020-10-01 DIAGNOSIS — I10 Essential (primary) hypertension: Secondary | ICD-10-CM

## 2020-10-01 DIAGNOSIS — N951 Menopausal and female climacteric states: Secondary | ICD-10-CM | POA: Diagnosis not present

## 2020-10-01 DIAGNOSIS — Z0000 Encounter for general adult medical examination without abnormal findings: Secondary | ICD-10-CM

## 2020-10-01 DIAGNOSIS — E663 Overweight: Secondary | ICD-10-CM | POA: Diagnosis not present

## 2020-10-01 NOTE — Progress Notes (Signed)
This visit occurred during the SARS-CoV-2 public health emergency.  Safety protocols were in place, including screening questions prior to the visit, additional usage of staff PPE, and extensive cleaning of exam room while observing appropriate contact time as indicated for disinfecting solutions.  Subjective:   ALANEY WITTER is a 70 y.o. female who presents for Medicare Annual (Subsequent) preventive examination.  Review of Systems     Cardiac Risk Factors include: advanced age (>22men, >27 women);dyslipidemia;hypertension     Objective:    Today's Vitals   10/01/20 1051  BP: (!) 150/82  Pulse: 73  Temp: 98 F (36.7 C)  TempSrc: Oral  SpO2: 96%  Weight: 162 lb 9.6 oz (73.8 kg)  Height: 5' 3.4" (1.61 m)   Body mass index is 28.44 kg/m.  Advanced Directives 10/01/2020 09/26/2019 04/17/2019 03/22/2019  Does Patient Have a Medical Advance Directive? No No Yes No  Type of Advance Directive - Public librarian -  Does patient want to make changes to medical advance directive? - - No - Patient declined -  Copy of [place] in Chart? - - No - copy requested -  Would patient like information on creating a medical advance directive? No - Patient declined No - Patient declined - -    Current Medications (verified) Outpatient Encounter Medications as of 10/01/2020  Medication Sig  . aspirin 81 MG tablet Take 81 mg by mouth daily.  [name] atorvastatin (LIPITOR) 20 MG tablet Take 1 tablet (20 mg total) by mouth daily.  . calcium carbonate (OS-CAL) 600 MG TABS Take 600 mg by mouth 2 (two) times daily with a meal.  . Cholecalciferol (VITAMIN D PO) Take 2,000 mg by mouth daily.   [name] escitalopram (LEXAPRO) 10 MG tablet Take 1 tablet (10 mg total) by mouth daily.  [name] estradiol (ESTRACE) 0.1 MG/GM vaginal cream Place vaginally.  . hydrochlorothiazide (HYDRODIURIL) 12.5 MG tablet Take 12.5 mg by mouth daily.  [name] losartan (COZAAR) 100 MG tablet Take 100 mg by  mouth daily.  . Magnesium 250 MG TABS Take by mouth.  . Multiple Vitamin (THERA) TABS Take 1 tablet by mouth daily.  . Omega-3 Fatty Acids (FISH OIL) 1000 MG CAPS Take 1 capsule by mouth.   No facility-administered encounter medications on file as of 10/01/2020.    Allergies (verified) Ace inhibitors   History: Past Medical History:  Diagnosis Date  . Arthritis   . GERD (gastroesophageal reflux disease)   . Hx of scarlet fever    as achild  . Hypertension   . Thyroid disease    hyperparathyroidism  . Vitamin D deficiency    Past Surgical History:  Procedure Laterality Date  . CHOLECYSTECTOMY  1999  . PARATHYROIDECTOMY Right 2010   Family History  Problem Relation Age of Onset  . Breast cancer Sister 89  . Prostate cancer Father 76  . Breast cancer Maternal Aunt 81  . Prostate cancer Paternal Uncle   . Throat cancer Paternal Grandfather        pipe smoker  . Breast cancer Sister 62  . Lymphoma Sister 86  . Leukemia Sister 51       as a result of chemotherapy from her lymphoma  . Cancer Sister 69       non hodgins lymphoma/ leukemia  . Pancreatic cancer Maternal Aunt 65  . Colon cancer Maternal Aunt        diagnosed in her 56s  . Thyroid cancer Maternal Aunt  dx in her 12s  . Leukemia Maternal Aunt        diagnosed in her 13s  . Breast cancer Cousin        diagnosed in her 69s  . Prostate cancer Paternal Uncle    Social History   Socioeconomic History  . Marital status: Married    Spouse name: Not on file  . Number of children: Not on file  . Years of education: Not on file  . Highest education level: Not on file  Occupational History  . Not on file  Tobacco Use  . Smoking status: Never Smoker  . Smokeless tobacco: Never Used  Vaping Use  . Vaping Use: Never used  Substance and Sexual Activity  . Alcohol use: No  . Drug use: No  . Sexual activity: Yes    Partners: Male    Birth control/protection: Post-menopausal  Other Topics Concern  .  Not on file  Social History Narrative  . Not on file   Social Determinants of Health   Financial Resource Strain: Low Risk   . Difficulty of Paying Living Expenses: Not hard at all  Food Insecurity: No Food Insecurity  . Worried About Charity fundraiser in the Last Year: Never true  . Ran Out of Food in the Last Year: Never true  Transportation Needs: No Transportation Needs  . Lack of Transportation (Medical): No  . Lack of Transportation (Non-Medical): No  Physical Activity: Sufficiently Active  . Days of Exercise per Week: 5 days  . Minutes of Exercise per Session: 90 min  Stress: No Stress Concern Present  . Feeling of Stress : Not at all  Social Connections: Not on file    Tobacco Counseling Counseling given: Not Answered   Clinical Intake:  Pre-visit preparation completed: Yes  Pain : No/denies pain     Nutritional Status: BMI 25 -29 Overweight Nutritional Risks: None Diabetes: No  How often do you need to have someone help you when you read instructions, pamphlets, or other written materials from your doctor or pharmacy?: 1 - Never What is the last grade level you completed in school?: 95yr college  Diabetic? no  Interpreter Needed?: No  Information entered by :: NAllen LPN   Activities of Daily Living In your present state of health, do you have any difficulty performing the following activities: 10/01/2020  Hearing? N  Vision? N  Difficulty concentrating or making decisions? N  Walking or climbing stairs? N  Dressing or bathing? N  Doing errands, shopping? N  Preparing Food and eating ? N  Using the Toilet? N  In the past six months, have you accidently leaked urine? Y  Do you have problems with loss of bowel control? N  Managing your Medications? N  Managing your Finances? N  Housekeeping or managing your Housekeeping? N  Some recent data might be hidden    Patient Care Team: Glendale Chard, MD as PCP - General (Internal Medicine) Rex Kras,  Claudette Stapler, RN  Indicate any recent Medical Services you may have received from other than [facility] providers in the past year (date may be approximate).     Assessment:   This is a routine wellness examination for [place].  Hearing/Vision screen No exam data present  Dietary issues and exercise activities discussed: Current Exercise Habits: Home exercise routine, Type of exercise: walking, Time (Minutes): > 60, Frequency (Times/Week): 5, Weekly Exercise (Minutes/Week): 0  Goals    . Manage Obesity     Timeframe:  Long-Range Goal Priority:  High Start Date:  08/05/20                           Expected End Date:  02/02/21  Follow Up Date: 10/13/20  Patient Goals/Self Care Activities:  . Self administer medications as prescribed . Attend all scheduled provider appointments . Call pharmacy for medication refills . Call provider office for new concerns or questions . Adhere to dietary and exercise recommendations  . Meal prep and make grocery list prior to shopping . Avoid buying sweets and or supplement sweets with healthy snacks                         . Patient Stated     10/01/2020, wants to get stronger    . Weight (lb) < 200 lb (90.7 kg)     03/22/2019, wants to weigh 140 pounds    . Weight (lb) < 200 lb (90.7 kg)     09/26/2019, wants to get to 140 pounds, by stop eating cookies      Depression Screen PHQ 2/9 Scores 10/01/2020 05/21/2020 09/26/2019 09/26/2019 03/28/2019 03/22/2019 03/01/2019  PHQ - 2 Score 0 0 0 0 0 0 0  PHQ- 9 Score - 5 - - - - -    Fall Risk Fall Risk  10/01/2020 09/26/2019 09/26/2019 03/28/2019 03/22/2019  Falls in the past year? 0 0 0 0 0  Number falls in past yr: - - 0 - 0  Injury with Fall? - - 0 - -  Risk for fall due to : Medication side effect Medication side effect - - Medication side effect  Follow up Falls evaluation completed;Education provided;Falls prevention discussed Falls evaluation completed;Education provided;Falls prevention discussed - - Falls  evaluation completed;Falls prevention discussed    FALL RISK PREVENTION PERTAINING TO THE HOME:  Any stairs in or around the home? No  If so, are there any without handrails? n/a Home free of loose throw rugs in walkways, pet beds, electrical cords, etc? Yes  Adequate lighting in your home to reduce risk of falls? Yes   ASSISTIVE DEVICES UTILIZED TO PREVENT FALLS:  Life alert? No  Use of a cane, walker or w/c? No  Grab bars in the bathroom? No  Shower chair or bench in shower? Yes  Elevated toilet seat or a handicapped toilet? Yes   TIMED UP AND GO:  Was the test performed? No .   Gait steady and fast without use of assistive device  Cognitive Function:     6CIT Screen 10/01/2020 09/26/2019 03/22/2019  What Year? 0 points 0 points 0 points  What month? 0 points 0 points 0 points  What time? 0 points 0 points 0 points  Count back from 20 0 points 0 points 0 points  Months in reverse 0 points 0 points 0 points  Repeat phrase 2 points 0 points 0 points  Total Score 2 0 0    Immunizations Immunization History  Administered Date(s) Administered  . DT (Pediatric) 02/12/2003  . Fluad Quad(high Dose 65+) 04/08/2020  . Influenza, High Dose Seasonal PF 03/13/2018, 03/01/2019  . Influenza, Seasonal, Injecte, Preservative Fre 04/29/2001, 05/11/2005, 03/30/2006, 05/12/2007, 04/01/2015  . Influenza-Unspecified 03/26/2017  . PFIZER(Purple Top)SARS-COV-2 Vaccination 07/13/2019, 08/03/2019  . Pneumococcal Conjugate-13 05/26/2016  . Pneumococcal Polysaccharide-23 03/28/2019  . Tdap 03/04/2015  . Zoster 06/22/2010    TDAP status: Up to date  Flu Vaccine status: Up to date  Pneumococcal vaccine status: Up to date  Covid-19 vaccine status: Completed vaccines  Qualifies for Shingles Vaccine? Yes   Zostavax completed Yes   Shingrix Completed?: No.    Education has been provided regarding the importance of this vaccine. Patient has been advised to call insurance company to determine  out of pocket expense if they have not yet received this vaccine. Advised may also receive vaccine at local pharmacy or Health Dept. Verbalized acceptance and understanding.  Screening Tests Health Maintenance  Topic Date Due  . COVID-19 Vaccine (3 - Booster for Pfizer series) 01/31/2020  . COLONOSCOPY (Pts 45-4yrs Insurance coverage will need to be confirmed)  12/17/2020  . INFLUENZA VACCINE  01/19/2021  . MAMMOGRAM  08/22/2022  . TETANUS/TDAP  03/03/2025  . DEXA SCAN  Completed  . Hepatitis C Screening  Completed  . PNA vac Low Risk Adult  Completed  . HPV VACCINES  Aged Out    Health Maintenance  Health Maintenance Due  Topic Date Due  . COVID-19 Vaccine (3 - Booster for Pfizer series) 01/31/2020    Colorectal cancer screening: Type of screening: Colonoscopy. Completed 12/18/2010. Repeat every 10 years  Mammogram status: Completed 08/21/2020. Repeat every year  Bone Density status: scheduled 03/12/2021  Lung Cancer Screening: (Low Dose CT Chest recommended if Age 22-80 years, 30 pack-year currently smoking OR have quit w/in 15years.) does not qualify.   Lung Cancer Screening Referral: no  Additional Screening:  Hepatitis C Screening: does qualify; Completed 10//12/2018  Vision Screening: Recommended annual ophthalmology exams for early detection of glaucoma and other disorders of the eye. Is the patient up to date with their annual eye exam?  Yes  Who is the provider or what is the name of the office in which the patient attends annual eye exams? Dr. Idolina Primer If pt is not established with a provider, would they like to be referred to a provider to establish care? No .   Dental Screening: Recommended annual dental exams for proper oral hygiene  Community Resource Referral / Chronic Care Management: CRR required this visit?  No   CCM required this visit?  No      Plan:     I have personally reviewed and noted the following in the patient's chart:   . Medical and  social history . Use of alcohol, tobacco or illicit drugs  . Current medications and supplements . Functional ability and status . Nutritional status . Physical activity . Advanced directives . List of other physicians . Hospitalizations, surgeries, and ER visits in previous 12 months . Vitals . Screenings to include cognitive, depression, and falls . Referrals and appointments  In addition, I have reviewed and discussed with patient certain preventive protocols, quality metrics, and best practice recommendations. A written personalized care plan for preventive services as well as general preventive health recommendations were provided to patient.     Kellie Simmering, LPN   12/02/4313   Nurse Notes:

## 2020-10-01 NOTE — Patient Instructions (Signed)
Jessica Crosby , Thank you for taking time to come for your Medicare Wellness Visit. I appreciate your ongoing commitment to your health goals. Please review the following plan we discussed and let me know if I can assist you in the future.   Screening recommendations/referrals: Colonoscopy: completed 12/18/2010 Mammogram: completed 08/21/2020 Bone Density: scheduled 03/12/2021 Recommended yearly ophthalmology/optometry visit for glaucoma screening and checkup Recommended yearly dental visit for hygiene and checkup  Vaccinations: Influenza vaccine: completed 04/08/2020, due 01/19/2021 Pneumococcal vaccine: completed 03/28/2019 Tdap vaccine: completed 03/04/2015, due 03/03/2025 Shingles vaccine: discussed   Covid-19: 08/03/2019, 07/13/2019  Advanced directives: Advance directive discussed with you today. Even though you declined this today please call our office should you change your mind and we can give you the proper paperwork for you to fill out.  Conditions/risks identified: none  Next appointment: Follow up in one year for your annual wellness visit    Preventive Care 65 Years and Older, Female Preventive care refers to lifestyle choices and visits with your health care provider that can promote health and wellness. What does preventive care include?  A yearly physical exam. This is also called an annual well check.  Dental exams once or twice a year.  Routine eye exams. Ask your health care provider how often you should have your eyes checked.  Personal lifestyle choices, including:  Daily care of your teeth and gums.  Regular physical activity.  Eating a healthy diet.  Avoiding tobacco and drug use.  Limiting alcohol use.  Practicing safe sex.  Taking low-dose aspirin every day.  Taking vitamin and mineral supplements as recommended by your health care provider. What happens during an annual well check? The services and screenings done by your health care provider  during your annual well check will depend on your age, overall health, lifestyle risk factors, and family history of disease. Counseling  Your health care provider may ask you questions about your:  Alcohol use.  Tobacco use.  Drug use.  Emotional well-being.  Home and relationship well-being.  Sexual activity.  Eating habits.  History of falls.  Memory and ability to understand (cognition).  Work and work Statistician.  Reproductive health. Screening  You may have the following tests or measurements:  Height, weight, and BMI.  Blood pressure.  Lipid and cholesterol levels. These may be checked every 5 years, or more frequently if you are over 11 years old.  Skin check.  Lung cancer screening. You may have this screening every year starting at age 59 if you have a 30-pack-year history of smoking and currently smoke or have quit within the past 15 years.  Fecal occult blood test (FOBT) of the stool. You may have this test every year starting at age 45.  Flexible sigmoidoscopy or colonoscopy. You may have a sigmoidoscopy every 5 years or a colonoscopy every 10 years starting at age 35.  Hepatitis C blood test.  Hepatitis B blood test.  Sexually transmitted disease (STD) testing.  Diabetes screening. This is done by checking your blood sugar (glucose) after you have not eaten for a while (fasting). You may have this done every 1-3 years.  Bone density scan. This is done to screen for osteoporosis. You may have this done starting at age 47.  Mammogram. This may be done every 1-2 years. Talk to your health care provider about how often you should have regular mammograms. Talk with your health care provider about your test results, treatment options, and if necessary, the need for more  tests. Vaccines  Your health care provider may recommend certain vaccines, such as:  Influenza vaccine. This is recommended every year.  Tetanus, diphtheria, and acellular pertussis  (Tdap, Td) vaccine. You may need a Td booster every 10 years.  Zoster vaccine. You may need this after age 53.  Pneumococcal 13-valent conjugate (PCV13) vaccine. One dose is recommended after age 31.  Pneumococcal polysaccharide (PPSV23) vaccine. One dose is recommended after age 59. Talk to your health care provider about which screenings and vaccines you need and how often you need them. This information is not intended to replace advice given to you by your health care provider. Make sure you discuss any questions you have with your health care provider. Document Released: 07/04/2015 Document Revised: 02/25/2016 Document Reviewed: 04/08/2015 Elsevier Interactive Patient Education  2017 [place] Prevention in the Home Falls can cause injuries. They can happen to people of all ages. There are many things you can do to make your home safe and to help prevent falls. What can I do on the outside of my home?  Regularly fix the edges of walkways and driveways and fix any cracks.  Remove anything that might make you trip as you walk through a door, such as a raised step or threshold.  Trim any bushes or trees on the path to your home.  Use bright outdoor lighting.  Clear any walking paths of anything that might make someone trip, such as rocks or tools.  Regularly check to see if handrails are loose or broken. Make sure that both sides of any steps have handrails.  Any raised decks and porches should have guardrails on the edges.  Have any leaves, snow, or ice cleared regularly.  Use sand or salt on walking paths during winter.  Clean up any spills in your garage right away. This includes oil or grease spills. What can I do in the bathroom?  Use night lights.  Install grab bars by the toilet and in the tub and shower. Do not use towel bars as grab bars.  Use non-skid mats or decals in the tub or shower.  If you need to sit down in the shower, use a plastic, non-slip  stool.  Keep the floor dry. Clean up any water that spills on the floor as soon as it happens.  Remove soap buildup in the tub or shower regularly.  Attach bath mats securely with double-sided non-slip rug tape.  Do not have throw rugs and other things on the floor that can make you trip. What can I do in the bedroom?  Use night lights.  Make sure that you have a light by your bed that is easy to reach.  Do not use any sheets or blankets that are too big for your bed. They should not hang down onto the floor.  Have a firm chair that has side arms. You can use this for support while you get dressed.  Do not have throw rugs and other things on the floor that can make you trip. What can I do in the kitchen?  Clean up any spills right away.  Avoid walking on wet floors.  Keep items that you use a lot in easy-to-reach places.  If you need to reach something above you, use a strong step stool that has a grab bar.  Keep electrical cords out of the way.  Do not use floor polish or wax that makes floors slippery. If you must use wax, use non-skid floor  wax.  Do not have throw rugs and other things on the floor that can make you trip. What can I do with my stairs?  Do not leave any items on the stairs.  Make sure that there are handrails on both sides of the stairs and use them. Fix handrails that are broken or loose. Make sure that handrails are as long as the stairways.  Check any carpeting to make sure that it is firmly attached to the stairs. Fix any carpet that is loose or worn.  Avoid having throw rugs at the top or bottom of the stairs. If you do have throw rugs, attach them to the floor with carpet tape.  Make sure that you have a light switch at the top of the stairs and the bottom of the stairs. If you do not have them, ask someone to add them for you. What else can I do to help prevent falls?  Wear shoes that:  Do not have high heels.  Have rubber bottoms.  Are  comfortable and fit you well.  Are closed at the toe. Do not wear sandals.  If you use a stepladder:  Make sure that it is fully opened. Do not climb a closed stepladder.  Make sure that both sides of the stepladder are locked into place.  Ask someone to hold it for you, if possible.  Clearly mark and make sure that you can see:  Any grab bars or handrails.  First and last steps.  Where the edge of each step is.  Use tools that help you move around (mobility aids) if they are needed. These include:  Canes.  Walkers.  Scooters.  Crutches.  Turn on the lights when you go into a dark area. Replace any light bulbs as soon as they burn out.  Set up your furniture so you have a clear path. Avoid moving your furniture around.  If any of your floors are uneven, fix them.  If there are any pets around you, be aware of where they are.  Review your medicines with your doctor. Some medicines can make you feel dizzy. This can increase your chance of falling. Ask your doctor what other things that you can do to help prevent falls. This information is not intended to replace advice given to you by your health care provider. Make sure you discuss any questions you have with your health care provider. Document Released: 04/03/2009 Document Revised: 11/13/2015 Document Reviewed: 07/12/2014 Elsevier Interactive Patient Education  2017 [employer].

## 2020-10-01 NOTE — Patient Instructions (Signed)

## 2020-10-01 NOTE — Progress Notes (Signed)
I,Tianna Badgett,acting as a Education administrator for Maximino Greenland, MD.,have documented all relevant documentation on the behalf of Maximino Greenland, MD,as directed by  Maximino Greenland, MD while in the presence of Maximino Greenland, MD.  This visit occurred during the SARS-CoV-2 public health emergency.  Safety protocols were in place, including screening questions prior to the visit, additional usage of staff PPE, and extensive cleaning of exam room while observing appropriate contact time as indicated for disinfecting solutions.  Subjective:     Patient ID: Jessica Crosby , female    DOB: 1950-08-10 , 70 y.o.   MRN: [id number]   Chief Complaint  Patient presents with  . Hypertension    HPI  Patient is here for htn follow up. She reports compliance with medications. She denies headaches, chest pain and palpitations. She is also scheduled for AWV with [facility] Advisor.   Hypertension This is a chronic problem. The current episode started more than 1 year ago. The problem has been gradually improving since onset. The problem is controlled. Pertinent negatives include no blurred vision, chest pain, palpitations or shortness of breath. Compliance problems include exercise.      Past Medical History:  Diagnosis Date  . Arthritis   . GERD (gastroesophageal reflux disease)   . Hx of scarlet fever    as achild  . Hypertension   . Thyroid disease    hyperparathyroidism  . Vitamin D deficiency      Family History  Problem Relation Age of Onset  . Breast cancer Sister 24  . Prostate cancer Father 50  . Breast cancer Maternal Aunt 81  . Prostate cancer Paternal Uncle   . Throat cancer Paternal Grandfather        pipe smoker  . Breast cancer Sister 51  . Lymphoma Sister 26  . Leukemia Sister 46       as a result of chemotherapy from her lymphoma  . Cancer Sister 39       non hodgins lymphoma/ leukemia  . Pancreatic cancer Maternal Aunt 65  . Colon cancer Maternal Aunt        diagnosed in  her 17s  . Thyroid cancer Maternal Aunt        dx in her 57s  . Leukemia Maternal Aunt        diagnosed in her 97s  . Breast cancer Cousin        diagnosed in her 33s  . Prostate cancer Paternal Uncle      Current Outpatient Medications:  .  aspirin 81 MG tablet, Take 81 mg by mouth daily., Disp: , Rfl:  .  atorvastatin (LIPITOR) 20 MG tablet, Take 1 tablet (20 mg total) by mouth daily., Disp: 90 tablet, Rfl: 0 .  calcium carbonate (OS-CAL) 600 MG TABS, Take 600 mg by mouth 2 (two) times daily with a meal., Disp: , Rfl:  .  Cholecalciferol (VITAMIN D PO), Take 2,000 mg by mouth daily. , Disp: , Rfl:  .  escitalopram (LEXAPRO) 10 MG tablet, Take 1 tablet (10 mg total) by mouth daily., Disp: 90 tablet, Rfl: 1 .  estradiol (ESTRACE) 0.1 MG/GM vaginal cream, Place vaginally., Disp: , Rfl:  .  hydrochlorothiazide (HYDRODIURIL) 12.5 MG tablet, Take 12.5 mg by mouth daily., Disp: , Rfl:  .  losartan (COZAAR) 100 MG tablet, Take 100 mg by mouth daily., Disp: , Rfl:  .  Magnesium 250 MG TABS, Take by mouth., Disp: , Rfl:  .  Multiple Vitamin (THERA)  TABS, Take 1 tablet by mouth daily., Disp: , Rfl:  .  Omega-3 Fatty Acids (FISH OIL) 1000 MG CAPS, Take 1 capsule by mouth., Disp: , Rfl:    Allergies  Allergen Reactions  . Ace Inhibitors Cough     Review of Systems  Constitutional: Negative.   Eyes: Negative for blurred vision.  Respiratory: Negative.  Negative for shortness of breath.   Cardiovascular: Negative.  Negative for chest pain and palpitations.  Gastrointestinal: Negative.   Neurological: Negative.      Today's Vitals   10/01/20 1112  BP: 132/70  Pulse: 73  Temp: 98 F (36.7 C)  TempSrc: Oral  Weight: 162 lb 14.4 oz (73.9 kg)  Height: 5' 3.4" (1.61 m)   Body mass index is 28.49 kg/m.   Objective:  Physical Exam Vitals and nursing note reviewed.  Constitutional:      Appearance: Normal appearance.  HENT:     Head: Normocephalic and atraumatic.     Nose:      Comments: Masked     Mouth/Throat:     Comments: Masked  Cardiovascular:     Rate and Rhythm: Normal rate and regular rhythm.     Heart sounds: Normal heart sounds.  Pulmonary:     Effort: Pulmonary effort is normal.     Breath sounds: Normal breath sounds.  Musculoskeletal:     Cervical back: Normal range of motion.  Skin:    General: Skin is warm.  Neurological:     General: No focal deficit present.     Mental Status: She is alert.  Psychiatric:        Mood and Affect: Mood normal.        Behavior: Behavior normal.         Assessment And Plan:     1. Essential hypertension Comments: Chronic, fair control. I will not make any med changes today. Encouraged to follow low sodium diet.  - CMP14+EGFR  2. Female climacteric state Comments: She has had NO hot flashes since starting escitalopram. She would like to continue with medication.  3. Overweight with body mass index (BMI) of 28 to 28.9 in adult Comments: Her BMI is acceptable for her demographic. She is encouraged to c/w her regular exercise regimen, aiming for at least 150 minutes of exercise per week.  Patient was given opportunity to ask questions. Patient verbalized understanding of the plan and was able to repeat key elements of the plan. All questions were answered to their satisfaction.   I, Maximino Greenland, MD, have reviewed all documentation for this visit. The documentation on 10/01/20 for the exam, diagnosis, procedures, and orders are all accurate and complete.   IF YOU HAVE BEEN REFERRED TO A SPECIALIST, IT MAY TAKE 1-2 WEEKS TO SCHEDULE/PROCESS THE REFERRAL. IF YOU HAVE NOT HEARD FROM US/SPECIALIST IN TWO WEEKS, PLEASE GIVE [place] A CALL AT [phone].   THE PATIENT IS ENCOURAGED TO PRACTICE SOCIAL DISTANCING DUE TO THE COVID-19 PANDEMIC.

## 2020-10-02 LAB — CMP14+EGFR
ALT: 32 IU/L (ref 0–32)
AST: 23 IU/L (ref 0–40)
Albumin/Globulin Ratio: 2.4 — ABNORMAL HIGH (ref 1.2–2.2)
Albumin: 4.7 g/dL (ref 3.8–4.8)
Alkaline Phosphatase: 82 IU/L (ref 44–121)
BUN/Creatinine Ratio: 19 (ref 12–28)
BUN: 19 mg/dL (ref 8–27)
Bilirubin Total: 0.6 mg/dL (ref 0.0–1.2)
CO2: 22 mmol/L (ref 20–29)
Calcium: 9.7 mg/dL (ref 8.7–10.3)
Chloride: 102 mmol/L (ref 96–106)
Creatinine, Ser: 1 mg/dL (ref 0.57–1.00)
Globulin, Total: 2 g/dL (ref 1.5–4.5)
Glucose: 88 mg/dL (ref 65–99)
Potassium: 4.2 mmol/L (ref 3.5–5.2)
Sodium: 140 mmol/L (ref 134–144)
Total Protein: 6.7 g/dL (ref 6.0–8.5)
eGFR: 61 mL/min/{1.73_m2} (ref >59)

## 2020-10-08 ENCOUNTER — Ambulatory Visit: Payer: PPO | Admitting: Internal Medicine

## 2020-10-13 ENCOUNTER — Telehealth: Payer: PPO

## 2020-10-15 ENCOUNTER — Other Ambulatory Visit: Payer: Self-pay

## 2020-10-15 ENCOUNTER — Ambulatory Visit: Payer: PPO | Admitting: Obstetrics and Gynecology

## 2020-10-15 ENCOUNTER — Encounter: Payer: Self-pay | Admitting: Obstetrics and Gynecology

## 2020-10-15 VITALS — BP 122/64 | HR 79 | Ht 64.0 in | Wt 163.0 lb

## 2020-10-15 DIAGNOSIS — Z4689 Encounter for fitting and adjustment of other specified devices: Secondary | ICD-10-CM

## 2020-10-15 DIAGNOSIS — T8389XA Other specified complication of genitourinary prosthetic devices, implants and grafts, initial encounter: Secondary | ICD-10-CM | POA: Diagnosis not present

## 2020-10-15 DIAGNOSIS — N898 Other specified noninflammatory disorders of vagina: Secondary | ICD-10-CM

## 2020-10-15 DIAGNOSIS — N952 Postmenopausal atrophic vaginitis: Secondary | ICD-10-CM

## 2020-10-15 DIAGNOSIS — N814 Uterovaginal prolapse, unspecified: Secondary | ICD-10-CM

## 2020-10-15 NOTE — Progress Notes (Signed)
GYNECOLOGY  VISIT   HPI: 70 y.o.   Married White or Caucasian Not Hispanic or Latino  female   [phone] with Patient's last menstrual period was 06/21/1998.   here for pessary maintenance.  She has a symptomatic cystocele and uterine prolapse. She has a #6 ring pessary with support. Using vaginal estrogen twice a week, no bleeding. No irritation.   Only rare urge incontinence with a full bladder, only a few drops. Not bothersome. Not needing pads any more.  No bowel c/o.  Sexually active, leaves the pessary in. No discomfort.  GYNECOLOGIC HISTORY: Patient's last menstrual period was 06/21/1998. Contraception:PMP Menopausal hormone therapy: estrace        OB History    Gravida  3   Para  3   Term  3   Preterm      AB      Living  3     SAB      IAB      Ectopic      Multiple      Live Births                 Patient Active Problem List   Diagnosis Date Noted  . Night sweats 04/13/2020  . Primary insomnia 04/13/2020  . Situational anxiety 04/13/2020  . Vitamin D deficiency disease 04/13/2020  . Allergic rhinitis due to other allergen 06/10/2017  . Depression 06/10/2017  . Pericarditis 06/10/2017  . Scarlet fever 06/10/2017  . Single skin nodule 06/25/2014  . Multiple thyroid nodules 01/08/2014  . Essential hypertension 12/25/2013  . H/O hyperparathyroidism 12/25/2013  . Abdominal pain, chronic, epigastric 05/09/2012  . Hypercalcemia 12/30/2008  . Family history of malignant neoplasm of breast 03/30/2006  . Fibrocystic breast disease 03/30/2006  . Enthesopathy of hip region 08/30/2005  . Esophageal reflux 12/27/2003  . Mixed hyperlipidemia 02/12/2003  . Osteopenia 01/26/2000    Past Medical History:  Diagnosis Date  . Arthritis   . GERD (gastroesophageal reflux disease)   . Hx of scarlet fever    as achild  . Hypertension   . Thyroid disease    hyperparathyroidism  . Vitamin D deficiency     Past Surgical History:  Procedure Laterality  Date  . CHOLECYSTECTOMY  1999  . PARATHYROIDECTOMY Right 2010    Current Outpatient Medications  Medication Sig Dispense Refill  . aspirin 81 MG tablet Take 81 mg by mouth daily.    [name] atorvastatin (LIPITOR) 20 MG tablet Take 1 tablet (20 mg total) by mouth daily. 90 tablet 0  . calcium carbonate (OS-CAL) 600 MG TABS Take 600 mg by mouth 2 (two) times daily with a meal.    . Cholecalciferol (VITAMIN D PO) Take 2,000 mg by mouth daily.     [name] escitalopram (LEXAPRO) 10 MG tablet Take 1 tablet (10 mg total) by mouth daily. 90 tablet 1  . estradiol (ESTRACE) 0.1 MG/GM vaginal cream Place vaginally.    . hydrochlorothiazide (HYDRODIURIL) 12.5 MG tablet Take 12.5 mg by mouth daily.    [name] losartan (COZAAR) 100 MG tablet Take 100 mg by mouth daily.    . Magnesium 250 MG TABS Take by mouth.    . Multiple Vitamin (THERA) TABS Take 1 tablet by mouth daily.    . Omega-3 Fatty Acids (FISH OIL) 1000 MG CAPS Take 1 capsule by mouth.     No current facility-administered medications for this visit.     ALLERGIES: Ace inhibitors  Family History  Problem Relation Age of  Onset  . Breast cancer Sister 28  . Prostate cancer Father 55  . Breast cancer Maternal Aunt 81  . Prostate cancer Paternal Uncle   . Throat cancer Paternal Grandfather        pipe smoker  . Breast cancer Sister 46  . Lymphoma Sister 1  . Leukemia Sister 26       as a result of chemotherapy from her lymphoma  . Cancer Sister 56       non hodgins lymphoma/ leukemia  . Pancreatic cancer Maternal Aunt 65  . Colon cancer Maternal Aunt        diagnosed in her 69s  . Thyroid cancer Maternal Aunt        dx in her 44s  . Leukemia Maternal Aunt        diagnosed in her 21s  . Breast cancer Cousin        diagnosed in her 31s  . Prostate cancer Paternal Uncle     Social History   Socioeconomic History  . Marital status: Married    Spouse name: Not on file  . Number of children: Not on file  . Years of education: Not on file  .  Highest education level: Not on file  Occupational History  . Not on file  Tobacco Use  . Smoking status: Never Smoker  . Smokeless tobacco: Never Used  Vaping Use  . Vaping Use: Never used  Substance and Sexual Activity  . Alcohol use: No  . Drug use: No  . Sexual activity: Yes    Partners: Male    Birth control/protection: Post-menopausal  Other Topics Concern  . Not on file  Social History Narrative  . Not on file   Social Determinants of Health   Financial Resource Strain: Low Risk   . Difficulty of Paying Living Expenses: Not hard at all  Food Insecurity: No Food Insecurity  . Worried About Charity fundraiser in the Last Year: Never true  . Ran Out of Food in the Last Year: Never true  Transportation Needs: No Transportation Needs  . Lack of Transportation (Medical): No  . Lack of Transportation (Non-Medical): No  Physical Activity: Sufficiently Active  . Days of Exercise per Week: 5 days  . Minutes of Exercise per Session: 90 min  Stress: No Stress Concern Present  . Feeling of Stress : Not at all  Social Connections: Not on file  Intimate Partner Violence: Not on file    Review of Systems  All other systems reviewed and are negative.   PHYSICAL EXAMINATION:    BP 122/64   Pulse 79   Ht 5\' 4"  (1.626 m)   Wt 163 lb (73.9 kg)   LMP 06/21/1998   SpO2 99%   BMI 27.98 kg/m     General appearance: alert, cooperative and appears stated age   Pelvic: External genitalia:  no lesions              Urethra:  normal appearing urethra with no masses, tenderness or lesions              Bartholins and Skenes: normal                 Vagina: the pessary was removed and cleaned, 4 x 3 cm patch of erythema, irritation in the posterior vaginal fornix. Pessary replaced.              Cervix: no lesions  Chaperone was present for exam.  1.  Pessary maintenance Feeling great with the pessary, but having vaginal irritaiton  2. Cystocele with uterine prolapse Controlled  with the pessary  3. Vaginal atrophy Using vaginal estrogen  4. Vaginal irritation from pessary [facility]) Taught the patient how to insert and remove the pessary. Will have her take the pessary out overnight 3 x a week for the next 2 weeks, use estrogen cream each of those nights. After 2 weeks she can change this to 2 x a week. F/U in 4 weeks

## 2020-10-15 NOTE — Patient Instructions (Signed)
Take the pessary out every Monday, Wednesday and Friday night. Place the estrogen cream prior to bed, then replace the pessary in the am. After 2 weeks, you can change this to 2 nights a week.

## 2020-10-22 ENCOUNTER — Telehealth: Payer: PPO

## 2020-10-23 ENCOUNTER — Telehealth: Payer: Self-pay

## 2020-10-23 NOTE — Chronic Care Management (AMB) (Addendum)
Chronic Care Management Pharmacy Assistant   Name: Jessica Crosby  MRN: [id number] DOB: 08-Dec-1950   Reason for Encounter: Medication Review/ Medication coordination  Recent office visits:  10-01-2020 Glendale Chard, MD  Recent consult visits:  10-15-2020 Salvadore Dom, MD [facility] visits:  None in previous 6 months  Medications: Outpatient Encounter Medications as of 10/23/2020  Medication Sig Note   aspirin 81 MG tablet Take 81 mg by mouth daily.    atorvastatin (LIPITOR) 20 MG tablet Take 1 tablet (20 mg total) by mouth daily.    calcium carbonate (OS-CAL) 600 MG TABS Take 600 mg by mouth 2 (two) times daily with a meal.    Cholecalciferol (VITAMIN D PO) Take 2,000 mg by mouth daily.     escitalopram (LEXAPRO) 10 MG tablet Take 1 tablet (10 mg total) by mouth daily.    estradiol (ESTRACE) 0.1 MG/GM vaginal cream Place vaginally.    hydrochlorothiazide (HYDRODIURIL) 12.5 MG tablet Take 12.5 mg by mouth daily.    losartan (COZAAR) 100 MG tablet Take 100 mg by mouth daily.    Magnesium 250 MG TABS Take by mouth. 06/04/2015: Received from: [address] [place]   Multiple Vitamin (THERA) TABS Take 1 tablet by mouth daily. 06/09/2016: Received from: [place]: Take 1 tablet by mouth daily.   Omega-3 Fatty Acids (FISH OIL) 1000 MG CAPS Take 1 capsule by mouth. 06/04/2015: Received from: [place]   No facility-administered encounter medications on file as of 10/23/2020.   Reviewed chart for medication changes ahead of medication coordination call.   No medication changes indicated OR if recent visit, treatment plan here.  BP Readings from Last 3 Encounters:  10/15/20 122/64  10/01/20 132/70  10/01/20 (!) 150/82    Lab Results  Component Value Date   HGBA1C 5.3 09/26/2019     Patient obtains medications through Vials  90 Days   Last adherence delivery included: None  Patient declined (meds) last month: Aspirin 81 mg one a day  OTC Calcium Carbonate 600 mg one a day OTC Cholecalciferol 2,000 mg one a day OTC Lemborexant - No longer taking - per patient  Losartan - Hydrochlorothiazide 100-12.5 one a day- has a full bottle (90 tablets) she is not for sure how she has so many  She does take every day. Magnesium 250 mg one a day OTC Multiple Vitamin one a day OTC Omega 3- Fish Oil - one a day OTC Temazepam 15 mg tablet one a day no longer taking - per patient  Patient is due for next adherence delivery on: 11-03-2020.  Wasn't successful with contacting patient to reviewed medications and coordinated delivery.  This delivery to include: Losartan 100mg  HCTZ 12.5mg  Atorvastatin 20mg  Lexapro 10mg   Coordinated acute fill for (med) to be delivered (date).- Unable to obtain  Patient declined the following medications (meds) due to (reason)- Unable to obtain  Patient needs refills for: Refill request sent  Losartan 100mg  HCTZ 12.5mg  Atorvastatin 20mg   Confirmed delivery date of 11-03-2020, was unable to advise patient that pharmacy will contact them the morning of delivery.  10-23-2020: 1st attempt Left VM.  10-24-2020: 2nd attempt Left VM.   10-27-2020: 3rd attempt Left VM. Also sent text from [facility]  10-28-2020: 4th attempt Left VM.   Star Rating Drugs: Atorvastatin 20mg - last filled 07-04-2020 [facility] Losartan 100mg - last filled 08-05-2020 [facility]   [place]  [phone]  I have reviewed the care management and care  coordination activities outlined in this encounter and I am certifying that I agree with the content of this note. No further action required.  Total Time 4 minutes  Mayford Knife, [facility] 10/31/20 12:27 PM

## 2020-10-24 ENCOUNTER — Other Ambulatory Visit: Payer: Self-pay

## 2020-10-24 MED ORDER — LOSARTAN POTASSIUM-HCTZ 100-12.5 MG PO TABS: 1.0000 | ORAL_TABLET | Freq: Every day | ORAL | 1 refills | Status: DC

## 2020-10-28 ENCOUNTER — Other Ambulatory Visit: Payer: Self-pay

## 2020-10-28 MED ORDER — ATORVASTATIN CALCIUM 20 MG PO TABS: 20.0000 mg | ORAL_TABLET | Freq: Every day | ORAL | 0 refills | Status: DC

## 2020-11-03 ENCOUNTER — Other Ambulatory Visit: Payer: PPO

## 2020-11-12 ENCOUNTER — Ambulatory Visit: Payer: PPO | Admitting: Obstetrics and Gynecology

## 2020-11-12 ENCOUNTER — Encounter: Payer: Self-pay | Admitting: Obstetrics and Gynecology

## 2020-11-12 ENCOUNTER — Other Ambulatory Visit: Payer: Self-pay

## 2020-11-12 VITALS — BP 122/76 | HR 85 | Ht 64.0 in | Wt 163.0 lb

## 2020-11-12 DIAGNOSIS — N952 Postmenopausal atrophic vaginitis: Secondary | ICD-10-CM

## 2020-11-12 DIAGNOSIS — Z4689 Encounter for fitting and adjustment of other specified devices: Secondary | ICD-10-CM

## 2020-11-12 DIAGNOSIS — N8110 Cystocele, unspecified: Secondary | ICD-10-CM

## 2020-11-12 DIAGNOSIS — N8189 Other female genital prolapse: Secondary | ICD-10-CM

## 2020-11-12 NOTE — Progress Notes (Signed)
GYNECOLOGY  VISIT   HPI: 70 y.o.   Married White or Caucasian Not Hispanic or Latino  female   [phone] with Patient's last menstrual period was 06/21/1998.   here for pessary maintenance  She has a symptomatic cystocele and uterine prolapse. She has a #6 ring pessary with support. Using vaginal estrogen twice a week. Last time she was here she had some vaginal irritation. For 2 weeks she took the pessary out 3 x a week overnight and used estrogen cream. For the last 2 weeks, she has been taking the pessary out 2 x a week overnight and using the estrogen cream.   GYNECOLOGIC HISTORY: Patient's last menstrual period was 06/21/1998. Contraception:pmp  Menopausal hormone therapy: estrace        OB History    Gravida  3   Para  3   Term  3   Preterm      AB      Living  3     SAB      IAB      Ectopic      Multiple      Live Births                 Patient Active Problem List   Diagnosis Date Noted  . Night sweats 04/13/2020  . Primary insomnia 04/13/2020  . Situational anxiety 04/13/2020  . Vitamin D deficiency disease 04/13/2020  . Allergic rhinitis due to other allergen 06/10/2017  . Depression 06/10/2017  . Pericarditis 06/10/2017  . Scarlet fever 06/10/2017  . Single skin nodule 06/25/2014  . Multiple thyroid nodules 01/08/2014  . Essential hypertension 12/25/2013  . H/O hyperparathyroidism 12/25/2013  . Abdominal pain, chronic, epigastric 05/09/2012  . Hypercalcemia 12/30/2008  . Family history of malignant neoplasm of breast 03/30/2006  . Fibrocystic breast disease 03/30/2006  . Enthesopathy of hip region 08/30/2005  . Esophageal reflux 12/27/2003  . Mixed hyperlipidemia 02/12/2003  . Osteopenia 01/26/2000    Past Medical History:  Diagnosis Date  . Arthritis   . GERD (gastroesophageal reflux disease)   . Hx of scarlet fever    as achild  . Hypertension   . Thyroid disease    hyperparathyroidism  . Vitamin D deficiency     Past Surgical  History:  Procedure Laterality Date  . CHOLECYSTECTOMY  1999  . PARATHYROIDECTOMY Right 2010    Current Outpatient Medications  Medication Sig Dispense Refill  . aspirin 81 MG tablet Take 81 mg by mouth daily.    [name] atorvastatin (LIPITOR) 20 MG tablet Take 1 tablet (20 mg total) by mouth daily. 90 tablet 0  . calcium carbonate (OS-CAL) 600 MG TABS Take 600 mg by mouth 2 (two) times daily with a meal.    . Cholecalciferol (VITAMIN D PO) Take 2,000 mg by mouth daily.     [name] escitalopram (LEXAPRO) 10 MG tablet Take 1 tablet (10 mg total) by mouth daily. 90 tablet 1  . estradiol (ESTRACE) 0.1 MG/GM vaginal cream Place vaginally.    . hydrochlorothiazide (HYDRODIURIL) 12.5 MG tablet Take 12.5 mg by mouth daily.    [name] losartan (COZAAR) 100 MG tablet Take 100 mg by mouth daily.    [name] losartan-hydrochlorothiazide (HYZAAR) 100-12.5 MG tablet Take 1 tablet by mouth daily. 90 tablet 1  . Magnesium 250 MG TABS Take by mouth.    . Multiple Vitamin (THERA) TABS Take 1 tablet by mouth daily.    . Omega-3 Fatty Acids (FISH OIL) 1000 MG CAPS Take 1  capsule by mouth.     No current facility-administered medications for this visit.     ALLERGIES: Ace inhibitors  Family History  Problem Relation Age of Onset  . Breast cancer Sister 76  . Prostate cancer Father 50  . Breast cancer Maternal Aunt 81  . Prostate cancer Paternal Uncle   . Throat cancer Paternal Grandfather        pipe smoker  . Breast cancer Sister 66  . Lymphoma Sister 96  . Leukemia Sister 59       as a result of chemotherapy from her lymphoma  . Cancer Sister 22       non hodgins lymphoma/ leukemia  . Pancreatic cancer Maternal Aunt 65  . Colon cancer Maternal Aunt        diagnosed in her 37s  . Thyroid cancer Maternal Aunt        dx in her 71s  . Leukemia Maternal Aunt        diagnosed in her 56s  . Breast cancer Cousin        diagnosed in her 62s  . Prostate cancer Paternal Uncle     Social History   Socioeconomic  History  . Marital status: Married    Spouse name: Not on file  . Number of children: Not on file  . Years of education: Not on file  . Highest education level: Not on file  Occupational History  . Not on file  Tobacco Use  . Smoking status: Never Smoker  . Smokeless tobacco: Never Used  Vaping Use  . Vaping Use: Never used  Substance and Sexual Activity  . Alcohol use: No  . Drug use: No  . Sexual activity: Yes    Partners: Male    Birth control/protection: Post-menopausal  Other Topics Concern  . Not on file  Social History Narrative  . Not on file   Social Determinants of Health   Financial Resource Strain: Low Risk   . Difficulty of Paying Living Expenses: Not hard at all  Food Insecurity: No Food Insecurity  . Worried About Charity fundraiser in the Last Year: Never true  . Ran Out of Food in the Last Year: Never true  Transportation Needs: No Transportation Needs  . Lack of Transportation (Medical): No  . Lack of Transportation (Non-Medical): No  Physical Activity: Sufficiently Active  . Days of Exercise per Week: 5 days  . Minutes of Exercise per Session: 90 min  Stress: No Stress Concern Present  . Feeling of Stress : Not at all  Social Connections: Not on file  Intimate Partner Violence: Not on file    Review of Systems  All other systems reviewed and are negative.   PHYSICAL EXAMINATION:    LMP 06/21/1998     General appearance: alert, cooperative and appears stated age  Pelvic: External genitalia:  no lesions              Urethra:  normal appearing urethra with no masses, tenderness or lesions              Bartholins and Skenes: normal                 Vagina: the pessary was removed and cleaned. Minimal erythema in the vaginal fornix at 4 and 8 o'clock, improved from last month              Cervix: no lesions  Chaperone was present for exam.  1. Pessary maintenance Doing  well F/U in 3 months  2. Vaginal atrophy Doing well Continue taking  the pessary out 2 x a week overnight and using the estrogen cream

## 2020-11-14 ENCOUNTER — Telehealth: Payer: PPO

## 2020-12-04 ENCOUNTER — Encounter: Payer: Self-pay | Admitting: Internal Medicine

## 2020-12-05 ENCOUNTER — Other Ambulatory Visit: Payer: Self-pay | Admitting: Internal Medicine

## 2020-12-05 MED ORDER — NIRMATRELVIR/RITONAVIR (PAXLOVID)TABLET: 3.0000 | ORAL_TABLET | Freq: Two times a day (BID) | ORAL | 0 refills | Status: AC

## 2020-12-06 ENCOUNTER — Encounter: Payer: Self-pay | Admitting: Internal Medicine

## 2020-12-11 ENCOUNTER — Telehealth: Payer: Self-pay

## 2020-12-11 NOTE — Telephone Encounter (Signed)
I returned the pt's call and left a message that DR Baird Cancer wants the pt to try some vicks vapo rub on her neck pain and Dr Baird Cancer also wanted to check on Mr. Scherer.

## 2020-12-25 ENCOUNTER — Encounter: Payer: Self-pay | Admitting: Internal Medicine

## 2020-12-25 DIAGNOSIS — Z0000 Encounter for general adult medical examination without abnormal findings: Secondary | ICD-10-CM | POA: Diagnosis not present

## 2020-12-25 DIAGNOSIS — Z743 Need for continuous supervision: Secondary | ICD-10-CM | POA: Diagnosis not present

## 2020-12-25 DIAGNOSIS — R55 Syncope and collapse: Secondary | ICD-10-CM | POA: Diagnosis not present

## 2020-12-25 DIAGNOSIS — I10 Essential (primary) hypertension: Secondary | ICD-10-CM | POA: Diagnosis not present

## 2020-12-25 DIAGNOSIS — E860 Dehydration: Secondary | ICD-10-CM | POA: Diagnosis not present

## 2020-12-25 DIAGNOSIS — R9431 Abnormal electrocardiogram [ECG] [EKG]: Secondary | ICD-10-CM | POA: Diagnosis not present

## 2020-12-25 DIAGNOSIS — T675XXA Heat exhaustion, unspecified, initial encounter: Secondary | ICD-10-CM | POA: Diagnosis not present

## 2020-12-25 DIAGNOSIS — R0602 Shortness of breath: Secondary | ICD-10-CM | POA: Diagnosis not present

## 2020-12-26 ENCOUNTER — Telehealth: Payer: PPO

## 2021-01-20 ENCOUNTER — Other Ambulatory Visit: Payer: Self-pay

## 2021-01-20 ENCOUNTER — Telehealth: Payer: Self-pay

## 2021-01-20 MED ORDER — ESCITALOPRAM OXALATE 10 MG PO TABS: 10.0000 mg | ORAL_TABLET | Freq: Every day | ORAL | 1 refills | Status: DC

## 2021-01-20 NOTE — Chronic Care Management (AMB) (Signed)
    Chronic Care Management Pharmacy Assistant   Name: Jessica Crosby  MRN: [id number] DOB: Aug 01, 1950   Reason for Encounter: Medication Review/ Medication Coordination   Recent office visits:  10-01-2020 Kellie Simmering, LPN. Medicare wellness  10-01-2020 Glendale Chard, MD. Albumin/Globulin ratio= 2.4  Recent consult visits:  10-15-2020 Salvadore Dom, MD (OBGYN). Pessary check  11-12-2020 Salvadore Dom, MD (OBGYN). Pessary check  Hospital visits:  None in previous 6 months  Medications: Outpatient Encounter Medications as of 01/20/2021  Medication Sig Note   aspirin 81 MG tablet Take 81 mg by mouth daily.    atorvastatin (LIPITOR) 20 MG tablet Take 1 tablet (20 mg total) by mouth daily.    calcium carbonate (OS-CAL) 600 MG TABS Take 600 mg by mouth 2 (two) times daily with a meal.    Cholecalciferol (VITAMIN D PO) Take 2,000 mg by mouth daily.     escitalopram (LEXAPRO) 10 MG tablet Take 1 tablet (10 mg total) by mouth daily.    estradiol (ESTRACE) 0.1 MG/GM vaginal cream Place vaginally.    hydrochlorothiazide (HYDRODIURIL) 12.5 MG tablet Take 12.5 mg by mouth daily.    losartan (COZAAR) 100 MG tablet Take 100 mg by mouth daily.    losartan-hydrochlorothiazide (HYZAAR) 100-12.5 MG tablet Take 1 tablet by mouth daily.    Magnesium 250 MG TABS Take by mouth. 06/04/2015: Received from: [place]   Multiple Vitamin (THERA) TABS Take 1 tablet by mouth daily. 06/09/2016: Received from: [place]: Take 1 tablet by mouth daily.   Omega-3 Fatty Acids (FISH OIL) 1000 MG CAPS Take 1 capsule by mouth. 06/04/2015: Received from: [place]   No facility-administered encounter medications on file as of 01/20/2021.   Reviewed chart for medication changes ahead of medication coordination call.  No OVs, Consults, or hospital visits since last care coordination call/Pharmacist visit. (If appropriate, list visit date, provider name)  No  medication changes indicated OR if recent visit, treatment plan here.  BP Readings from Last 3 Encounters:  11/12/20 122/76  10/15/20 122/64  10/01/20 132/70    Lab Results  Component Value Date   HGBA1C 5.3 09/26/2019     Patient obtains medications through Vials  90 Days   Last adherence delivery included:  Losartan '100mg'$  HCTZ 12.'5mg'$  Atorvastatin '20mg'$  Lexapro '10mg'$   Patient declined (meds) last month: Unable to obtain  Patient is due for next adherence delivery on: 01-27-2021  Called patient and reviewed medications and coordinated delivery.  This delivery to include: Atorvastatin 20 mg daily Losartan/ HCTZ 100-12.5 mg daily Lexapro 10 mg daily  No short or acute fill needed  Patient declined the following medications: Losartan 100 mg daily HCTZ 12.5 mg   Due to taking combined medication  Patient needs refills for: Lexapro 10 mg daily  Confirmed delivery date of 01-27-2021 advised patient that pharmacy will contact them the morning of delivery.  Care Gaps: Shingrix overdue Covid booster overdue Colonoscopy overdue RAF= 0.435% Medicare wellness 10-14-2021  Star Rating Drugs: Atorvastatin 20 mg- Last filled 10-29-2020 [facility] Losartan/HCTZ 100-12.5 mg daily- Last filled 10-27-2020 [facility]  [place] Clinical Pharmacist Assistant [phone]

## 2021-01-28 ENCOUNTER — Other Ambulatory Visit: Payer: Self-pay

## 2021-01-28 DIAGNOSIS — I10 Essential (primary) hypertension: Secondary | ICD-10-CM

## 2021-02-11 ENCOUNTER — Ambulatory Visit: Payer: PPO | Admitting: Obstetrics and Gynecology

## 2021-02-11 ENCOUNTER — Encounter: Payer: Self-pay | Admitting: Obstetrics and Gynecology

## 2021-02-11 ENCOUNTER — Other Ambulatory Visit: Payer: Self-pay

## 2021-02-11 VITALS — BP 120/80 | HR 77 | Ht 64.0 in | Wt 157.0 lb

## 2021-02-11 DIAGNOSIS — N952 Postmenopausal atrophic vaginitis: Secondary | ICD-10-CM | POA: Diagnosis not present

## 2021-02-11 DIAGNOSIS — N8110 Cystocele, unspecified: Secondary | ICD-10-CM | POA: Diagnosis not present

## 2021-02-11 DIAGNOSIS — N814 Uterovaginal prolapse, unspecified: Secondary | ICD-10-CM

## 2021-02-11 DIAGNOSIS — Z4689 Encounter for fitting and adjustment of other specified devices: Secondary | ICD-10-CM

## 2021-02-11 DIAGNOSIS — H40013 Open angle with borderline findings, low risk, bilateral: Secondary | ICD-10-CM | POA: Diagnosis not present

## 2021-02-11 NOTE — Progress Notes (Signed)
GYNECOLOGY  VISIT   HPI: 70 y.o.   Married White or Caucasian Not Hispanic or Latino  female   [phone] with Patient's last menstrual period was 06/21/1998.   here for pessary maintenance.  She has a symptomatic cystocele and uterine prolapse. She has a #6 ring pessary with support. She takes the pessary out 2 x a week overnight and uses estrogen cream on those nights. The last week she hasn't done that.  No vaginal bleeding, comfortable.   Her cousins 21 year old son died 2022/10/08 (electrocuted at work).   GYNECOLOGIC HISTORY: Patient's last menstrual period was 06/21/1998. Contraception:pmp  Menopausal hormone therapy: yes, estrace.         OB History     Gravida  3   Para  3   Term  3   Preterm      AB      Living  3      SAB      IAB      Ectopic      Multiple      Live Births                 Patient Active Problem List   Diagnosis Date Noted   Night sweats 04/13/2020   Primary insomnia 04/13/2020   Situational anxiety 04/13/2020   Vitamin D deficiency disease 04/13/2020   Allergic rhinitis due to other allergen 06/10/2017   Depression 06/10/2017   Pericarditis 06/10/2017   Scarlet fever 06/10/2017   Single skin nodule 06/25/2014   Multiple thyroid nodules 01/08/2014   Essential hypertension 12/25/2013   H/O hyperparathyroidism 12/25/2013   Abdominal pain, chronic, epigastric 05/09/2012   Hypercalcemia 12/30/2008   Family history of malignant neoplasm of breast 03/30/2006   Fibrocystic breast disease 03/30/2006   Enthesopathy of hip region 08/30/2005   Esophageal reflux 12/27/2003   Mixed hyperlipidemia 02/12/2003   Osteopenia 01/26/2000    Past Medical History:  Diagnosis Date   Arthritis    GERD (gastroesophageal reflux disease)    Hx of scarlet fever    as achild   Hypertension    Thyroid disease    hyperparathyroidism   Vitamin D deficiency     Past Surgical History:  Procedure Laterality Date   CHOLECYSTECTOMY  1999    PARATHYROIDECTOMY Right 2010    Current Outpatient Medications  Medication Sig Dispense Refill   aspirin 81 MG tablet Take 81 mg by mouth daily.     atorvastatin (LIPITOR) 20 MG tablet Take 1 tablet (20 mg total) by mouth daily. 90 tablet 0   calcium carbonate (OS-CAL) 600 MG TABS Take 600 mg by mouth 2 (two) times daily with a meal.     Cholecalciferol (VITAMIN D PO) Take 2,000 mg by mouth daily.      escitalopram (LEXAPRO) 10 MG tablet Take 1 tablet (10 mg total) by mouth daily. 90 tablet 1   estradiol (ESTRACE) 0.1 MG/GM vaginal cream Place vaginally.     hydrochlorothiazide (HYDRODIURIL) 12.5 MG tablet Take 12.5 mg by mouth daily.     losartan (COZAAR) 100 MG tablet Take 100 mg by mouth daily.     losartan-hydrochlorothiazide (HYZAAR) 100-12.5 MG tablet Take 1 tablet by mouth daily. 90 tablet 1   Magnesium 250 MG TABS Take by mouth.     Multiple Vitamin (THERA) TABS Take 1 tablet by mouth daily.     Omega-3 Fatty Acids (FISH OIL) 1000 MG CAPS Take 1 capsule by mouth.     No current  facility-administered medications for this visit.     ALLERGIES: Ace inhibitors  Family History  Problem Relation Age of Onset   Breast cancer Sister 36   Prostate cancer Father 22   Breast cancer Maternal Aunt 81   Prostate cancer Paternal Uncle    Throat cancer Paternal Grandfather        pipe smoker   Breast cancer Sister 14   Lymphoma Sister 76   Leukemia Sister 58       as a result of chemotherapy from her [place] Sister 11       non hodgins lymphoma/ leukemia   Pancreatic cancer Maternal Aunt 41   Colon cancer Maternal Aunt        diagnosed in her 72s   Thyroid cancer Maternal Aunt        dx in her 41s   Leukemia Maternal Aunt        diagnosed in her 54s   Breast cancer Cousin        diagnosed in her 11s   Prostate cancer Paternal Uncle     Social History   Socioeconomic History   Marital status: Married    Spouse name: Not on file   Number of children: Not on file    Years of education: Not on file   Highest education level: Not on file  Occupational History   Not on file  Tobacco Use   Smoking status: Never   Smokeless tobacco: Never  Vaping Use   Vaping Use: Never used  Substance and Sexual Activity   Alcohol use: No   Drug use: No   Sexual activity: Yes    Partners: Male    Birth control/protection: Post-menopausal  Other Topics Concern   Not on file  Social History Narrative   Not on file   Social Determinants of Health   Financial Resource Strain: Low Risk    Difficulty of Paying Living Expenses: Not hard at all  Food Insecurity: No Food Insecurity   Worried About Charity fundraiser in the Last Year: Never true   [employer] in the Last Year: Never true  Transportation Needs: No Transportation Needs   Lack of Transportation (Medical): No   Lack of Transportation (Non-Medical): No  Physical Activity: Sufficiently Active   Days of Exercise per Week: 5 days   Minutes of Exercise per Session: 90 min  Stress: No Stress Concern Present   Feeling of Stress : Not at all  Social Connections: Not on file  Intimate Partner Violence: Not on file    Review of Systems  All other systems reviewed and are negative.  PHYSICAL EXAMINATION:    BP 120/80   Pulse 77   Ht '5\' 4"'$  (1.626 m)   Wt 157 lb (71.2 kg)   LMP 06/21/1998   SpO2 100%   BMI 26.95 kg/m     General appearance: alert, cooperative and appears stated age  Pelvic: External genitalia:  no lesions              Urethra:  normal appearing urethra with no masses, tenderness or lesions              Bartholins and Skenes: normal                 Vagina: the pessary was removed and cleaned. Slight erythema in the posterior vagina. No ulcerations, no granulation tissue. 1 gram of estrace cream was placed in the vagina. The pessary  was replaced.               Cervix: no lesions                Chaperone was present for exam.  1. Pessary maintenance Doing great F/U in 3  months  2. Vaginal atrophy Continue with vaginal estrogen 2 x a week (continue to leave the pessary out overnight 2 x a week)

## 2021-03-12 ENCOUNTER — Other Ambulatory Visit: Payer: Self-pay

## 2021-03-12 ENCOUNTER — Ambulatory Visit
Admission: RE | Admit: 2021-03-12 | Discharge: 2021-03-12 | Disposition: A | Discharge status: Home / Self Care | Payer: PPO | Source: Ambulatory Visit | Attending: Obstetrics and Gynecology | Admitting: Obstetrics and Gynecology

## 2021-03-12 DIAGNOSIS — Z780 Asymptomatic menopausal state: Secondary | ICD-10-CM | POA: Diagnosis not present

## 2021-03-12 DIAGNOSIS — E2839 Other primary ovarian failure: Secondary | ICD-10-CM

## 2021-03-12 DIAGNOSIS — Z8739 Personal history of other diseases of the musculoskeletal system and connective tissue: Secondary | ICD-10-CM

## 2021-03-12 DIAGNOSIS — M8589 Other specified disorders of bone density and structure, multiple sites: Secondary | ICD-10-CM | POA: Diagnosis not present

## 2021-04-01 ENCOUNTER — Other Ambulatory Visit: Payer: Self-pay

## 2021-04-01 MED ORDER — ESTRADIOL 0.1 MG/GM VA CREA: TOPICAL_CREAM | VAGINAL | 0 refills | Status: DC

## 2021-04-01 NOTE — Telephone Encounter (Signed)
AEX 06/30/2020 Neg Mammo 08/25/2020

## 2021-04-07 ENCOUNTER — Telehealth: Payer: Self-pay

## 2021-04-07 NOTE — Chronic Care Management (AMB) (Signed)
04/07/2021- Called patient to check if she needed refills of Hydrochlorothiazide 12.5 mg, spoke with patient, she is now getting combination medication again, Losartan- Hydrochlorothiazide 100/12.5 mg.  [name], [place] Pharmacist Assistant [phone]'

## 2021-04-09 ENCOUNTER — Ambulatory Visit (INDEPENDENT_AMBULATORY_CARE_PROVIDER_SITE_OTHER): Payer: PPO | Admitting: Internal Medicine

## 2021-04-09 ENCOUNTER — Encounter: Payer: Self-pay | Admitting: Internal Medicine

## 2021-04-09 ENCOUNTER — Other Ambulatory Visit: Payer: Self-pay

## 2021-04-09 VITALS — BP 136/88 | HR 79 | Temp 98.1°F | Ht 64.0 in | Wt 160.6 lb

## 2021-04-09 DIAGNOSIS — Z23 Encounter for immunization: Secondary | ICD-10-CM

## 2021-04-09 DIAGNOSIS — I10 Essential (primary) hypertension: Secondary | ICD-10-CM | POA: Diagnosis not present

## 2021-04-09 DIAGNOSIS — T671XXD Heat syncope, subsequent encounter: Secondary | ICD-10-CM | POA: Diagnosis not present

## 2021-04-09 DIAGNOSIS — E782 Mixed hyperlipidemia: Secondary | ICD-10-CM | POA: Diagnosis not present

## 2021-04-09 LAB — CMP14+EGFR
ALT: 47 IU/L — ABNORMAL HIGH (ref 0–32)
AST: 28 IU/L (ref 0–40)
Albumin/Globulin Ratio: 2.4 — ABNORMAL HIGH (ref 1.2–2.2)
Albumin: 4.8 g/dL (ref 3.8–4.8)
Alkaline Phosphatase: 94 IU/L (ref 44–121)
BUN/Creatinine Ratio: 16 (ref 12–28)
BUN: 16 mg/dL (ref 8–27)
Bilirubin Total: 0.5 mg/dL (ref 0.0–1.2)
CO2: 27 mmol/L (ref 20–29)
Calcium: 10 mg/dL (ref 8.7–10.3)
Chloride: 102 mmol/L (ref 96–106)
Creatinine, Ser: 1.01 mg/dL — ABNORMAL HIGH (ref 0.57–1.00)
Globulin, Total: 2 g/dL (ref 1.5–4.5)
Glucose: 101 mg/dL — ABNORMAL HIGH (ref 70–99)
Potassium: 4.7 mmol/L (ref 3.5–5.2)
Sodium: 143 mmol/L (ref 134–144)
Total Protein: 6.8 g/dL (ref 6.0–8.5)
eGFR: 60 mL/min/{1.73_m2} (ref >59)

## 2021-04-09 LAB — CBC
Hematocrit: 43.9 % (ref 34.0–46.6)
Hemoglobin: 14.7 g/dL (ref 11.1–15.9)
MCH: 30.2 pg (ref 26.6–33.0)
MCHC: 33.5 g/dL (ref 31.5–35.7)
MCV: 90 fL (ref 79–97)
Platelets: 307 10*3/uL (ref 150–450)
RBC: 4.86 x10E6/uL (ref 3.77–5.28)
RDW: 12.9 % (ref 11.7–15.4)
WBC: 6.8 10*3/uL (ref 3.4–10.8)

## 2021-04-09 LAB — LIPID PANEL
Chol/HDL Ratio: 2.5 ratio (ref 0.0–4.4)
Cholesterol, Total: 185 mg/dL (ref 100–199)
HDL: 74 mg/dL (ref >39)
LDL Chol Calc (NIH): 90 mg/dL (ref 0–99)
Triglycerides: 118 mg/dL (ref 0–149)
VLDL Cholesterol Cal: 21 mg/dL (ref 5–40)

## 2021-04-09 MED ORDER — ESCITALOPRAM OXALATE 10 MG PO TABS: 10.0000 mg | ORAL_TABLET | Freq: Every day | ORAL | 2 refills | Status: DC

## 2021-04-09 MED ORDER — LOSARTAN POTASSIUM-HCTZ 100-12.5 MG PO TABS: 1.0000 | ORAL_TABLET | Freq: Every day | ORAL | 2 refills | Status: DC

## 2021-04-09 MED ORDER — ATORVASTATIN CALCIUM 20 MG PO TABS: 20.0000 mg | ORAL_TABLET | Freq: Every day | ORAL | 2 refills | Status: DC

## 2021-04-09 MED ORDER — SHINGRIX 50 MCG/0.5ML IM SUSR: 0.5000 mL | Freq: Once | INTRAMUSCULAR | 0 refills | Status: AC

## 2021-04-09 NOTE — Patient Instructions (Signed)
Cooking With Less Salt Cooking with less salt is one way to reduce the amount of sodium you get from food. Sodium is one of the elements that make up salt. It is found naturally in foods and is also added to certain foods. Depending on your condition and overall health, your health care provider or dietitian may recommend that you reduce your sodium intake. Most people should have less than 2,300 milligrams (mg) of sodium each day. If you have high blood pressure (hypertension), you may need to limit your sodium to 1,500 mg each day. Follow the tipsbelow to help reduce your sodium intake. What are tips for eating less sodium? Reading food labels  Check the food label before buying or using packaged ingredients. Always check the label for the serving size and sodium content. Look for products with no more than 140 mg of sodium in one serving. Check the % Daily Value column to see what percent of the daily recommended amount of sodium is provided in one serving of the product. Foods with 5% or less in this column are considered low in sodium. Foods with 20% or higher are considered high in sodium. Do not choose foods with salt as one of the first three ingredients on the ingredients list. If salt is one of the first three ingredients, it usually means the item is high in sodium.  Shopping Buy sodium-free or low-sodium products. Look for the following words on food labels: Low-sodium. Sodium-free. Reduced-sodium. No salt added. Unsalted. Always check the sodium content even if foods are labeled as low-sodium or no salt added. Buy fresh foods. Cooking Use herbs, seasonings without salt, and spices as substitutes for salt. Use sodium-free baking soda when baking. Grill, braise, or roast foods to add flavor with less salt. Avoid adding salt to pasta, rice, or hot cereals. Drain and rinse canned vegetables, beans, and meat before use. Avoid adding salt when cooking sweets and desserts. Cook with  low-sodium ingredients. What foods are high in sodium? Vegetables Regular canned vegetables (not low-sodium or reduced-sodium). Sauerkraut, pickled vegetables, and relishes. Olives. French fries. Onion rings. Regular canned tomato sauce and paste. Regular tomato and vegetable juice. Frozenvegetables in sauces. Grains Instant hot cereals. Bread stuffing, pancake, and biscuit mixes. Croutons. Seasoned rice or pasta mixes. Noodle soup cups. Boxed or frozen macaroni and cheese. Regular salted crackers. Self-rising flour. Rolls. Bagels. Flourtortillas and wraps. Meats and other proteins Meat or fish that is salted, canned, smoked, cured, spiced, or pickled. This includes bacon, ham, sausages, hot dogs, corned beef, chipped beef, meat loaves, salt pork, jerky, pickled herring, anchovies, regular canned tuna, andsardines. Salted nuts. Dairy Processed cheese and cheese spreads. Cheese curds. Blue cheese. Feta cheese.String cheese. Regular cottage cheese. Buttermilk. Canned milk. The items listed above may not be a complete list of foods high in sodium. Actual amounts of sodium may be different depending on processing. Contact a dietitian for more information. What foods are low in sodium? Fruits Fresh, frozen, or canned fruit with no sauce added. Fruit juice. Vegetables Fresh or frozen vegetables with no sauce added. "No salt added" canned vegetables. "No salt added" tomato sauce and paste. Low-sodium orreduced-sodium tomato and vegetable juice. Grains Noodles, pasta, quinoa, rice. Shredded or puffed wheat or puffed rice. Regular or quick oats (not instant). Low-sodium crackers. Low-sodium bread. Whole-grainbread and whole-grain pasta. Unsalted popcorn. Meats and other proteins Fresh or frozen whole meats, poultry (not injected with sodium), and fish with no sauce added. Unsalted nuts. Dried peas, beans, and   lentils without added salt. Unsalted canned beans. Eggs. Unsalted nut butters. Low-sodium canned  tunaor chicken. Dairy Milk. Soy milk. Yogurt. Low-sodium cheeses, such as Swiss, Monterey Jack, mozzarella, and ricotta. Sherbet or ice cream (keep to  cup per serving).Cream cheese. Fats and oils Unsalted butter or margarine. Other foods Homemade pudding. Sodium-free baking soda and baking powder. Herbs and spices.Low-sodium seasoning mixes. Beverages Coffee and tea. Carbonated beverages. The items listed above may not be a complete list of foods low in sodium. Actual amounts of sodium may be different depending on processing. Contact a dietitian for more information. What are some salt alternatives when cooking? The following are herbs, seasonings, and spices that can be used instead of salt to flavor your food. Herbs should be fresh or dried. Do not choose packaged mixes. Next to the name of the herb, spice, or seasoning aresome examples of foods you can pair it with. Herbs Bay leaves - Soups, meat and vegetable dishes, and spaghetti sauce. Basil - Italian dishes, soups, pasta, and fish dishes. Cilantro - Meat, poultry, and vegetable dishes. Chili powder - Marinades and Mexican dishes. Chives - Salad dressings and potato dishes. Cumin - Mexican dishes, couscous, and meat dishes. Dill - Fish dishes, sauces, and salads. Fennel - Meat and vegetable dishes, breads, and cookies. Garlic (do not use garlic salt) - Italian dishes, meat dishes, salad dressings, and sauces. Marjoram - Soups, potato dishes, and meat dishes. Oregano - Pizza and spaghetti sauce. Parsley - Salads, soups, pasta, and meat dishes. Rosemary - Italian dishes, salad dressings, soups, and red meats. Saffron - Fish dishes, pasta, and some poultry dishes. Sage - Stuffings and sauces. Tarragon - Fish and poultry dishes. Thyme - Stuffing, meat, and fish dishes. Seasonings Lemon juice - Fish dishes, poultry dishes, vegetables, and salads. Vinegar - Salad dressings, vegetables, and fish dishes. Spices Cinnamon - Sweet  dishes, such as cakes, cookies, and puddings. Cloves - Gingerbread, puddings, and marinades for meats. Curry - Vegetable dishes, fish and poultry dishes, and stir-fry dishes. Ginger - Vegetable dishes, fish dishes, and stir-fry dishes. Nutmeg - Pasta, vegetables, poultry, fish dishes, and custard. Summary Cooking with less salt is one way to reduce the amount of sodium that you get from food. Buy sodium-free or low-sodium products. Check the food label before using or buying packaged ingredients. Use herbs, seasonings without salt, and spices as substitutes for salt in foods. This information is not intended to replace advice given to you by your health care provider. Make sure you discuss any questions you have with your healthcare provider. Document Revised: 05/30/2019 Document Reviewed: 05/30/2019 Elsevier Patient Education  2022 Elsevier Inc.  
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
## 2021-04-17 ENCOUNTER — Telehealth: Payer: Self-pay

## 2021-04-17 NOTE — Chronic Care Management (AMB) (Signed)
Chronic Care Management Pharmacy Assistant   Name: Jessica Crosby  MRN: [id number] DOB: [birth year]   Reason for Encounter: Medication Review/ Medication coordination   Recent office visits:  04-09-2021 Glendale Chard, MD. Flu vaccine given. Shingrix ordered. Glucose= 101, Creatinine= 1.01, Albumin/Globulin= 2.4, ALT= 47. STOP HCTZ and losartan.  Recent consult visits:  02-11-2021 Salvadore Dom, MD (OBGYN). Follow up. Return in 3 months. Continue with vaginal estrogen 2 x a week (continue to leave the pessary out overnight 2 x a week)   Hospital visits:  None in previous 6 months  Medications: Outpatient Encounter Medications as of 04/17/2021  Medication Sig Note   aspirin 81 MG tablet Take 81 mg by mouth daily.    atorvastatin (LIPITOR) 20 MG tablet Take 1 tablet (20 mg total) by mouth daily.    calcium carbonate (OS-CAL) 600 MG TABS Take 600 mg by mouth 2 (two) times daily with a meal.    Cholecalciferol (VITAMIN D PO) Take 2,000 mg by mouth daily.     escitalopram (LEXAPRO) 10 MG tablet Take 1 tablet (10 mg total) by mouth daily.    estradiol (ESTRACE) 0.1 MG/GM vaginal cream Apply one gram vaginally twice weekly.    losartan-hydrochlorothiazide (HYZAAR) 100-12.5 MG tablet Take 1 tablet by mouth daily.    Magnesium 250 MG TABS Take by mouth. 06/04/2015: Received from: [place]   Multiple Vitamin (THERA) TABS Take 1 tablet by mouth daily. 06/09/2016: Received from: [place]: Take 1 tablet by mouth daily.   Omega-3 Fatty Acids (FISH OIL) 1000 MG CAPS Take 1 capsule by mouth. 06/04/2015: Received from: [place]   No facility-administered encounter medications on file as of 04/17/2021.   Reviewed chart for medication changes ahead of medication coordination call.  No OVs, Consults, or hospital visits since last care coordination call/Pharmacist visit. (If appropriate, list visit date, provider name)  No medication changes indicated OR  if recent visit, treatment plan here.  BP Readings from Last 3 Encounters:  04/09/21 136/88  02/11/21 120/80  11/12/20 122/76    Lab Results  Component Value Date   HGBA1C 5.3 09/26/2019     Patient obtains medications through V[facility]  90 Days   Last adherence delivery included:  Atorvastatin 20 mg daily Losartan/ HCTZ 100-12.5 mg daily Lexapro 10 mg daily   Patient declined (meds) last month: Losartan 100 mg daily HCTZ 12.5 mg                         Due to taking combined medication  Patient is due for next adherence delivery on: 04-24-2021 Called patient and reviewed medications and coordinated delivery.  This delivery to include: Atorvastatin 20 mg daily Losartan/ HCTZ 100-12.5 mg daily Lexapro 10 mg daily  Patient will need a short fill of (med), prior to adherence delivery. (To align with sync date or if PRN med)  Coordinated acute fill for (med) to be delivered (date).  Patient declined the following medications (meds) due to (reason)  Patient needs refills for: None  Confirmed delivery date of 04-24-2021 advised patient that pharmacy will contact them the morning of delivery.  NOTES: Patient states she will be out of losartan/HCTZ on Sunday 11-06 and out of town 04-27-10-09. Sent pharmacy message about delivery change.  Care Gaps: Shingrix overdue Covid booster overdue last completed 08-03-2019 Colonoscopy overdue last completed 12-18-2010  Star Rating Drugs: Atorvastatin 20 mg- Last filled 01-23-2021 [facility] Losartan/HCTZ 100-12.5 mg-  Last filled 01-23-2021 [facility]  [facility] Clinical Pharmacist Assistant [phone]

## 2021-05-27 NOTE — Progress Notes (Signed)
GYNECOLOGY  VISIT   HPI: 70 y.o.   Married White or Caucasian Not Hispanic or Latino  female   [phone] with Patient's last menstrual period was 06/21/1998.   here for pessary maintenance  She has a symptomatic cystocele and uterine prolapse. She has a #6 ring pessary with support. She takes the pessary out 2 x a week overnight and uses estrogen cream on those nights. She needs a refill of the cream. No bleeding, no discomfort, no bowel or bladder c/o.   GYNECOLOGIC HISTORY: Patient's last menstrual period was 06/21/1998. Contraception:PMP  Menopausal hormone therapy: estrace         OB History     Gravida  3   Para  3   Term  3   Preterm      AB      Living  3      SAB      IAB      Ectopic      Multiple      Live Births                 Patient Active Problem List   Diagnosis Date Noted   Night sweats 04/13/2020   Primary insomnia 04/13/2020   Situational anxiety 04/13/2020   Vitamin D deficiency disease 04/13/2020   Allergic rhinitis due to other allergen 06/10/2017   Depression 06/10/2017   Pericarditis 06/10/2017   Scarlet fever 06/10/2017   Single skin nodule 06/25/2014   Multiple thyroid nodules 01/08/2014   Essential hypertension 12/25/2013   H/O hyperparathyroidism 12/25/2013   Abdominal pain, chronic, epigastric 05/09/2012   Hypercalcemia 12/30/2008   Family history of malignant neoplasm of breast 03/30/2006   Fibrocystic breast disease 03/30/2006   Enthesopathy of hip region 08/30/2005   Esophageal reflux 12/27/2003   Mixed hyperlipidemia 02/12/2003   Osteopenia 01/26/2000    Past Medical History:  Diagnosis Date   Arthritis    GERD (gastroesophageal reflux disease)    Hx of scarlet fever    as achild   Hypertension    Thyroid disease    hyperparathyroidism   Vitamin D deficiency     Past Surgical History:  Procedure Laterality Date   CHOLECYSTECTOMY  1999   PARATHYROIDECTOMY Right 2010    Current Outpatient Medications   Medication Sig Dispense Refill   aspirin 81 MG tablet Take 81 mg by mouth daily.     atorvastatin (LIPITOR) 20 MG tablet Take 1 tablet (20 mg total) by mouth daily. 90 tablet 2   calcium carbonate (OS-CAL) 600 MG TABS Take 600 mg by mouth 2 (two) times daily with a meal.     Cholecalciferol (VITAMIN D PO) Take 2,000 mg by mouth daily.      escitalopram (LEXAPRO) 10 MG tablet Take 1 tablet (10 mg total) by mouth daily. 90 tablet 2   estradiol (ESTRACE) 0.1 MG/GM vaginal cream Apply one gram vaginally twice weekly. 42.5 g 0   losartan-hydrochlorothiazide (HYZAAR) 100-12.5 MG tablet Take 1 tablet by mouth daily. 90 tablet 2   Magnesium 250 MG TABS Take by mouth.     Multiple Vitamin (THERA) TABS Take 1 tablet by mouth daily.     Omega-3 Fatty Acids (FISH OIL) 1000 MG CAPS Take 1 capsule by mouth.     No current facility-administered medications for this visit.     ALLERGIES: Ace inhibitors  Family History  Problem Relation Age of Onset   Breast cancer Sister 52   Prostate cancer Father 66  Breast cancer Maternal Aunt 81   Prostate cancer Paternal Uncle    Throat cancer Paternal Grandfather        pipe smoker   Breast cancer Sister 62   Lymphoma Sister 24   Leukemia Sister 17       as a result of chemotherapy from her [employer] Sister 81       non hodgins lymphoma/ leukemia   Pancreatic cancer Maternal Aunt 63   Colon cancer Maternal Aunt        diagnosed in her 60s   Thyroid cancer Maternal Aunt        dx in her 77s   Leukemia Maternal Aunt        diagnosed in her 6s   Breast cancer Cousin        diagnosed in her 55s   Prostate cancer Paternal Uncle     Social History   Socioeconomic History   Marital status: Married    Spouse name: Not on file   Number of children: Not on file   Years of education: Not on file   Highest education level: Not on file  Occupational History   Not on file  Tobacco Use   Smoking status: Never   Smokeless tobacco: Never   Vaping Use   Vaping Use: Never used  Substance and Sexual Activity   Alcohol use: No   Drug use: No   Sexual activity: Yes    Partners: Male    Birth control/protection: Post-menopausal  Other Topics Concern   Not on file  Social History Narrative   Not on file   Social Determinants of Health   Financial Resource Strain: Low Risk    Difficulty of Paying Living Expenses: Not hard at all  Food Insecurity: No Food Insecurity   Worried About Charity fundraiser in the Last Year: Never true   [place] in the Last Year: Never true  Transportation Needs: No Transportation Needs   Lack of Transportation (Medical): No   Lack of Transportation (Non-Medical): No  Physical Activity: Sufficiently Active   Days of Exercise per Week: 5 days   Minutes of Exercise per Session: 90 min  Stress: No Stress Concern Present   Feeling of Stress : Not at all  Social Connections: Not on file  Intimate Partner Violence: Not on file    Review of Systems  All other systems reviewed and are negative.  PHYSICAL EXAMINATION:    BP 122/68   Pulse 72   LMP 06/21/1998   SpO2 99%     General appearance: alert, cooperative and appears stated age  Pelvic: External genitalia:  no lesions              Urethra:  normal appearing urethra with no masses, tenderness or lesions              Bartholins and Skenes: normal                 Vagina: the pessary was removed and cleaned, some erythema in the posterior vaginal apex. Pessary replaced.              Cervix: no lesions               Chaperone was present for exam.  1. Pessary maintenance She is very happy with the pessary, some irritation noted today  2. Vaginal irritation from pessary [facility]) She will try taking the pessary out every night and putting  it back in in the am. Will continue with 2 x a week vaginal estrogen.  3. Vaginal atrophy Discussed the dosing of estrogen, only using 1 gram 2 x a week. She went through her last refill  quicker than expected.  - estradiol (ESTRACE) 0.1 MG/GM vaginal cream; Apply one gram vaginally twice weekly.  Dispense: 42.5 g; Refill: 1

## 2021-05-28 ENCOUNTER — Ambulatory Visit (INDEPENDENT_AMBULATORY_CARE_PROVIDER_SITE_OTHER): Payer: PPO | Admitting: Obstetrics and Gynecology

## 2021-05-28 ENCOUNTER — Other Ambulatory Visit: Payer: Self-pay

## 2021-05-28 ENCOUNTER — Encounter: Payer: Self-pay | Admitting: Obstetrics and Gynecology

## 2021-05-28 VITALS — BP 122/68 | HR 72

## 2021-05-28 DIAGNOSIS — T8389XA Other specified complication of genitourinary prosthetic devices, implants and grafts, initial encounter: Secondary | ICD-10-CM | POA: Diagnosis not present

## 2021-05-28 DIAGNOSIS — Z4689 Encounter for fitting and adjustment of other specified devices: Secondary | ICD-10-CM | POA: Diagnosis not present

## 2021-05-28 DIAGNOSIS — N952 Postmenopausal atrophic vaginitis: Secondary | ICD-10-CM | POA: Diagnosis not present

## 2021-05-28 DIAGNOSIS — N898 Other specified noninflammatory disorders of vagina: Secondary | ICD-10-CM | POA: Diagnosis not present

## 2021-05-28 MED ORDER — ESTRADIOL 0.1 MG/GM VA CREA: TOPICAL_CREAM | VAGINAL | 1 refills | Status: DC

## 2021-06-01 DIAGNOSIS — Z1211 Encounter for screening for malignant neoplasm of colon: Secondary | ICD-10-CM | POA: Diagnosis not present

## 2021-06-03 LAB — FECAL OCCULT BLOOD, GUAIAC: Fecal Occult Blood: NEGATIVE

## 2021-06-29 ENCOUNTER — Encounter: Payer: Self-pay | Admitting: Internal Medicine

## 2021-07-02 ENCOUNTER — Ambulatory Visit: Payer: PPO | Admitting: Obstetrics and Gynecology

## 2021-07-08 ENCOUNTER — Other Ambulatory Visit: Payer: Self-pay | Admitting: Obstetrics and Gynecology

## 2021-07-08 DIAGNOSIS — Z1231 Encounter for screening mammogram for malignant neoplasm of breast: Secondary | ICD-10-CM

## 2021-07-15 ENCOUNTER — Telehealth: Payer: Self-pay

## 2021-07-15 NOTE — Chronic Care Management (AMB) (Addendum)
° ° °  Chronic Care Management Pharmacy Assistant   Name: Jessica Crosby  MRN: [id number] DOB: [birth year]  Reason for Encounter: Medication Review/ Medication Coordination  Recent office visits:  05-28-2021 Salvadore Dom, MD (OBGYN). Discussed the dosing of estrogen, only using 1 gram 2 x a week. She went through her last refill quicker than expected.   Recent consult visits:  None   Hospital visits:  None in previous 6 months  Medications: Outpatient Encounter Medications as of 07/15/2021  Medication Sig Note   aspirin 81 MG tablet Take 81 mg by mouth daily.    atorvastatin (LIPITOR) 20 MG tablet Take 1 tablet (20 mg total) by mouth daily.    calcium carbonate (OS-CAL) 600 MG TABS Take 600 mg by mouth 2 (two) times daily with a meal.    Cholecalciferol (VITAMIN D PO) Take 2,000 mg by mouth daily.     escitalopram (LEXAPRO) 10 MG tablet Take 1 tablet (10 mg total) by mouth daily.    estradiol (ESTRACE) 0.1 MG/GM vaginal cream Apply one gram vaginally twice weekly.    losartan-hydrochlorothiazide (HYZAAR) 100-12.5 MG tablet Take 1 tablet by mouth daily.    Magnesium 250 MG TABS Take by mouth. 06/04/2015: Received from: [facility]   Multiple Vitamin (THERA) TABS Take 1 tablet by mouth daily. 06/09/2016: Received from: [facility]: Take 1 tablet by mouth daily.   Omega-3 Fatty Acids (FISH OIL) 1000 MG CAPS Take 1 capsule by mouth. 06/04/2015: Received from: [facility]   No facility-administered encounter medications on file as of 07/15/2021.   Reviewed chart for medication changes ahead of medication coordination call.  No OVs, Consults, or hospital visits since last care coordination call/Pharmacist visit.  BP Readings from Last 3 Encounters:  05/28/21 122/68  04/09/21 136/88  02/11/21 120/80    Lab Results  Component Value Date   HGBA1C 5.3 09/26/2019     Patient obtains medications through [facility]  90 Days   Last adherence delivery included:   Atorvastatin 20 mg daily Losartan/ HCTZ 100-12.5 mg daily Lexapro 10 mg daily   Patient declined (meds) last month: None  Patient is due for next adherence delivery on: 07-27-2021  Called patient and reviewed medications and coordinated delivery.  This delivery to include: Atorvastatin 20 mg daily (Patient reported dose decreased to MWF) Losartan/ HCTZ 100-12.5 mg daily Lexapro 10 mg daily  No short/acute fill needed  Patient declined the following medications: Atorvastatin 20 mg daily due to dose changing to 20 mg on MWF. Patient currently has a few days worth of pills and another 90 day supply bottle.  Patient needs refills for: None  Confirmed delivery date of  advised patient that pharmacy will contact them the morning of delivery.  Care Gaps: Shingrix overdue Covid booster overdue last completed 08-03-2019 Colonoscopy overdue last completed 12-18-2010  Star Rating Drugs: Atorvastatin 20 mg- Last filled 04-21-2021 [facility] Losartan/HCTZ 100-12.5 mg- Last filled 04-21-2021 [facility]  [place] Clinical Pharmacist Assistant [phone]

## 2021-07-28 NOTE — Progress Notes (Signed)
71 y.o. G20P3003 Married White or Caucasian Not Hispanic or Latino female here for annual exam and pessary check.  No vaginal bleeding. No dyspareunia. No bowel or bladder c/o.   She has a symptomatic cystocele and uterine prolapse. She has a #6 ring pessary with support. She takes the pessary out 2 x a week overnight and uses estrogen cream on those nights.  Patient's last menstrual period was 06/21/1998.          Sexually active: Yes.    The current method of family planning is post menopausal status.    Exercising: Yes.     Walk, yoga, weightlifting Smoker:  no  Health Maintenance: Pap: 06/22/18-WNL, HPV- neg; 06/10/17-WNL History of abnormal Pap:  no MMG: 08/21/20-birads 1 neg; scheduled for 08/24/21 BMD: 03/12/21-osteopenia, stable. FRAX 11.6/2.2% Colonoscopy: 2012-neg IFOB negative in 12/22 with primary. TDaP: 03/04/15 Gardasil: n/a    Reports that she has never smoked. She has never used smokeless tobacco. She reports that she does not drink alcohol and does not use drugs.Retired from the school system. Watched her grandchildren x 17 years. 3 kids, 8 grandchildren (7-24 years old). Everyone in [place]. Married x 51 years   Past Medical History:  Diagnosis Date   Arthritis    GERD (gastroesophageal reflux disease)    Hx of scarlet fever    as achild   Hypertension    Thyroid disease    hyperparathyroidism   Vitamin D deficiency     Past Surgical History:  Procedure Laterality Date   CHOLECYSTECTOMY  1999   PARATHYROIDECTOMY Right 2010    Current Outpatient Medications  Medication Sig Dispense Refill   aspirin 81 MG tablet Take 81 mg by mouth daily.     atorvastatin (LIPITOR) 20 MG tablet Take 1 tablet (20 mg total) by mouth daily. 90 tablet 2   calcium carbonate (OS-CAL) 600 MG TABS Take 600 mg by mouth 2 (two) times daily with a meal.     Cholecalciferol (VITAMIN D PO) Take 2,000 mg by mouth daily.      escitalopram (LEXAPRO) 10 MG tablet Take 1 tablet (10 mg total) by mouth  daily. 90 tablet 2   estradiol (ESTRACE) 0.1 MG/GM vaginal cream Apply one gram vaginally twice weekly. 42.5 g 1   losartan-hydrochlorothiazide (HYZAAR) 100-12.5 MG tablet Take 1 tablet by mouth daily. 90 tablet 2   Magnesium 250 MG TABS Take by mouth.     Multiple Vitamin (THERA) TABS Take 1 tablet by mouth daily.     Omega-3 Fatty Acids (FISH OIL) 1000 MG CAPS Take 1 capsule by mouth.     No current facility-administered medications for this visit.    Family History  Problem Relation Age of Onset   Breast cancer Sister 63   Prostate cancer Father 48   Breast cancer Maternal Aunt 81   Prostate cancer Paternal Uncle    Throat cancer Paternal Grandfather        pipe smoker   Breast cancer Sister 21   Lymphoma Sister 17   Leukemia Sister 8       as a result of chemotherapy from her [place] Sister 60       non hodgins lymphoma/ leukemia   Pancreatic cancer Maternal Aunt 65   Colon cancer Maternal Aunt        diagnosed in her 46s   Thyroid cancer Maternal Aunt        dx in her 68s   Leukemia Maternal Aunt  diagnosed in her 55s   Breast cancer Cousin        diagnosed in her 41s   Prostate cancer Paternal Uncle   She is one of 5 girls and 3 boys. 2 sisters with breast cancer, 1 sister with non-hodgkins lymphoma, one brother with prostate cancer. MAunt and maternal cousin.   Review of Systems  All other systems reviewed and are negative.  Exam:   BP 132/80 (BP Location: Right Arm)    Pulse 60    Resp 18    Ht 5' 4.37" (1.635 m)    Wt 167 lb 11.2 oz (76.1 kg)    LMP 06/21/1998    SpO2 99%    BMI 28.46 kg/m   Weight change: @WEIGHTCHANGE @ Height:   Height: 5' 4.37" (163.5 cm)  Ht Readings from Last 3 Encounters:  08/06/21 5' 4.37" (1.635 m)  04/09/21 5\' 4"  (1.626 m)  02/11/21 5\' 4"  (1.626 m)    General appearance: alert, cooperative and appears stated age Head: Normocephalic, without obvious abnormality, atraumatic Neck: no adenopathy, supple, symmetrical,  trachea midline and thyroid normal to inspection and palpation Lungs: clear to auscultation bilaterally Cardiovascular: regular rate and rhythm Breasts: normal appearance, no masses or tenderness Abdomen: soft, non-tender; non distended,  no masses,  no organomegaly Extremities: extremities normal, atraumatic, no cyanosis or edema Skin: Skin color, texture, turgor normal. No rashes or lesions Lymph nodes: Cervical, supraclavicular, and axillary nodes normal. No abnormal inguinal nodes palpated Neurologic: Grossly normal   Pelvic: External genitalia:  no lesions              Urethra:  normal appearing urethra with no masses, tenderness or lesions              Bartholins and Skenes: normal                 Vagina: the pessary was removed and cleaned, there is some erythema and mild granulation tissue on the posterior vaginal apex (whole width). The pessary was left out. Mild atrophy              Cervix: no lesions               Bimanual Exam:  Uterus:  normal size, contour, position, consistency, mobility, non-tender              Adnexa: no mass, fullness, tenderness               Rectovaginal: Confirms               Anus:  normal sphincter tone, no lesions  Santiago Glad, CMA chaperoned for the exam.  1. Gynecologic exam normal Discussed breast self exam Discussed calcium and vit D intake No pap this year Mammogram is scheduled IFOB utd  2. Vaginal atrophy Helped with vaginal estrogen, she will call when she needs a refill.   3. Cystocele with uterine prolapse Controlled with the pessary  4. Pessary maintenance New vaginal irritation Needs a break from the pessary  5. Vaginal irritation from pessary (HCC) Leave the pessary out for 2 weeks Increase vaginal estrogen to 1 gram every other day for 2 weeks (she has enough) F/U in 2 weeks  6. Osteopenia, unspecified location Stable On calcium and vit d F/U DEXA in 9/24

## 2021-08-06 ENCOUNTER — Encounter: Payer: Self-pay | Admitting: Obstetrics and Gynecology

## 2021-08-06 ENCOUNTER — Ambulatory Visit (INDEPENDENT_AMBULATORY_CARE_PROVIDER_SITE_OTHER): Payer: PPO | Admitting: Obstetrics and Gynecology

## 2021-08-06 ENCOUNTER — Other Ambulatory Visit: Payer: Self-pay

## 2021-08-06 VITALS — BP 132/80 | HR 60 | Resp 18 | Ht 64.37 in | Wt 167.7 lb

## 2021-08-06 DIAGNOSIS — Z4689 Encounter for fitting and adjustment of other specified devices: Secondary | ICD-10-CM

## 2021-08-06 DIAGNOSIS — N898 Other specified noninflammatory disorders of vagina: Secondary | ICD-10-CM | POA: Diagnosis not present

## 2021-08-06 DIAGNOSIS — M8580 Other specified disorders of bone density and structure, unspecified site: Secondary | ICD-10-CM | POA: Diagnosis not present

## 2021-08-06 DIAGNOSIS — T8389XA Other specified complication of genitourinary prosthetic devices, implants and grafts, initial encounter: Secondary | ICD-10-CM | POA: Diagnosis not present

## 2021-08-06 DIAGNOSIS — N814 Uterovaginal prolapse, unspecified: Secondary | ICD-10-CM

## 2021-08-06 DIAGNOSIS — N952 Postmenopausal atrophic vaginitis: Secondary | ICD-10-CM

## 2021-08-06 DIAGNOSIS — Z01419 Encounter for gynecological examination (general) (routine) without abnormal findings: Secondary | ICD-10-CM

## 2021-08-11 ENCOUNTER — Encounter: Payer: Self-pay | Admitting: Obstetrics and Gynecology

## 2021-08-24 ENCOUNTER — Ambulatory Visit
Admission: RE | Admit: 2021-08-24 | Discharge: 2021-08-24 | Disposition: A | Discharge status: Home / Self Care | Payer: PPO | Source: Ambulatory Visit | Attending: Obstetrics and Gynecology | Admitting: Obstetrics and Gynecology

## 2021-08-24 DIAGNOSIS — Z1231 Encounter for screening mammogram for malignant neoplasm of breast: Secondary | ICD-10-CM

## 2021-08-26 ENCOUNTER — Encounter: Payer: Self-pay | Admitting: Obstetrics and Gynecology

## 2021-08-26 ENCOUNTER — Other Ambulatory Visit: Payer: Self-pay

## 2021-08-26 ENCOUNTER — Ambulatory Visit: Payer: PPO | Admitting: Obstetrics and Gynecology

## 2021-08-26 VITALS — BP 148/84 | HR 80

## 2021-08-26 DIAGNOSIS — T8389XA Other specified complication of genitourinary prosthetic devices, implants and grafts, initial encounter: Secondary | ICD-10-CM | POA: Diagnosis not present

## 2021-08-26 DIAGNOSIS — N898 Other specified noninflammatory disorders of vagina: Secondary | ICD-10-CM | POA: Diagnosis not present

## 2021-08-26 DIAGNOSIS — N952 Postmenopausal atrophic vaginitis: Secondary | ICD-10-CM | POA: Diagnosis not present

## 2021-08-26 DIAGNOSIS — N814 Uterovaginal prolapse, unspecified: Secondary | ICD-10-CM | POA: Diagnosis not present

## 2021-08-26 NOTE — Patient Instructions (Signed)
Use the estrogen cream every night for one week. ? ?Leave the pessary out for one week. ? ?In one week, you can start using the pessary again during the day as needed. Leave it out at night. ? ?When you start using the pessary again, change the estrogen cream to 3 x  a week ? ?F/U in 2 weeks. ?

## 2021-08-26 NOTE — Progress Notes (Signed)
GYNECOLOGY  VISIT ?  ?HPI: ?71 y.o.   Married White or Caucasian Not Hispanic or Latino  female   ?[id number] with Patient's last menstrual period was 06/21/1998.   ?here for f/u after pessary removal and increasing vaginal estrogen.  ?She was seen on 08/06/21 for a routine pessary check. At that time she was taking the pessary out 2 x a week overnight and using the estrogen vaginal cream on those nights. At the time of her last visit she was noted to have vaginal erythema and granulation on the posterior vaginal apex. ?The pessary was left out and she was advised to use the estrogen cream every other day for 2 weeks. She misunderstood and has been using the cream 2 x a week. Doing fine. She hasn't put the pessary back in. It hasn't been too bad.  ? ?GYNECOLOGIC HISTORY: ?Patient's last menstrual period was 06/21/1998. ?Contraception: PM ?Menopausal hormone therapy: estrogen vaginal cream.  ?       ?OB History   ? ? Gravida  ?3  ? Para  ?3  ? Term  ?3  ? Preterm  ?   ? AB  ?   ? Living  ?3  ?  ? ? SAB  ?   ? IAB  ?   ? Ectopic  ?   ? Multiple  ?   ? Live Births  ?   ?   ?  ?  ?    ? ?Patient Active Problem List  ? Diagnosis Date Noted  ? Night sweats 04/13/2020  ? Primary insomnia 04/13/2020  ? Situational anxiety 04/13/2020  ? Vitamin D deficiency disease 04/13/2020  ? Allergic rhinitis due to other allergen 06/10/2017  ? Depression 06/10/2017  ? Pericarditis 06/10/2017  ? Scarlet fever 06/10/2017  ? Single skin nodule 06/25/2014  ? Multiple thyroid nodules 01/08/2014  ? Essential hypertension 12/25/2013  ? H/O hyperparathyroidism 12/25/2013  ? Abdominal pain, chronic, epigastric 05/09/2012  ? Hypercalcemia 12/30/2008  ? Family history of malignant neoplasm of breast 03/30/2006  ? Fibrocystic breast disease 03/30/2006  ? Enthesopathy of hip region 08/30/2005  ? Esophageal reflux 12/27/2003  ? Mixed hyperlipidemia 02/12/2003  ? Osteopenia 01/26/2000  ? ? ?Past Medical History:  ?Diagnosis Date  ? Arthritis   ? GERD  (gastroesophageal reflux disease)   ? Hx of scarlet fever   ? as achild  ? Hypertension   ? Thyroid disease   ? hyperparathyroidism  ? Vitamin D deficiency   ? ? ?Past Surgical History:  ?Procedure Laterality Date  ? CHOLECYSTECTOMY  1999  ? PARATHYROIDECTOMY Right 2010  ? ? ?Current Outpatient Medications  ?Medication Sig Dispense Refill  ? aspirin 81 MG tablet Take 81 mg by mouth daily.    ? atorvastatin (LIPITOR) 20 MG tablet Take 1 tablet (20 mg total) by mouth daily. 90 tablet 2  ? calcium carbonate (OS-CAL) 600 MG TABS Take 600 mg by mouth 2 (two) times daily with a meal.    ? Cholecalciferol (VITAMIN D PO) Take 2,000 mg by mouth daily.     ? escitalopram (LEXAPRO) 10 MG tablet Take 1 tablet (10 mg total) by mouth daily. 90 tablet 2  ? estradiol (ESTRACE) 0.1 MG/GM vaginal cream Apply one gram vaginally twice weekly. 42.5 g 1  ? losartan-hydrochlorothiazide (HYZAAR) 100-12.5 MG tablet Take 1 tablet by mouth daily. 90 tablet 2  ? Magnesium 250 MG TABS Take by mouth.    ? Multiple Vitamin (THERA) TABS Take 1 tablet by mouth  daily.    ? Omega-3 Fatty Acids (FISH OIL) 1000 MG CAPS Take 1 capsule by mouth.    ? ?No current facility-administered medications for this visit.  ?  ? ?ALLERGIES: Ace inhibitors ? ?Family History  ?Problem Relation Age of Onset  ? Breast cancer Sister 89  ? Prostate cancer Father 24  ? Breast cancer Maternal Aunt 6  ? Prostate cancer Paternal Uncle   ? Throat cancer Paternal Grandfather   ?     pipe smoker  ? Breast cancer Sister 62  ? Lymphoma Sister 71  ? Leukemia Sister 65  ?     as a result of chemotherapy from her lymphoma  ? Cancer Sister 15  ?     non hodgins lymphoma/ leukemia  ? Pancreatic cancer Maternal Aunt 22  ? Colon cancer Maternal Aunt   ?     diagnosed in her 50s  ? Thyroid cancer Maternal Aunt   ?     dx in her 3s  ? Leukemia Maternal Aunt   ?     diagnosed in her 17s  ? Breast cancer Cousin   ?     diagnosed in her 52s  ? Prostate cancer Paternal Uncle   ? ? ?Social  History  ? ?Socioeconomic History  ? Marital status: Married  ?  Spouse name: Not on file  ? Number of children: Not on file  ? Years of education: Not on file  ? Highest education level: Not on file  ?Occupational History  ? Not on file  ?Tobacco Use  ? Smoking status: Never  ? Smokeless tobacco: Never  ?Vaping Use  ? Vaping Use: Never used  ?Substance and Sexual Activity  ? Alcohol use: No  ? Drug use: No  ? Sexual activity: Yes  ?  Partners: Male  ?  Birth control/protection: Post-menopausal  ?Other Topics Concern  ? Not on file  ?Social History Narrative  ? Not on file  ? ?Social Determinants of Health  ? ?Financial Resource Strain: Low Risk   ? Difficulty of Paying Living Expenses: Not hard at all  ?Food Insecurity: No Food Insecurity  ? Worried About Charity fundraiser in the Last Year: Never true  ? Ran Out of Food in the Last Year: Never true  ?Transportation Needs: No Transportation Needs  ? Lack of Transportation (Medical): No  ? Lack of Transportation (Non-Medical): No  ?Physical Activity: Sufficiently Active  ? Days of Exercise per Week: 5 days  ? Minutes of Exercise per Session: 90 min  ?Stress: No Stress Concern Present  ? Feeling of Stress : Not at all  ?Social Connections: Not on file  ?Intimate Partner Violence: Not on file  ? ? ?Review of Systems  ?All other systems reviewed and are negative. ? ?PHYSICAL EXAMINATION:   ? ?LMP 06/21/1998     ?General appearance: alert, cooperative and appears stated age ? ?Pelvic: External genitalia:  no lesions ?             Urethra:  normal appearing urethra with no masses, tenderness or lesions ?             Bartholins and Skenes: normal    ?             Vagina: the vaginal erythema has resolved, the granulation tissue has improved some but is still prominent in the posterior vaginal apex off and on over ~5 cm. The granulation tissue was treated with silver nitrate.  ?  Cervix: no lesions ?              ? ?Chaperone was present for exam. ? ?1. Vaginal  irritation from pessary [facility]) ?Granulation tissue treated with silver nitrate.  ?Use the estrogen cream every night for one week. ?Leave the pessary out for one week. ?In one week, she can start using the pessary again during the day as needed. Leave it out at night. ?When she starts using the pessary again, she will change the estrogen cream to 3 x  a week ?F/U in 2 weeks. ? ?2. Vaginal granulation tissue ?See above ? ?3. Cystocele with uterine prolapse ?See above ? ?4. Vaginal atrophy ?See above ? ?

## 2021-08-28 NOTE — Progress Notes (Signed)
GYNECOLOGY  VISIT   HPI: 71 y.o.   Married White or Caucasian Not Hispanic or Latino  female   [phone] with Patient's last menstrual period was 06/21/1998.   here for pessary check. She had developed vaginal irritation and granulation tissue from the pessary. The left the pessary out for a few week, 2 weeks ago the granulation tissue was treated with silver nitrate.  She increased her estrogen use. Over the last week she has been taking the pessary out every night and using the estrogen cream every other day.  No vaginal bleeding, no discomfort.   GYNECOLOGIC HISTORY: Patient's last menstrual period was 06/21/1998. Contraception:PMP Menopausal hormone therapy: Vaginal estrogen        OB History     Gravida  3   Para  3   Term  3   Preterm      AB      Living  3      SAB      IAB      Ectopic      Multiple      Live Births                 Patient Active Problem List   Diagnosis Date Noted   Night sweats 04/13/2020   Primary insomnia 04/13/2020   Situational anxiety 04/13/2020   Vitamin D deficiency disease 04/13/2020   Allergic rhinitis due to other allergen 06/10/2017   Depression 06/10/2017   Pericarditis 06/10/2017   Scarlet fever 06/10/2017   Single skin nodule 06/25/2014   Multiple thyroid nodules 01/08/2014   Essential hypertension 12/25/2013   H/O hyperparathyroidism 12/25/2013   Abdominal pain, chronic, epigastric 05/09/2012   Hypercalcemia 12/30/2008   Family history of malignant neoplasm of breast 03/30/2006   Fibrocystic breast disease 03/30/2006   Enthesopathy of hip region 08/30/2005   Esophageal reflux 12/27/2003   Mixed hyperlipidemia 02/12/2003   Osteopenia 01/26/2000    Past Medical History:  Diagnosis Date   Arthritis    GERD (gastroesophageal reflux disease)    Hx of scarlet fever    as achild   Hypertension    Thyroid disease    hyperparathyroidism   Vitamin D deficiency     Past Surgical History:  Procedure  Laterality Date   CHOLECYSTECTOMY  1999   PARATHYROIDECTOMY Right 2010    Current Outpatient Medications  Medication Sig Dispense Refill   aspirin 81 MG tablet Take 81 mg by mouth daily.     atorvastatin (LIPITOR) 20 MG tablet Take 1 tablet (20 mg total) by mouth daily. 90 tablet 2   calcium carbonate (OS-CAL) 600 MG TABS Take 600 mg by mouth 2 (two) times daily with a meal.     Cholecalciferol (VITAMIN D PO) Take 2,000 mg by mouth daily.      escitalopram (LEXAPRO) 10 MG tablet Take 1 tablet (10 mg total) by mouth daily. 90 tablet 2   estradiol (ESTRACE) 0.1 MG/GM vaginal cream Apply one gram vaginally twice weekly. 42.5 g 1   losartan-hydrochlorothiazide (HYZAAR) 100-12.5 MG tablet Take 1 tablet by mouth daily. 90 tablet 2   Magnesium 250 MG TABS Take by mouth.     Multiple Vitamin (THERA) TABS Take 1 tablet by mouth daily.     Omega-3 Fatty Acids (FISH OIL) 1000 MG CAPS Take 1 capsule by mouth.     No current facility-administered medications for this visit.     ALLERGIES: Ace inhibitors  Family History  Problem Relation Age of Onset  Breast cancer Sister 31   Prostate cancer Father 68   Breast cancer Maternal Aunt 81   Prostate cancer Paternal Uncle    Throat cancer Paternal Grandfather        pipe smoker   Breast cancer Sister 82   Lymphoma Sister 30   Leukemia Sister 23       as a result of chemotherapy from her lymphoma   Cancer Sister 54       non hodgins lymphoma/ leukemia   Pancreatic cancer Maternal Aunt 35   Colon cancer Maternal Aunt        diagnosed in her 62s   Thyroid cancer Maternal Aunt        dx in her 56s   Leukemia Maternal Aunt        diagnosed in her 65s   Breast cancer Cousin        diagnosed in her 74s   Prostate cancer Paternal Uncle     Social History   Socioeconomic History   Marital status: Married    Spouse name: Not on file   Number of children: Not on file   Years of education: Not on file   Highest education level: Not on file   Occupational History   Not on file  Tobacco Use   Smoking status: Never   Smokeless tobacco: Never  Vaping Use   Vaping Use: Never used  Substance and Sexual Activity   Alcohol use: No   Drug use: No   Sexual activity: Yes    Partners: Male    Birth control/protection: Post-menopausal  Other Topics Concern   Not on file  Social History Narrative   Not on file   Social Determinants of Health   Financial Resource Strain: Low Risk    Difficulty of Paying Living Expenses: Not hard at all  Food Insecurity: No Food Insecurity   Worried About Programme researcher, broadcasting/film/video in the Last Year: Never true   Ran Out of Food in the Last Year: Never true  Transportation Needs: No Transportation Needs   Lack of Transportation (Medical): No   Lack of Transportation (Non-Medical): No  Physical Activity: Sufficiently Active   Days of Exercise per Week: 5 days   Minutes of Exercise per Session: 90 min  Stress: No Stress Concern Present   Feeling of Stress : Not at all  Social Connections: Not on file  Intimate Partner Violence: Not on file    ROS  PHYSICAL EXAMINATION:    LMP 06/21/1998     General appearance: alert, cooperative and appears stated age   Pelvic: External genitalia:  no lesions              Urethra:  normal appearing urethra with no masses, tenderness or lesions              Bartholins and Skenes: normal                 Vagina: the pessary was removed and cleaned. The granulation tissue has resolved. Minimal erythema on the upper vagina at 4 o'clock (almost completely healed). The pessary was replaced.               Cervix: no lesions              Chaperone was present for exam.  1. Cystocele with uterine prolapse Well controlled with the pessary She will take the pessary out 3 x a week overnight and use estrogen cream on those nights F/U  in one month  2. Vaginal irritation from pessary (HCC) Healed  3. Vaginal atrophy Using vaginal estrogen.

## 2021-09-11 ENCOUNTER — Encounter: Payer: Self-pay | Admitting: Obstetrics and Gynecology

## 2021-09-11 ENCOUNTER — Ambulatory Visit: Payer: PPO | Admitting: Obstetrics and Gynecology

## 2021-09-11 ENCOUNTER — Other Ambulatory Visit: Payer: Self-pay

## 2021-09-11 VITALS — BP 130/80

## 2021-09-11 DIAGNOSIS — N952 Postmenopausal atrophic vaginitis: Secondary | ICD-10-CM | POA: Diagnosis not present

## 2021-09-11 DIAGNOSIS — N898 Other specified noninflammatory disorders of vagina: Secondary | ICD-10-CM | POA: Diagnosis not present

## 2021-09-11 DIAGNOSIS — T8389XA Other specified complication of genitourinary prosthetic devices, implants and grafts, initial encounter: Secondary | ICD-10-CM

## 2021-09-11 DIAGNOSIS — N814 Uterovaginal prolapse, unspecified: Secondary | ICD-10-CM

## 2021-09-11 NOTE — Patient Instructions (Signed)
Take the pessary out overnight on Monday, Wednesday and Fridays. ?Use the vaginal estrogen on those nights. ?

## 2021-09-23 DIAGNOSIS — H40013 Open angle with borderline findings, low risk, bilateral: Secondary | ICD-10-CM | POA: Diagnosis not present

## 2021-10-05 NOTE — Progress Notes (Signed)
GYNECOLOGY  VISIT ?  ?HPI: ?71 y.o.   Married White or Caucasian Not Hispanic or Latino  female   ?[id number] with Patient's last menstrual period was 06/21/1998.   ?here for  pessary check. She has a #6 ring pessary with support. She had developed some vaginal irritation from the pessary. At her visit last month she was almost completely healed. She has been taking the pessary out overnight 3 nights a week and using vaginal estrogen those nights.  ? ?GYNECOLOGIC HISTORY: ?Patient's last menstrual period was 06/21/1998. ?Contraception:PMP ?Menopausal hormone therapy: Estrogen vag cream ?       ?OB History   ? ? Gravida  ?3  ? Para  ?3  ? Term  ?3  ? Preterm  ?   ? AB  ?   ? Living  ?3  ?  ? ? SAB  ?   ? IAB  ?   ? Ectopic  ?   ? Multiple  ?   ? Live Births  ?   ?   ?  ?  ?    ? ?Patient Active Problem List  ? Diagnosis Date Noted  ? Night sweats 04/13/2020  ? Primary insomnia 04/13/2020  ? Situational anxiety 04/13/2020  ? Vitamin D deficiency disease 04/13/2020  ? Allergic rhinitis due to other allergen 06/10/2017  ? Depression 06/10/2017  ? Pericarditis 06/10/2017  ? Scarlet fever 06/10/2017  ? Single skin nodule 06/25/2014  ? Multiple thyroid nodules 01/08/2014  ? Essential hypertension 12/25/2013  ? H/O hyperparathyroidism 12/25/2013  ? Abdominal pain, chronic, epigastric 05/09/2012  ? Hypercalcemia 12/30/2008  ? Family history of malignant neoplasm of breast 03/30/2006  ? Fibrocystic breast disease 03/30/2006  ? Enthesopathy of hip region 08/30/2005  ? Esophageal reflux 12/27/2003  ? Mixed hyperlipidemia 02/12/2003  ? Osteopenia 01/26/2000  ? ? ?Past Medical History:  ?Diagnosis Date  ? Arthritis   ? GERD (gastroesophageal reflux disease)   ? Hx of scarlet fever   ? as achild  ? Hypertension   ? Thyroid disease   ? hyperparathyroidism  ? Vitamin D deficiency   ? ? ?Past Surgical History:  ?Procedure Laterality Date  ? CHOLECYSTECTOMY  1999  ? PARATHYROIDECTOMY Right 2010  ? ? ?Current Outpatient Medications   ?Medication Sig Dispense Refill  ? aspirin 81 MG tablet Take 81 mg by mouth daily.    ? atorvastatin (LIPITOR) 20 MG tablet Take 1 tablet (20 mg total) by mouth daily. 90 tablet 2  ? calcium carbonate (OS-CAL) 600 MG TABS Take 600 mg by mouth 2 (two) times daily with a meal.    ? Cholecalciferol (VITAMIN D PO) Take 2,000 mg by mouth daily.     ? escitalopram (LEXAPRO) 10 MG tablet Take 1 tablet (10 mg total) by mouth daily. 90 tablet 2  ? estradiol (ESTRACE) 0.1 MG/GM vaginal cream Apply one gram vaginally twice weekly. 42.5 g 1  ? losartan-hydrochlorothiazide (HYZAAR) 100-12.5 MG tablet Take 1 tablet by mouth daily. 90 tablet 2  ? Magnesium 250 MG TABS Take by mouth.    ? Multiple Vitamin (THERA) TABS Take 1 tablet by mouth daily.    ? Omega-3 Fatty Acids (FISH OIL) 1000 MG CAPS Take 1 capsule by mouth.    ? ?No current facility-administered medications for this visit.  ?  ? ?ALLERGIES: Ace inhibitors ? ?Family History  ?Problem Relation Age of Onset  ? Breast cancer Sister 27  ? Prostate cancer Father 54  ? Breast cancer Maternal Aunt  81  ? Prostate cancer Paternal Uncle   ? Throat cancer Paternal Grandfather   ?     pipe smoker  ? Breast cancer Sister 14  ? Lymphoma Sister 21  ? Leukemia Sister 108  ?     as a result of chemotherapy from her lymphoma  ? Cancer Sister 21  ?     non hodgins lymphoma/ leukemia  ? Pancreatic cancer Maternal Aunt 60  ? Colon cancer Maternal Aunt   ?     diagnosed in her 31s  ? Thyroid cancer Maternal Aunt   ?     dx in her 14s  ? Leukemia Maternal Aunt   ?     diagnosed in her 24s  ? Breast cancer Cousin   ?     diagnosed in her 89s  ? Prostate cancer Paternal Uncle   ? ? ?Social History  ? ?Socioeconomic History  ? Marital status: Married  ?  Spouse name: Not on file  ? Number of children: Not on file  ? Years of education: Not on file  ? Highest education level: Not on file  ?Occupational History  ? Not on file  ?Tobacco Use  ? Smoking status: Never  ? Smokeless tobacco: Never   ?Vaping Use  ? Vaping Use: Never used  ?Substance and Sexual Activity  ? Alcohol use: No  ? Drug use: No  ? Sexual activity: Yes  ?  Partners: Male  ?  Birth control/protection: Post-menopausal  ?Other Topics Concern  ? Not on file  ?Social History Narrative  ? Not on file  ? ?Social Determinants of Health  ? ?Financial Resource Strain: Low Risk   ? Difficulty of Paying Living Expenses: Not hard at all  ?Food Insecurity: No Food Insecurity  ? Worried About Charity fundraiser in the Last Year: Never true  ? Ran Out of Food in the Last Year: Never true  ?Transportation Needs: No Transportation Needs  ? Lack of Transportation (Medical): No  ? Lack of Transportation (Non-Medical): No  ?Physical Activity: Sufficiently Active  ? Days of Exercise per Week: 5 days  ? Minutes of Exercise per Session: 90 min  ?Stress: No Stress Concern Present  ? Feeling of Stress : Not at all  ?Social Connections: Not on file  ?Intimate Partner Violence: Not on file  ? ? ?ROS ? ?PHYSICAL EXAMINATION:   ? ?LMP 06/21/1998     ?General appearance: alert, cooperative and appears stated age ? ?Pelvic: External genitalia:  no lesions ?             Urethra:  normal appearing urethra with no masses, tenderness or lesions ?             Bartholins and Skenes: normal    ?             Vagina: the pessary was removed and cleaned, slight erythema in the posterior vaginal apex. No granulation tissue, not friable.  ?             Cervix: no lesions ?              ? ?Chaperone was present for exam. ? ?1. Pessary maintenance ?Minimal irritation from the pessary ?Continue taking the pessary out overnight 3 days a week and use premarin cream those nights ?F/u in 2 months ? ?

## 2021-10-13 ENCOUNTER — Telehealth: Payer: Self-pay

## 2021-10-13 NOTE — Chronic Care Management (AMB) (Signed)
    Chronic Care Management Pharmacy Assistant   Name: Jessica Crosby  MRN: [id number] DOB: 04-May-1951  Reason for Encounter: Medication Review/ Medication coordination  Recent office visits:  None  Recent consult visits:  09-11-2021 Salvadore Dom, MD (OBGYN). Visit for pessary check. Follow up in 1 month.  08-26-2021 Salvadore Dom, MD (OBGYN). Visit for pessary check. Follow up in 2 weeks.  08-06-2021 Salvadore Dom, MD (OBGYN). Annual exam and oessary check.  08-04-2021 Juanita Craver, MD [facility]). Colonoscopy completed.  Hospital visits:  None in previous 6 months  Medications: Outpatient Encounter Medications as of 10/13/2021  Medication Sig Note   aspirin 81 MG tablet Take 81 mg by mouth daily.    atorvastatin (LIPITOR) 20 MG tablet Take 1 tablet (20 mg total) by mouth daily.    calcium carbonate (OS-CAL) 600 MG TABS Take 600 mg by mouth 2 (two) times daily with a meal.    Cholecalciferol (VITAMIN D PO) Take 2,000 mg by mouth daily.     escitalopram (LEXAPRO) 10 MG tablet Take 1 tablet (10 mg total) by mouth daily.    estradiol (ESTRACE) 0.1 MG/GM vaginal cream Apply one gram vaginally twice weekly.    losartan-hydrochlorothiazide (HYZAAR) 100-12.5 MG tablet Take 1 tablet by mouth daily.    Magnesium 250 MG TABS Take by mouth. 06/04/2015: [facility]   Multiple Vitamin (THERA) TABS Take 1 tablet by mouth daily. 06/09/2016: Received from: [facility]: Take 1 tablet by mouth daily.   Omega-3 Fatty Acids (FISH OIL) 1000 MG CAPS Take 1 capsule by mouth. 06/04/2015: Received from: [facility]   No facility-administered encounter medications on file as of 10/13/2021.    Reviewed chart for medication changes ahead of medication coordination call.  No OVs, or hospital visits since last care coordination call/Pharmacist visit.   No medication changes indicated OR if recent visit, treatment plan here.  BP Readings from  Last 3 Encounters:  09/11/21 130/80  08/26/21 (!) 148/84  08/06/21 132/80    Lab Results  Component Value Date   HGBA1C 5.3 09/26/2019     Patient obtains medications through V[facility]  90 Days   Last adherence delivery included:  Losartan/ HCTZ 100-12.5 mg daily Lexapro 10 mg daily  Patient declined (meds) last month: Atorvastatin 20 mg daily due to dose changing to 20 mg on MWF. Patient currently has a few days worth of pills and another 90 day supply bottle.  Patient is due for next adherence delivery on: 10-23-2021.  Called patient and reviewed medications and coordinated delivery.  This delivery to include: Losartan/ HCTZ 100-12.5 mg daily Lexapro 10 mg daily  No acute/short fill needed  Patient declined the following medications: Atorvastatin 20 mg daily due to dose changing to 20 mg on MWF. Patient still has 90 day supply bottle. Patient has an appointment with PCP 10-14-2021, will discuss continued regimen, new [facility] will [facility] if [facility].  Patient needs refills for: None  Confirmed delivery date of 10-23-2021 advised patient that pharmacy will contact them the morning of delivery.  [place] Pharmacist Assistant [phone]

## 2021-10-14 ENCOUNTER — Ambulatory Visit (INDEPENDENT_AMBULATORY_CARE_PROVIDER_SITE_OTHER): Payer: PPO | Admitting: Internal Medicine

## 2021-10-14 ENCOUNTER — Encounter: Payer: Self-pay | Admitting: Internal Medicine

## 2021-10-14 ENCOUNTER — Ambulatory Visit (INDEPENDENT_AMBULATORY_CARE_PROVIDER_SITE_OTHER): Payer: PPO

## 2021-10-14 VITALS — BP 122/70 | HR 88 | Temp 98.2°F | Ht 64.0 in | Wt 170.6 lb

## 2021-10-14 DIAGNOSIS — I10 Essential (primary) hypertension: Secondary | ICD-10-CM | POA: Diagnosis not present

## 2021-10-14 DIAGNOSIS — Z23 Encounter for immunization: Secondary | ICD-10-CM

## 2021-10-14 DIAGNOSIS — F418 Other specified anxiety disorders: Secondary | ICD-10-CM

## 2021-10-14 DIAGNOSIS — Z0000 Encounter for general adult medical examination without abnormal findings: Secondary | ICD-10-CM

## 2021-10-14 DIAGNOSIS — Z6829 Body mass index (BMI) 29.0-29.9, adult: Secondary | ICD-10-CM

## 2021-10-14 DIAGNOSIS — E7800 Pure hypercholesterolemia, unspecified: Secondary | ICD-10-CM

## 2021-10-14 MED ORDER — LOSARTAN POTASSIUM-HCTZ 100-12.5 MG PO TABS: 1.0000 | ORAL_TABLET | Freq: Every day | ORAL | 2 refills | Status: DC

## 2021-10-14 NOTE — Progress Notes (Signed)
?This visit occurred during the SARS-CoV-2 public health emergency.  Safety protocols were in place, including screening questions prior to the visit, additional usage of staff PPE, and extensive cleaning of exam room while observing appropriate contact time as indicated for disinfecting solutions. ? ? ?Patient received shingrix which was billed to [facility] with her consent. ? ? ?Subjective:  ? Jessica Crosby is a 71 y.o. female who presents for Medicare Annual (Subsequent) preventive examination. ? ?Review of Systems    ? ?Cardiac Risk Factors include: advanced age (>39mn, >>82women);dyslipidemia;hypertension ? ?   ?Objective:  ?  ?Today's Vitals  ? 10/14/21 0946  ?BP: 122/70  ?Pulse: 88  ?Temp: 98.2 ?F (36.8 ?C)  ?TempSrc: Oral  ?SpO2: 98%  ?Weight: 170 lb 9.6 oz (77.4 kg)  ?Height: '5\' 4"'$  (1.626 m)  ? ?Body mass index is 29.28 kg/m?. ? ? ?  10/14/2021  ?  9:55 AM 10/01/2020  ? 11:01 AM 09/26/2019  ? 11:38 AM 04/17/2019  ? 12:48 PM 03/22/2019  ? 12:13 PM  ?Advanced Directives  ?Does Patient Have a Medical Advance Directive? Yes No No Yes No  ?Type of AParamed[place]of [place] will   Healthcare Power of Attorney   ?Does patient want to make changes to medical advance directive?    No - Patient declined   ?Copy of HRangelyin Chart? No - copy requested   No - copy requested   ?Would patient like information on creating a medical advance directive?  No - Patient declined No - Patient declined    ? ? ?Current Medications (verified) ?Outpatient Encounter Medications as of 10/14/2021  ?Medication Sig  ? aspirin 81 MG tablet Take 81 mg by mouth daily.  ? atorvastatin (LIPITOR) 20 MG tablet Take 1 tablet (20 mg total) by mouth daily.  ? calcium carbonate (OS-CAL) 600 MG TABS Take 600 mg by mouth 2 (two) times daily with a meal.  ? Cholecalciferol (VITAMIN D PO) Take 2,000 mg by mouth daily.   ? escitalopram (LEXAPRO) 10 MG tablet Take 1 tablet (10 mg total) by mouth  daily.  ? estradiol (ESTRACE) 0.1 MG/GM vaginal cream Apply one gram vaginally twice weekly.  ? losartan-hydrochlorothiazide (HYZAAR) 100-12.5 MG tablet Take 1 tablet by mouth daily.  ? Magnesium 250 MG TABS Take by mouth.  ? Multiple Vitamin (THERA) TABS Take 1 tablet by mouth daily.  ? Omega-3 Fatty Acids (FISH OIL) 1000 MG CAPS Take 1 capsule by mouth.  ? ?No facility-administered encounter medications on file as of 10/14/2021.  ? ? ?Allergies (verified) ?Ace inhibitors  ? ?History: ?Past Medical History:  ?Diagnosis Date  ? Arthritis   ? GERD (gastroesophageal reflux disease)   ? Hx of scarlet fever   ? as achild  ? Hypertension   ? Thyroid disease   ? hyperparathyroidism  ? Vitamin D deficiency   ? ?Past Surgical History:  ?Procedure Laterality Date  ? CHOLECYSTECTOMY  1999  ? PARATHYROIDECTOMY Right 2010  ? ?Family History  ?Problem Relation Age of Onset  ? Breast cancer Sister 488 ? Prostate cancer Father 733 ? Breast cancer Maternal Aunt 891 ? Prostate cancer Paternal Uncle   ? Throat cancer Paternal Grandfather   ?     pipe smoker  ? Breast cancer Sister 546 ? Lymphoma Sister 544 ? Leukemia Sister 580 ?     as a result of chemotherapy from her lymphoma  ? Cancer Sister  5  ?     non hodgins lymphoma/ leukemia  ? Pancreatic cancer Maternal Aunt 76  ? Colon cancer Maternal Aunt   ?     diagnosed in her 31s  ? Thyroid cancer Maternal Aunt   ?     dx in her 40s  ? Leukemia Maternal Aunt   ?     diagnosed in her 55s  ? Breast cancer Cousin   ?     diagnosed in her 68s  ? Prostate cancer Paternal Uncle   ? ?Social History  ? ?Socioeconomic History  ? Marital status: Married  ?  Spouse name: Not on file  ? Number of children: Not on file  ? Years of education: Not on file  ? Highest education level: Not on file  ?Occupational History  ? Not on file  ?Tobacco Use  ? Smoking status: Never  ? Smokeless tobacco: Never  ?Vaping Use  ? Vaping Use: Never used  ?Substance and Sexual Activity  ? Alcohol use: No  ? Drug use:  No  ? Sexual activity: Yes  ?  Partners: Male  ?  Birth control/protection: Post-menopausal  ?Other Topics Concern  ? Not on file  ?Social History Narrative  ? Not on file  ? ?Social Determinants of Health  ? ?Financial Resource Strain: Low Risk   ? Difficulty of Paying Living Expenses: Not hard at all  ?Food Insecurity: No Food Insecurity  ? Worried About Charity fundraiser in the Last Year: Never true  ? Ran Out of Food in the Last Year: Never true  ?Transportation Needs: No Transportation Needs  ? Lack of Transportation (Medical): No  ? Lack of Transportation (Non-Medical): No  ?Physical Activity: Sufficiently Active  ? Days of Exercise per Week: 7 days  ? Minutes of Exercise per Session: 60 min  ?Stress: No Stress Concern Present  ? Feeling of Stress : Not at all  ?Social Connections: Not on file  ? ? ?Tobacco Counseling ?Counseling given: Not Answered ? ? ?Clinical Intake: ? ?Pre-visit preparation completed: Yes ? ?Pain : No/denies pain ? ?  ? ?Nutritional Status: BMI 25 -29 Overweight ?Nutritional Risks: None ?Diabetes: No ? 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 ?Diabetic? no ? ?Interpreter Needed?: No ? ?Information entered by :: NAllen LPN ? ? ?Activities of Daily Living ? ?  10/14/2021  ?  9:56 AM  ?In your present state of health, do you have any difficulty performing the following activities:  ?Hearing? 0  ?Vision? 0  ?Difficulty concentrating or making decisions? 0  ?Walking or climbing stairs? 0  ?Dressing or bathing? 0  ?Doing errands, shopping? 0  ?Preparing Food and eating ? N  ?Using the Toilet? N  ?In the past six months, have you accidently leaked urine? N  ?Do you have problems with loss of bowel control? N  ?Managing your Medications? N  ?Managing your Finances? N  ?Housekeeping or managing your  Housekeeping? N  ? ? ?Patient Care Team: ?Glendale Chard, MD as PCP - General (Internal Medicine) ?Little, Claudette Stapler, RN ? ?Indicate any recent Medical Services you may have received from other than [facility] providers in the past year (date may be approximate). ? ?   ?Assessment:  ? This is a routine wellness examination for [place]. ? ?  Hearing/Vision screen ?Vision Screening - Comments:: Regular eye exams, Dr. Idolina Primer ? ?Dietary issues and exercise activities discussed: ?Current Exercise Habits: Home exercise routine, Type of exercise: walking, Time (Minutes): 60, Frequency (Times/Week): 7, Weekly Exercise (Minutes/Week): 420 ? ? Goals Addressed   ? ?  ?  ?  ?  ? This Visit's Progress  ?  Patient Stated     ?  10/14/2021, wants to eat healthier ?  ? ?  ? ?Depression Screen ? ?  10/14/2021  ?  9:56 AM 04/09/2021  ?  8:37 AM 10/01/2020  ? 11:20 AM 10/01/2020  ? 11:19 AM 10/01/2020  ? 11:01 AM 05/21/2020  ? 12:04 PM 09/26/2019  ? 11:39 AM  ?PHQ 2/9 Scores  ?PHQ - 2 Score 0 0 0 0 0 0 0  ?PHQ- 9 Score  0 0   5   ?  ?Fall Risk ? ?  10/14/2021  ?  9:56 AM 10/01/2020  ? 11:01 AM 09/26/2019  ? 11:38 AM 09/26/2019  ? 10:48 AM 03/28/2019  ? 10:33 AM  ?Fall Risk   ?Falls in the past year? 0 0 0 0 0  ?Number falls in past yr: 0   0   ?Injury with Fall? 0   0   ?Risk for fall due to : Medication side effect Medication side effect Medication side effect    ?Follow up Falls evaluation completed;Education provided;Falls prevention discussed Falls evaluation completed;Education provided;Falls prevention discussed Falls evaluation completed;Education provided;Falls prevention discussed    ? ? ?FALL RISK PREVENTION PERTAINING TO THE HOME: ? ?Any stairs in or around the home? Yes  ?If so, are there any without handrails? No  ?Home free of loose throw rugs in walkways, pet beds, electrical cords, etc? Yes  ?Adequate lighting in your home to reduce risk of falls? Yes  ? ?ASSISTIVE DEVICES UTILIZED TO PREVENT FALLS: ? ?Life alert? No  ?Use of a cane, walker or  w/c? No  ?Grab bars in the bathroom? Yes  ?Shower chair or bench in shower? Yes  ?Elevated toilet seat or a handicapped toilet? Yes  ? ?TIMED UP AND GO: ? ?Was the test performed? No .  ? ? ?Gait steady and fast

## 2021-10-14 NOTE — Patient Instructions (Signed)
Jessica Crosby , ?Thank you for taking time to come for your Medicare Wellness Visit. I appreciate your ongoing commitment to your health goals. Please review the following plan we discussed and let me know if I can assist you in the future.  ? ?Screening recommendations/referrals: ?Colonoscopy: FOBT 06/01/2021, due 06/01/2022 ?Mammogram: completed 3/6/20223, due 08/26/2022 ?Bone Density: completed 03/12/2021 ?Recommended yearly ophthalmology/optometry visit for glaucoma screening and checkup ?Recommended yearly dental visit for hygiene and checkup ? ?Vaccinations: ?Influenza vaccine: due 01/19/2022 ?Pneumococcal vaccine: completed 03/28/2019 ?Tdap vaccine: completed 03/04/2015, due 03/03/2025 ?Shingles vaccine: first dose today   ?Covid-19: 08/03/2019, 07/13/2019 ? ?Advanced directives: Please bring a copy of your POA (Power of Attorney) and/or Living Will to your next appointment.  ? ?Conditions/risks identified: none ? ?Next appointment: Follow up in one year for your annual wellness visit  ? ? ?Preventive Care 53 Years and Older, Female ?Preventive care refers to lifestyle choices and visits with your health care provider that can promote health and wellness. ?What does preventive care include? ?A yearly physical exam. This is also called an annual well check. ?Dental exams once or twice a year. ?Routine eye exams. Ask your health care provider how often you should have your eyes checked. ?Personal lifestyle choices, including: ?Daily care of your teeth and gums. ?Regular physical activity. ?Eating a healthy diet. ?Avoiding tobacco and drug use. ?Limiting alcohol use. ?Practicing safe sex. ?Taking low-dose aspirin every day. ?Taking vitamin and mineral supplements as recommended by your health care provider. ?What happens during an annual well check? ?The services and screenings done by your health care provider during your annual well check will depend on your age, overall health, lifestyle risk factors, and family  history of disease. ?Counseling  ?Your health care provider may ask you questions about your: ?Alcohol use. ?Tobacco use. ?Drug use. ?Emotional well-being. ?Home and relationship well-being. ?Sexual activity. ?Eating habits. ?History of falls. ?Memory and ability to understand (cognition). ?Work and work Statistician. ?Reproductive health. ?Screening  ?You may have the following tests or measurements: ?Height, weight, and BMI. ?Blood pressure. ?Lipid and cholesterol levels. These may be checked every 5 years, or more frequently if you are over 75 years old. ?Skin check. ?Lung cancer screening. You may have this screening every year starting at age 53 if you have a 30-pack-year history of smoking and currently smoke or have quit within the past 15 years. ?Fecal occult blood test (FOBT) of the stool. You may have this test every year starting at age 24. ?Flexible sigmoidoscopy or colonoscopy. You may have a sigmoidoscopy every 5 years or a colonoscopy every 10 years starting at age 66. ?Hepatitis C blood test. ?Hepatitis B blood test. ?Sexually transmitted disease (STD) testing. ?Diabetes screening. This is done by checking your blood sugar (glucose) after you have not eaten for a while (fasting). You may have this done every 1-3 years. ?Bone density scan. This is done to screen for osteoporosis. You may have this done starting at age 78. ?Mammogram. This may be done every 1-2 years. Talk to your health care provider about how often you should have regular mammograms. ?Talk with your health care provider about your test results, treatment options, and if necessary, the need for more tests. ?Vaccines  ?Your health care provider may recommend certain vaccines, such as: ?Influenza vaccine. This is recommended every year. ?Tetanus, diphtheria, and acellular pertussis (Tdap, Td) vaccine. You may need a Td booster every 10 years. ?Zoster vaccine. You may need this after age 43. ?Pneumococcal 13-valent  conjugate (PCV13)  vaccine. One dose is recommended after age 45. ?Pneumococcal polysaccharide (PPSV23) vaccine. One dose is recommended after age 64. ?Talk to your health care provider about which screenings and vaccines you need and how often you need them. ?This information is not intended to replace advice given to you by your health care provider. Make sure you discuss any questions you have with your health care provider. ?Document Released: 07/04/2015 Document Revised: 02/25/2016 Document Reviewed: 04/08/2015 ?Elsevier Interactive Patient Education ? 2017 [employer]. ? ?Fall Prevention in the Home ?Falls can cause injuries. They can happen to people of all ages. There are many things you can do to make your home safe and to help prevent falls. ?What can I do on the outside of my home? ?Regularly fix the edges of walkways and driveways and fix any cracks. ?Remove anything that might make you trip as you walk through a door, such as a raised step or threshold. ?Trim any bushes or trees on the path to your home. ?Use bright outdoor lighting. ?Clear any walking paths of anything that might make someone trip, such as rocks or tools. ?Regularly check to see if handrails are loose or broken. Make sure that both sides of any steps have handrails. ?Any raised decks and porches should have guardrails on the edges. ?Have any leaves, snow, or ice cleared regularly. ?Use sand or salt on walking paths during winter. ?Clean up any spills in your garage right away. This includes oil or grease spills. ?What can I do in the bathroom? ?Use night lights. ?Install grab bars by the toilet and in the tub and shower. Do not use towel bars as grab bars. ?Use non-skid mats or decals in the tub or shower. ?If you need to sit down in the shower, use a plastic, non-slip stool. ?Keep the floor dry. Clean up any water that spills on the floor as soon as it happens. ?Remove soap buildup in the tub or shower regularly. ?Attach bath mats securely with  double-sided non-slip rug tape. ?Do not have throw rugs and other things on the floor that can make you trip. ?What can I do in the bedroom? ?Use night lights. ?Make sure that you have a light by your bed that is easy to reach. ?Do not use any sheets or blankets that are too big for your bed. They should not hang down onto the floor. ?Have a firm chair that has side arms. You can use this for support while you get dressed. ?Do not have throw rugs and other things on the floor that can make you trip. ?What can I do in the kitchen? ?Clean up any spills right away. ?Avoid walking on wet floors. ?Keep items that you use a lot in easy-to-reach places. ?If you need to reach something above you, use a strong step stool that has a grab bar. ?Keep electrical cords out of the way. ?Do not use floor polish or wax that makes floors slippery. If you must use wax, use non-skid floor wax. ?Do not have throw rugs and other things on the floor that can make you trip. ?What can I do with my stairs? ?Do not leave any items on the stairs. ?Make sure that there are handrails on both sides of the stairs and use them. Fix handrails that are broken or loose. Make sure that handrails are as long as the stairways. ?Check any carpeting to make sure that it is firmly attached to the stairs. Fix any carpet that  is loose or worn. ?Avoid having throw rugs at the top or bottom of the stairs. If you do have throw rugs, attach them to the floor with carpet tape. ?Make sure that you have a light switch at the top of the stairs and the bottom of the stairs. If you do not have them, ask someone to add them for you. ?What else can I do to help prevent falls? ?Wear shoes that: ?Do not have high heels. ?Have rubber bottoms. ?Are comfortable and fit you well. ?Are closed at the toe. Do not wear sandals. ?If you use a stepladder: ?Make sure that it is fully opened. Do not climb a closed stepladder. ?Make sure that both sides of the stepladder are locked  into place. ?Ask someone to hold it for you, if possible. ?Clearly mark and make sure that you can see: ?Any grab bars or handrails. ?First and last steps. ?Where the edge of each step is. ?Use tools that help you move

## 2021-10-14 NOTE — Progress Notes (Signed)
?Rich Brave [place]leton,acting as a Education administrator for Maximino Greenland, MD.,have documented all relevant documentation on the behalf of Maximino Greenland, MD,as directed by  Maximino Greenland, MD while in the presence of Maximino Greenland, MD.  ?This visit occurred during the SARS-CoV-2 public health emergency.  Safety protocols were in place, including screening questions prior to the visit, additional usage of staff PPE, and extensive cleaning of exam room while observing appropriate contact time as indicated for disinfecting solutions. ? ?Subjective:  ?  ? Patient ID: Jessica Crosby , female    DOB: 03-Jan-1951 , 71 y.o.   MRN: [id number] ? ? ?Chief Complaint  ?Patient presents with  ? Hypertension  ? ? ?HPI ? ?Patient is here for HTN follow up. She reports compliance with meds. She is currently taking losartan/hctz 100/12.104m daily without any issues. She denies headaches, chest pain and shortness of breath. ? ?[facility] also [facility]  ? ?Hypertension ?This is a chronic problem. The current episode started more than 1 year ago. The problem has been gradually improving since onset. The problem is controlled. Pertinent negatives include no blurred vision, chest pain, palpitations or shortness of breath. Risk factors for coronary artery disease include dyslipidemia and post-menopausal state. Past treatments include angiotensin blockers and diuretics. Compliance problems include exercise.    ? ?Past Medical History:  ?Diagnosis Date  ? Arthritis   ? GERD (gastroesophageal reflux disease)   ? Hx of scarlet fever   ? as achild  ? Hypertension   ? Thyroid disease   ? hyperparathyroidism  ? Vitamin D deficiency   ?  ? ?Family History  ?Problem Relation Age of Onset  ? Breast cancer Sister 434 ? Prostate cancer Father 735 ? Breast cancer Maternal Aunt 875 ? Prostate cancer Paternal Uncle   ? Throat cancer Paternal Grandfather   ?     pipe smoker  ? Breast cancer Sister 525 ? Lymphoma Sister 551 ?  Leukemia Sister 599 ?     as a result of chemotherapy from her lymphoma  ? Cancer Sister 53 ?     non hodgins lymphoma/ leukemia  ? Pancreatic cancer Maternal Aunt 68 ? Colon cancer Maternal Aunt   ?     diagnosed in her 665s ? Thyroid cancer Maternal Aunt   ?     dx in her 744s ? Leukemia Maternal Aunt   ?     diagnosed in her 641s ? Breast cancer Cousin   ?     diagnosed in her 560s ? Prostate cancer Paternal Uncle   ? ? ? ?Current Outpatient Medications:  ?  aspirin 81 MG tablet, Take 81 mg by mouth daily., Disp: , Rfl:  ?  atorvastatin (LIPITOR) 20 MG tablet, Take 1 tablet (20 mg total) by mouth daily., Disp: 90 tablet, Rfl: 2 ?  calcium carbonate (OS-CAL) 600 MG TABS, Take 600 mg by mouth 2 (two) times daily with a meal., Disp: , Rfl:  ?  Cholecalciferol (VITAMIN D PO), Take 2,000 mg by mouth daily. , Disp: , Rfl:  ?  estradiol (ESTRACE) 0.1 MG/GM vaginal cream, Apply one gram vaginally twice weekly., Disp: 42.5 g, Rfl: 1 ?  losartan-hydrochlorothiazide (HYZAAR) 100-12.5 MG tablet, Take 1 tablet by mouth daily., Disp: 90 tablet, Rfl: 2 ?  Magnesium 250 MG TABS, Take by mouth., Disp: , Rfl:  ?  Multiple Vitamin (THERA)  TABS, Take 1 tablet by mouth daily., Disp: , Rfl:  ?  Omega-3 Fatty Acids (FISH OIL) 1000 MG CAPS, Take 1 capsule by mouth., Disp: , Rfl:   ? ?Allergies  ?Allergen Reactions  ? Ace Inhibitors Cough  ?  ? ?Review of Systems  ?Constitutional: Negative.   ?Eyes:  Negative for blurred vision.  ?Respiratory: Negative.  Negative for shortness of breath.   ?Cardiovascular: Negative.  Negative for chest pain and palpitations.  ?Neurological: Negative.   ?Psychiatric/Behavioral: Negative.     ? ?Today's Vitals  ? 10/14/21 1007  ?BP: 122/70  ?Pulse: 88  ?Temp: 98.2 ?F (36.8 ?C)  ?Weight: 170 lb 10.2 oz (77.4 kg)  ?Height: _0  (1.626 m)  ? ?Body mass index is 29.29 kg/m?.  ?Wt Readings from Last 3 Encounters:  ?10/14/21 170 lb 10.2 oz (77.4 kg)  ?10/14/21 170 lb 9.6 oz (77.4 kg)  ?08/06/21 167 lb 11.2 oz  (76.1 kg)  ?  ? ?Objective:  ?Physical Exam ?Vitals and nursing note reviewed.  ?Constitutional:   ?   Appearance: Normal appearance.  ?HENT:  ?   Head: Normocephalic and atraumatic.  ?Eyes:  ?   Extraocular Movements: Extraocular movements intact.  ?Cardiovascular:  ?   Rate and Rhythm: Normal rate and regular rhythm.  ?   Heart sounds: Normal heart sounds.  ?Pulmonary:  ?   Effort: Pulmonary effort is normal.  ?   Breath sounds: Normal breath sounds.  ?Musculoskeletal:  ?   Cervical back: Normal range of motion.  ?Skin: ?   General: Skin is warm.  ?Neurological:  ?   General: No focal deficit present.  ?   Mental Status: She is alert.  ?Psychiatric:     ?   Mood and Affect: Mood normal.     ?   Behavior: Behavior normal.  ?  ? ?   ?Assessment And Plan:  ?   ?1. Essential hypertension ?Comments: Chronic, well controlled. She will c/w losartan/hct daily. Advised to follow low sodium diet. I will check renal function today. She will f/u in 6 months.  ?- Lipid panel ?- CMP14+EGFR ? ?2. Pure hypercholesterolemia ?Comments: Chronic, she is on atorvastatin daily. However, she has been taking 3x/week instead of daily. She agrees to cardiac calcium scoring.  ?- Lipid panel ?- CMP14+EGFR ? ?3. Situational anxiety ?Comments: She is ready to come off of this medication. She will start taking 1/2 pill qd x 1 week, then qod until gone. She will call if her sx recur.  ? ?4. BMI 29.0-29.9,adult ?Comments: She is encouraged to aim for at least 150 minutes of exercise per week.  ?  ?Patient was given opportunity to ask questions. Patient verbalized understanding of the plan and was able to repeat key elements of the plan. All questions were answered to their satisfaction.  ? ?I, Maximino Greenland, MD, have reviewed all documentation for this visit. The documentation on 10/14/21 for the exam, diagnosis, procedures, and orders are all accurate and complete.  ? ?IF YOU HAVE BEEN REFERRED TO A SPECIALIST, IT MAY TAKE 1-2 WEEKS TO  SCHEDULE/PROCESS THE REFERRAL. IF YOU HAVE NOT HEARD FROM US/SPECIALIST IN TWO WEEKS, PLEASE GIVE [place] A CALL AT [phone].  ? ?THE PATIENT IS ENCOURAGED TO PRACTICE SOCIAL DISTANCING DUE TO THE COVID-19 PANDEMIC.   ?

## 2021-10-14 NOTE — Patient Instructions (Signed)
The 10-year ASCVD risk score (Arnett DK, et al., 2019) is: 18.6% ?  Values used to calculate the score: ?    Age: 71 years ?    Sex: Female ?    Is Non-Hispanic African American: No ?    Diabetic: No ?    Tobacco smoker: Yes ?    Systolic Blood Pressure: 481 mmHg ?    Is BP treated: Yes ?    HDL Cholesterol: 74 mg/dL ?    Total Cholesterol: 185 mg/dL ? ?pt is non-smoker ? ?Coronary Calcium Scan ?A coronary calcium scan is an imaging test used to look for deposits of plaque in the inner lining of the blood vessels of the heart (coronary arteries). Plaque is made up of calcium, protein, and fatty substances. These deposits of plaque can partly clog and narrow the coronary arteries without producing any symptoms or warning signs. This puts a person at risk for a heart attack. ?A coronary calcium scan is performed using a computed tomography (CT) scanner machine without using a dye (contrast). ?This test is recommended for people who are at moderate risk for heart disease. The test can find plaque deposits before symptoms develop. ?Tell a health care provider about: ?Any allergies you have. ?All medicines you are taking, including vitamins, herbs, eye drops, creams, and over-the-counter medicines. ?Any problems you or family members have had with anesthetic medicines. ?Any bleeding problems you have. ?Any surgeries you have had. ?Any medical conditions you have. ?Whether you are pregnant or may be pregnant. ?What are the risks? ?Generally, this is a safe procedure. However, problems may occur, including: ?Harm to a pregnant woman and her unborn baby. This test involves the use of radiation. Radiation exposure can be dangerous to a pregnant woman and her unborn baby. If you are pregnant or think you may be pregnant, you should not have this procedure done. ?A slight increase in the risk of cancer. This is because of the radiation involved in the test. The amount of radiation from one test is similar to the amount of  radiation you are naturally exposed to over one year. ?What happens before the procedure? ?Ask your health care provider for any specific instructions on how to prepare for this procedure. You may be asked to avoid products that contain caffeine, tobacco, or nicotine for 4 hours before the procedure. ?What happens during the procedure? ? ?You will undress and remove any jewelry from your neck or chest. You may need to remove hearing aides and dentures. Women may need to remove their bras. ?You will put on a hospital gown. ?Sticky electrodes will be placed on your chest. The electrodes will be connected to an electrocardiogram (ECG) machine to record a tracing of the electrical activity of your heart. ?You will lie down on your back on a curved bed that is attached to the [place]. ?You may be given medicine to slow down your heart rate so that clear pictures can be created. ?You will be moved into the CT scanner, and the CT scanner will take pictures of your heart. During this time, you will be asked to lie still and hold your breath for 10-20 seconds at a time while each picture of your heart is being taken. ?The procedure may vary among health care providers and hospitals. ?What can I expect after the procedure? ?You can return to your normal activities. ?It is up to you to get the results of your procedure. Ask your health care provider, or the  department that is doing the procedure, when your results will be ready. ?Summary ?A coronary calcium scan is an imaging test used to look for deposits of plaque in the inner lining of the blood vessels of the heart. Plaque is made up of calcium, protein, and fatty substances. ?A coronary calcium scan is performed using a CT scanner machine without contrast. ?Generally, this is a safe procedure. Tell your health care provider if you are pregnant or may be pregnant. ?Ask your health care provider for any specific instructions on how to prepare for this procedure. ?You can  return to your normal activities after the scan is done. ?This information is not intended to replace advice given to you by your health care provider. Make sure you discuss any questions you have with your health care provider. ?Document Revised: 05/17/2021 Document Reviewed: 05/17/2021 ?Elsevier Patient Education ? [place]. ? ? ? ? ?

## 2021-10-15 LAB — CMP14+EGFR
ALT: 32 IU/L (ref 0–32)
AST: 24 IU/L (ref 0–40)
Albumin/Globulin Ratio: 2.3 — ABNORMAL HIGH (ref 1.2–2.2)
Albumin: 4.5 g/dL (ref 3.7–4.7)
Alkaline Phosphatase: 97 IU/L (ref 44–121)
BUN/Creatinine Ratio: 13 (ref 12–28)
BUN: 13 mg/dL (ref 8–27)
Bilirubin Total: 0.5 mg/dL (ref 0.0–1.2)
CO2: 26 mmol/L (ref 20–29)
Calcium: 9.6 mg/dL (ref 8.7–10.3)
Chloride: 101 mmol/L (ref 96–106)
Creatinine, Ser: 1.02 mg/dL — ABNORMAL HIGH (ref 0.57–1.00)
Globulin, Total: 2 g/dL (ref 1.5–4.5)
Glucose: 81 mg/dL (ref 70–99)
Potassium: 4.6 mmol/L (ref 3.5–5.2)
Sodium: 141 mmol/L (ref 134–144)
Total Protein: 6.5 g/dL (ref 6.0–8.5)
eGFR: 59 mL/min/{1.73_m2} — ABNORMAL LOW (ref >59)

## 2021-10-15 LAB — LIPID PANEL
Chol/HDL Ratio: 2.3 ratio (ref 0.0–4.4)
Cholesterol, Total: 169 mg/dL (ref 100–199)
HDL: 72 mg/dL (ref >39)
LDL Chol Calc (NIH): 72 mg/dL (ref 0–99)
Triglycerides: 149 mg/dL (ref 0–149)
VLDL Cholesterol Cal: 25 mg/dL (ref 5–40)

## 2021-10-16 ENCOUNTER — Encounter: Payer: Self-pay | Admitting: Obstetrics and Gynecology

## 2021-10-16 ENCOUNTER — Ambulatory Visit: Payer: PPO | Admitting: Obstetrics and Gynecology

## 2021-10-16 VITALS — BP 136/82

## 2021-10-16 DIAGNOSIS — Z4689 Encounter for fitting and adjustment of other specified devices: Secondary | ICD-10-CM | POA: Diagnosis not present

## 2021-10-27 ENCOUNTER — Ambulatory Visit (HOSPITAL_COMMUNITY)
Admission: RE | Admit: 2021-10-27 | Discharge: 2021-10-27 | Disposition: A | Discharge status: Home / Self Care | Payer: Self-pay | Source: Ambulatory Visit | Attending: Internal Medicine | Admitting: Internal Medicine

## 2021-10-27 DIAGNOSIS — E7800 Pure hypercholesterolemia, unspecified: Secondary | ICD-10-CM | POA: Insufficient documentation

## 2021-11-05 ENCOUNTER — Encounter: Payer: PPO | Admitting: Internal Medicine

## 2021-12-07 NOTE — Progress Notes (Signed)
GYNECOLOGY  VISIT   HPI: 71 y.o.   Married White or Caucasian Not Hispanic or Latino  female   [id number] with Patient's last menstrual period was 06/21/1998.   here for pessary check.  She has a #6 ring pessary with support. She is comfortable with the pessary but has had some vaginal irritation. She is taking the pessary out 3 days a week and using premarin cream those nights. No bleeding.   GYNECOLOGIC HISTORY: Patient's last menstrual period was 06/21/1998. Contraception: PM Menopausal hormone therapy: estrace vaginal cream        OB History     Gravida  3   Para  3   Term  3   Preterm      AB      Living  3      SAB      IAB      Ectopic      Multiple      Live Births                 Patient Active Problem List   Diagnosis Date Noted   Night sweats 04/13/2020   Primary insomnia 04/13/2020   Situational anxiety 04/13/2020   Vitamin D deficiency disease 04/13/2020   Allergic rhinitis due to other allergen 06/10/2017   Depression 06/10/2017   Pericarditis 06/10/2017   Scarlet fever 06/10/2017   Single skin nodule 06/25/2014   Multiple thyroid nodules 01/08/2014   Essential hypertension 12/25/2013   H/O hyperparathyroidism 12/25/2013   Abdominal pain, chronic, epigastric 05/09/2012   Hypercalcemia 12/30/2008   Family history of malignant neoplasm of breast 03/30/2006   Fibrocystic breast disease 03/30/2006   Enthesopathy of hip region 08/30/2005   Esophageal reflux 12/27/2003   Mixed hyperlipidemia 02/12/2003   Osteopenia 01/26/2000    Past Medical History:  Diagnosis Date   Arthritis    GERD (gastroesophageal reflux disease)    Hx of scarlet fever    as achild   Hypertension    Thyroid disease    hyperparathyroidism   Vitamin D deficiency     Past Surgical History:  Procedure Laterality Date   CHOLECYSTECTOMY  1999   PARATHYROIDECTOMY Right 2010    Current Outpatient Medications  Medication Sig Dispense Refill   aspirin 81 MG  tablet Take 81 mg by mouth daily.     atorvastatin (LIPITOR) 20 MG tablet Take 1 tablet (20 mg total) by mouth daily. 90 tablet 2   calcium carbonate (OS-CAL) 600 MG TABS Take 600 mg by mouth 2 (two) times daily with a meal.     Cholecalciferol (VITAMIN D PO) Take 2,000 mg by mouth daily.      estradiol (ESTRACE) 0.1 MG/GM vaginal cream Apply one gram vaginally twice weekly. 42.5 g 1   losartan-hydrochlorothiazide (HYZAAR) 100-12.5 MG tablet Take 1 tablet by mouth daily. 90 tablet 2   Magnesium 250 MG TABS Take by mouth.     Multiple Vitamin (THERA) TABS Take 1 tablet by mouth daily.     Omega-3 Fatty Acids (FISH OIL) 1000 MG CAPS Take 1 capsule by mouth.     No current facility-administered medications for this visit.     ALLERGIES: Ace inhibitors  Family History  Problem Relation Age of Onset   Breast cancer Sister 87   Prostate cancer Father 70   Breast cancer Maternal Aunt 81   Prostate cancer Paternal Uncle    Throat cancer Paternal Grandfather        pipe smoker  Breast cancer Sister 15   Lymphoma Sister 3   Leukemia Sister 76       as a result of chemotherapy from her [place] Sister 51       non hodgins lymphoma/ leukemia   Pancreatic cancer Maternal Aunt 89   Colon cancer Maternal Aunt        diagnosed in her 72s   Thyroid cancer Maternal Aunt        dx in her 26s   Leukemia Maternal Aunt        diagnosed in her 70s   Breast cancer Cousin        diagnosed in her 67s   Prostate cancer Paternal Uncle     Social History   Socioeconomic History   Marital status: Married    Spouse name: Not on file   Number of children: Not on file   Years of education: Not on file   Highest education level: Not on file  Occupational History   Not on file  Tobacco Use   Smoking status: Never   Smokeless tobacco: Never  Vaping Use   Vaping Use: Never used  Substance and Sexual Activity   Alcohol use: No   Drug use: No   Sexual activity: Yes    Partners: Male     Birth control/protection: Post-menopausal  Other Topics Concern   Not on file  Social History Narrative   Not on file   Social Determinants of Health   Financial Resource Strain: Low Risk  (10/14/2021)   Overall Financial Resource Strain (CARDIA)    Difficulty of Paying Living Expenses: Not hard at all  Food Insecurity: No Food Insecurity (10/14/2021)   Hunger Vital Sign    Worried About Running Out of Food in the Last Year: Never true    [place] in the Last Year: Never true  Transportation Needs: No Transportation Needs (10/14/2021)   PRAPARE - Hydrologist (Medical): No    Lack of Transportation (Non-Medical): No  Physical Activity: Sufficiently Active (10/14/2021)   Exercise Vital Sign    Days of Exercise per Week: 7 days    Minutes of Exercise per Session: 60 min  Stress: No Stress Concern Present (10/14/2021)   [place]    Feeling of Stress : Not at all  Social Connections: Not on file  Intimate Partner Violence: Not At Risk (03/22/2019)   Humiliation, Afraid, Rape, and Kick questionnaire    Fear of Current or Ex-Partner: No    Emotionally Abused: No    Physically Abused: No    Sexually Abused: No    Review of Systems  All other systems reviewed and are negative.   PHYSICAL EXAMINATION:    BP 128/88   Pulse 72   LMP 06/21/1998   SpO2 96%     General appearance: alert, cooperative and appears stated age  Pelvic: External genitalia:  no lesions              Urethra:  normal appearing urethra with no masses, tenderness or lesions              Bartholins and Skenes: normal                 Vagina: pessary was removed and cleaned, no vaginal irritation              Cervix: no lesions  Chaperone was present for exam.  1. Pessary maintenance Doing well F/U 3 months Continue to take the pessary out 3 nights a week and use premarin those nights

## 2021-12-15 ENCOUNTER — Ambulatory Visit: Payer: PPO

## 2021-12-15 DIAGNOSIS — E782 Mixed hyperlipidemia: Secondary | ICD-10-CM | POA: Diagnosis not present

## 2021-12-15 DIAGNOSIS — I10 Essential (primary) hypertension: Secondary | ICD-10-CM | POA: Diagnosis not present

## 2021-12-15 DIAGNOSIS — K641 Second degree hemorrhoids: Secondary | ICD-10-CM | POA: Diagnosis not present

## 2021-12-15 DIAGNOSIS — Z1211 Encounter for screening for malignant neoplasm of colon: Secondary | ICD-10-CM | POA: Diagnosis not present

## 2021-12-15 DIAGNOSIS — E669 Obesity, unspecified: Secondary | ICD-10-CM | POA: Diagnosis not present

## 2021-12-18 ENCOUNTER — Encounter: Payer: Self-pay | Admitting: Obstetrics and Gynecology

## 2021-12-18 ENCOUNTER — Ambulatory Visit (INDEPENDENT_AMBULATORY_CARE_PROVIDER_SITE_OTHER): Payer: PPO | Admitting: Obstetrics and Gynecology

## 2021-12-18 VITALS — BP 128/88 | HR 72

## 2021-12-18 DIAGNOSIS — Z4689 Encounter for fitting and adjustment of other specified devices: Secondary | ICD-10-CM | POA: Diagnosis not present

## 2021-12-24 ENCOUNTER — Ambulatory Visit (INDEPENDENT_AMBULATORY_CARE_PROVIDER_SITE_OTHER): Payer: PPO | Admitting: Internal Medicine

## 2021-12-24 ENCOUNTER — Encounter: Payer: Self-pay | Admitting: Internal Medicine

## 2021-12-24 VITALS — BP 118/80 | HR 74 | Temp 98.3°F | Ht 63.6 in | Wt 167.0 lb

## 2021-12-24 DIAGNOSIS — E7800 Pure hypercholesterolemia, unspecified: Secondary | ICD-10-CM

## 2021-12-24 DIAGNOSIS — I10 Essential (primary) hypertension: Secondary | ICD-10-CM | POA: Diagnosis not present

## 2021-12-24 DIAGNOSIS — Z23 Encounter for immunization: Secondary | ICD-10-CM | POA: Diagnosis not present

## 2021-12-24 DIAGNOSIS — R8290 Unspecified abnormal findings in urine: Secondary | ICD-10-CM | POA: Diagnosis not present

## 2021-12-24 DIAGNOSIS — Z0000 Encounter for general adult medical examination without abnormal findings: Secondary | ICD-10-CM | POA: Diagnosis not present

## 2021-12-24 DIAGNOSIS — Z6829 Body mass index (BMI) 29.0-29.9, adult: Secondary | ICD-10-CM | POA: Diagnosis not present

## 2021-12-24 LAB — POCT URINALYSIS DIPSTICK
Glucose, UA: NEGATIVE
Ketones, UA: NEGATIVE
Nitrite, UA: NEGATIVE
Protein, UA: POSITIVE — AB
Spec Grav, UA: >=1.03 — AB (ref 1.010–1.025)
Urobilinogen, UA: 0.2 E.U./dL
pH, UA: 7 (ref 5.0–8.0)

## 2021-12-24 NOTE — Progress Notes (Signed)
Rich Brave [place],acting as a Education administrator for Maximino Greenland, MD.,have documented all relevant documentation on the behalf of Maximino Greenland, MD,as directed by  Maximino Greenland, MD while in the presence of Maximino Greenland, MD.  This visit occurred during the SARS-CoV-2 public health emergency.  Safety protocols were in place, including screening questions prior to the visit, additional usage of staff PPE, and extensive cleaning of exam room while observing appropriate contact time as indicated for disinfecting solutions.  Subjective:     Patient ID: Jessica Crosby , female    DOB: 02/01/1951 , 71 y.o.   MRN: [id number]   Chief Complaint  Patient presents with   Annual Exam   Hypertension    HPI  She is here today for a full physical examination.  She is followed by Melvia Heaps for her GYN exams. She denies headaches, chest pain and shortness of breath.   Hypertension This is a chronic problem. The current episode started more than 1 year ago. The problem has been gradually improving since onset. The problem is controlled. Pertinent negatives include no blurred vision. Risk factors for coronary artery disease include post-menopausal state. The current treatment provides moderate improvement. Compliance problems include exercise.      Past Medical History:  Diagnosis Date   Arthritis    GERD (gastroesophageal reflux disease)    Hx of scarlet fever    as achild   Hypertension    Thyroid disease    hyperparathyroidism   Vitamin D deficiency      Family History  Problem Relation Age of Onset   Breast cancer Sister 69   Prostate cancer Father 3   Breast cancer Maternal Aunt 81   Prostate cancer Paternal Uncle    Throat cancer Paternal Grandfather        pipe smoker   Breast cancer Sister 54   Lymphoma Sister 76   Leukemia Sister 22       as a result of chemotherapy from her [place] Sister 78       non hodgins lymphoma/ leukemia   Pancreatic cancer  Maternal Aunt 54   Colon cancer Maternal Aunt        diagnosed in her 36s   Thyroid cancer Maternal Aunt        dx in her 74s   Leukemia Maternal Aunt        diagnosed in her 14s   Breast cancer Cousin        diagnosed in her 76s   Prostate cancer Paternal Uncle      Current Outpatient Medications:    aspirin 81 MG tablet, Take 81 mg by mouth daily., Disp: , Rfl:    atorvastatin (LIPITOR) 20 MG tablet, Take 1 tablet (20 mg total) by mouth daily., Disp: 90 tablet, Rfl: 2   calcium carbonate (OS-CAL) 600 MG TABS, Take 600 mg by mouth 2 (two) times daily with a meal., Disp: , Rfl:    Cholecalciferol (VITAMIN D PO), Take 2,000 mg by mouth daily. , Disp: , Rfl:    estradiol (ESTRACE) 0.1 MG/GM vaginal cream, Apply one gram vaginally twice weekly., Disp: 42.5 g, Rfl: 1   losartan-hydrochlorothiazide (HYZAAR) 100-12.5 MG tablet, Take 1 tablet by mouth daily., Disp: 90 tablet, Rfl: 2   Magnesium 250 MG TABS, Take by mouth., Disp: , Rfl:    Multiple Vitamin (THERA) TABS, Take 1 tablet by mouth daily., Disp: , Rfl:    Omega-3 Fatty Acids (FISH OIL)  1000 MG CAPS, Take 1 capsule by mouth., Disp: , Rfl:    Allergies  Allergen Reactions   Ace Inhibitors Cough      The patient states she uses post menopausal status for birth control. Last LMP was Patient's last menstrual period was 06/21/1998.. Negative for Dysmenorrhea. Negative for: breast discharge, breast lump(s), breast pain and breast self exam. Associated symptoms include abnormal vaginal bleeding. Pertinent negatives include abnormal bleeding (hematology), anxiety, decreased libido, depression, difficulty falling sleep, dyspareunia, history of infertility, nocturia, sexual dysfunction, sleep disturbances, urinary incontinence, urinary urgency, vaginal discharge and vaginal itching. Diet regular.The patient states her exercise level is  moderate.  . The patient's tobacco use is:  Social History   Tobacco Use  Smoking Status Never   Smokeless Tobacco Never  . She has been exposed to passive smoke. The patient's alcohol use is:  Social History   Substance and Sexual Activity  Alcohol Use No   Review of Systems  Constitutional: Negative.   HENT: Negative.    Eyes: Negative.  Negative for blurred vision.  Respiratory: Negative.    Cardiovascular: Negative.   Gastrointestinal: Negative.   Endocrine: Negative.   Genitourinary: Negative.   Musculoskeletal: Negative.   Skin: Negative.   Allergic/Immunologic: Negative.   Neurological: Negative.   Hematological: Negative.   Psychiatric/Behavioral: Negative.       Today's Vitals   12/24/21 1023  BP: 118/80  Pulse: 74  Temp: 98.3 F (36.8 C)  Weight: 167 lb (75.8 kg)  Height: 5' 3.6" (1.615 m)  PainSc: 0-No pain   Body mass index is 29.03 kg/m.  Wt Readings from Last 3 Encounters:  12/24/21 167 lb (75.8 kg)  10/14/21 170 lb 10.2 oz (77.4 kg)  10/14/21 170 lb 9.6 oz (77.4 kg)     Objective:  Physical Exam Vitals and nursing note reviewed.  Constitutional:      Appearance: Normal appearance.  HENT:     Head: Normocephalic and atraumatic.     Right Ear: Tympanic membrane, ear canal and external ear normal.     Left Ear: Tympanic membrane, ear canal and external ear normal.     Nose: Nose normal.     Mouth/Throat:     Mouth: Mucous membranes are moist.     Pharynx: Oropharynx is clear.  Eyes:     Extraocular Movements: Extraocular movements intact.     Conjunctiva/sclera: Conjunctivae normal.     Pupils: Pupils are equal, round, and reactive to light.  Cardiovascular:     Rate and Rhythm: Normal rate and regular rhythm.     Pulses: Normal pulses.     Heart sounds: Normal heart sounds.  Pulmonary:     Effort: Pulmonary effort is normal.     Breath sounds: Normal breath sounds.  Chest:  Breasts:    Tanner Score is 5.     Right: Normal.     Left: Normal.  Abdominal:     General: Abdomen is flat. Bowel sounds are normal.     Palpations:  Abdomen is soft.  Genitourinary:    Comments: deferred Musculoskeletal:        General: Normal range of motion.     Cervical back: Normal range of motion and neck supple.  Skin:    General: Skin is warm and dry.  Neurological:     General: No focal deficit present.     Mental Status: She is alert and oriented to person, place, and time.  Psychiatric:  Mood and Affect: Mood normal.        Behavior: Behavior normal.         Assessment And Plan:     1. Encounter for general adult medical examination w/o abnormal findings Comments: A full exam was performed. Importance of monthly self breast exams was discussed with the patient. PATIENT IS ADVISED TO GET 30-45 MINUTES REGULAR EXERCISE NO LESS THAN FOUR TO FIVE DAYS PER WEEK - BOTH WEIGHTBEARING EXERCISES AND AEROBIC ARE RECOMMENDED.  PATIENT IS ADVISED TO FOLLOW A HEALTHY DIET WITH AT LEAST SIX FRUITS/VEGGIES PER DAY, DECREASE INTAKE OF RED MEAT, AND TO INCREASE FISH INTAKE TO TWO DAYS PER WEEK.  MEATS/FISH SHOULD NOT BE FRIED, BAKED OR BROILED IS PREFERABLE.  IT IS ALSO IMPORTANT TO CUT BACK ON YOUR SUGAR INTAKE. PLEASE AVOID ANYTHING WITH ADDED SUGAR, CORN SYRUP OR OTHER SWEETENERS. IF YOU MUST USE A SWEETENER, YOU CAN TRY STEVIA. IT IS ALSO IMPORTANT TO AVOID ARTIFICIALLY SWEETENERS AND DIET BEVERAGES. LASTLY, I SUGGEST WEARING SPF 50 SUNSCREEN ON EXPOSED PARTS AND ESPECIALLY WHEN IN THE DIRECT SUNLIGHT FOR AN EXTENDED PERIOD OF TIME.  PLEASE AVOID FAST FOOD RESTAURANTS AND INCREASE YOUR WATER INTAKE.   2. Essential hypertension Comments: Chronic, well controlled. EKG performed, NSR w/o acute changes. She will RTO in 6 months.  - POCT Urinalysis Dipstick (81002) - Microalbumin / Creatinine Urine Ratio - EKG 12-Lead - CBC - BMP8+EGFR - Insulin, random(561)  3. Pure hypercholesterolemia Comments: Chronic, she is currently on atorvastatin 71m daily. She is encouraged to increase fiber intake, avoid fried foods, exercise regularly  and take meds as stated.  4. Abnormal urine - Urine Culture  5. Immunization due - Varicella-zoster vaccine IM (Shingrix)  6. BMI 29.0-29.9,adult Comments: She is encouraged to aim for at least 150 minutes of exercise per week.   Patient was given opportunity to ask questions. Patient verbalized understanding of the plan and was able to repeat key elements of the plan. All questions were answered to their satisfaction.   I, RMaximino Greenland MD, have reviewed all documentation for this visit. The documentation on 12/24/21 for the exam, diagnosis, procedures, and orders are all accurate and complete.   THE PATIENT IS ENCOURAGED TO PRACTICE SOCIAL DISTANCING DUE TO THE COVID-19 PANDEMIC.

## 2021-12-24 NOTE — Patient Instructions (Signed)

## 2021-12-25 LAB — CBC
Hematocrit: 42.9 % (ref 34.0–46.6)
Hemoglobin: 14.1 g/dL (ref 11.1–15.9)
MCH: 29.3 pg (ref 26.6–33.0)
MCHC: 32.9 g/dL (ref 31.5–35.7)
MCV: 89 fL (ref 79–97)
Platelets: 294 10*3/uL (ref 150–450)
RBC: 4.82 x10E6/uL (ref 3.77–5.28)
RDW: 12.8 % (ref 11.7–15.4)
WBC: 6.1 10*3/uL (ref 3.4–10.8)

## 2021-12-25 LAB — BMP8+EGFR
BUN/Creatinine Ratio: 16 (ref 12–28)
BUN: 16 mg/dL (ref 8–27)
CO2: 25 mmol/L (ref 20–29)
Calcium: 9.9 mg/dL (ref 8.7–10.3)
Chloride: 105 mmol/L (ref 96–106)
Creatinine, Ser: 1.01 mg/dL — ABNORMAL HIGH (ref 0.57–1.00)
Glucose: 96 mg/dL (ref 70–99)
Potassium: 4.7 mmol/L (ref 3.5–5.2)
Sodium: 142 mmol/L (ref 134–144)
eGFR: 60 mL/min/{1.73_m2} (ref >59)

## 2021-12-25 LAB — MICROALBUMIN / CREATININE URINE RATIO
Creatinine, Urine: 261.3 mg/dL
Microalb/Creat Ratio: 7 mg/g creat (ref 0–29)
Microalbumin, Urine: 17.5 ug/mL

## 2021-12-25 LAB — INSULIN, RANDOM: INSULIN: 30.4 u[IU]/mL — ABNORMAL HIGH (ref 2.6–24.9)

## 2021-12-26 LAB — URINE CULTURE

## 2022-01-11 ENCOUNTER — Telehealth: Payer: Self-pay

## 2022-01-11 NOTE — Chronic Care Management (AMB) (Signed)
    Chronic Care Management Pharmacy Assistant   Name: Jessica Crosby  MRN: [id number] DOB: [birth year]  Reason for Encounter: Medication Review/ Medication coordination  Recent office visits:  12-24-2021 Glendale Chard, MD. Creatinine= 1.01. Insulin, random= 30.4. Abnormal UA. Shingrix given.  10-14-2021 Glendale Chard, MD. Creatinine= 1.02, eGFR= 59, Albumin/Globulin Ratio= 2.3. CT CARDIAC SCORING ordered. STOP lexapro.  10-14-2021 Kellie Simmering, LPN. Medicare wellness visit.  Recent consult visits:  12-18-2021 Salvadore Dom, MD (OBGYN). Pessary check.  12-15-2021 Juanita Craver, MD [facility]). Colonoscopy completed.   10-16-2021 Salvadore Dom, MD (OBGYN). Pessary check.  Hospital visits:  None in previous 6 months  Medications: Outpatient Encounter Medications as of 01/11/2022  Medication Sig Note   aspirin 81 MG tablet Take 81 mg by mouth daily.    atorvastatin (LIPITOR) 20 MG tablet Take 1 tablet (20 mg total) by mouth daily.    calcium carbonate (OS-CAL) 600 MG TABS Take 600 mg by mouth 2 (two) times daily with a meal.    Cholecalciferol (VITAMIN D PO) Take 2,000 mg by mouth daily.     estradiol (ESTRACE) 0.1 MG/GM vaginal cream Apply one gram vaginally twice weekly.    losartan-hydrochlorothiazide (HYZAAR) 100-12.5 MG tablet Take 1 tablet by mouth daily.    Magnesium 250 MG TABS Take by mouth. 06/04/2015: Received from: [facility]   Multiple Vitamin (THERA) TABS Take 1 tablet by mouth daily. 06/09/2016: Received from: [facility]: Take 1 tablet by mouth daily.   Omega-3 Fatty Acids (FISH OIL) 1000 MG CAPS Take 1 capsule by mouth. 06/04/2015: Received from: [facility]   No facility-administered encounter medications on file as of 01/11/2022.  Reviewed chart for medication changes ahead of medication coordination call.  No hospital visits since last care coordination call/Pharmacist visit.   No medication changes indicated   BP  Readings from Last 3 Encounters:  12/24/21 118/80  12/18/21 128/88  10/16/21 136/82    Lab Results  Component Value Date   HGBA1C 5.3 09/26/2019     Patient obtains medications through [facility]  90 Days   Last adherence delivery included:  Losartan/ HCTZ 100-12.5 mg daily Lexapro 10 mg daily  Patient declined (meds) last month delivery: Atorvastatin 20 mg daily due to dose changing to 20 mg on MWF  Patient is due for next adherence delivery on: 01-21-2022  Called patient and reviewed medications and coordinated delivery.  This delivery to include: Losartan/ HCTZ 100-12.5 mg daily Lexapro 10 mg daily Atorvastatin 20 mg daily   No short/ acute fill needed  Patient declined the following medications (meds): Lexapro- discontinued  Atorvastatin- [facility] supply   Patient needs refills for: None   Confirmed delivery date of 01-21-2022, advised patient that pharmacy will contact them the morning of delivery.   Care Gaps: Colonoscopy overdue  Star Rating Drugs: Losartan/ HCTZ 100-12.5 mg- Last filled 10-16-2021 [facility] Atorvastatin 20 mg- Last filled 07-20-2021 90 DS  [place] Clinical Pharmacist Assistant [phone]

## 2022-01-13 ENCOUNTER — Encounter: Payer: PPO | Admitting: Internal Medicine

## 2022-01-14 ENCOUNTER — Other Ambulatory Visit: Payer: Self-pay

## 2022-01-14 ENCOUNTER — Other Ambulatory Visit: Payer: Self-pay | Admitting: Internal Medicine

## 2022-01-14 MED ORDER — ATORVASTATIN CALCIUM 20 MG PO TABS: ORAL_TABLET | ORAL | 2 refills | Status: DC

## 2022-02-03 DIAGNOSIS — K635 Polyp of colon: Secondary | ICD-10-CM | POA: Diagnosis not present

## 2022-02-03 DIAGNOSIS — D124 Benign neoplasm of descending colon: Secondary | ICD-10-CM | POA: Diagnosis not present

## 2022-02-03 DIAGNOSIS — K5730 Diverticulosis of large intestine without perforation or abscess without bleeding: Secondary | ICD-10-CM | POA: Diagnosis not present

## 2022-02-03 DIAGNOSIS — Z1211 Encounter for screening for malignant neoplasm of colon: Secondary | ICD-10-CM | POA: Diagnosis not present

## 2022-02-12 NOTE — Progress Notes (Signed)
This encounter was created in error - please disregard.

## 2022-03-01 ENCOUNTER — Ambulatory Visit: Payer: Self-pay

## 2022-03-01 NOTE — Patient Outreach (Signed)
  Care Coordination   03/01/2022 Name: Jessica Crosby MRN: [id number] DOB: Jul 13, 1950   Care Coordination Outreach Attempts:  An unsuccessful telephone outreach was attempted for a scheduled appointment today.  Follow Up Plan:  Additional outreach attempts will be made to offer the patient care coordination information and services.   Encounter Outcome:  No Answer  Care Coordination Interventions Activated:  No   Care Coordination Interventions:  No, not indicated    Barb Merino, RN, BSN, CCM Care Management Coordinator [facility] Care Management Direct Phone: [phone]

## 2022-03-15 ENCOUNTER — Ambulatory Visit: Payer: Self-pay

## 2022-03-15 NOTE — Patient Outreach (Addendum)
  Care Coordination   Follow Up Visit Note   03/15/2022 Name: Jessica Crosby MRN: [id number] DOB: 12/13/1950  Jessica Crosby is a 71 y.o. year old female who sees Glendale Chard, MD for primary care. I spoke with  Jessica Crosby by phone today.  What matters to the patients health and wellness today?  Patient would like to get to Yoga three times per week.     Goals Addressed               This Visit's Progress     Patient Stated     I would like to get back to Yoga 3 days a week (pt-stated)        Care Coordination Interventions: Patient interviewed about adult health maintenance status including  Influenza Vaccine Notified provider of adult immunization status and request for consideration of order for/scheduling of Influenza Vaccine Educated patient about the provider referral exercise program, she may consider in the future Provided printed education about Elderly Nutrition 101           SDOH assessments and interventions completed:  No     Care Coordination Interventions Activated:  Yes  Care Coordination Interventions:  Yes, provided   Follow up plan: Follow up call scheduled for 07/02/22 '@09'$ :00 AM     Encounter Outcome:  Pt. Visit Completed

## 2022-03-15 NOTE — Patient Instructions (Addendum)
Visit Information  Thank you for taking time to visit with me today. Please don't hesitate to contact me if I can be of assistance to you.   Following are the goals we discussed today:   Goals Addressed               This Visit's Progress     Patient Stated     I would like to get back to Yoga 3 days a week (pt-stated)        Care Coordination Interventions: Patient interviewed about adult health maintenance status including  Influenza Vaccine Notified provider of adult immunization status and request for consideration of order for/scheduling of Influenza Vaccine Educated patient about the provider referral exercise program, she may consider in the future Provided printed education about Elderly Nutrition 101        Our next appointment is by telephone on 07/02/22 at 09:00 AM   Please call the care guide team at [phone] if you need to cancel or reschedule your appointment.   If you are experiencing a Mental Health or [place] or need someone to talk to, please call 1-800-273-TALK (toll free, 24 hour hotline)  Patient verbalizes understanding of instructions and care plan provided today and agrees to view in [place]. Active MyChart status and patient understanding of how to access instructions and care plan via MyChart confirmed with patient.     Barb Merino, RN, BSN, CCM Care Management Coordinator [facility] Management Direct Phone: [phone]

## 2022-03-24 ENCOUNTER — Encounter: Payer: Self-pay | Admitting: Obstetrics and Gynecology

## 2022-03-24 ENCOUNTER — Ambulatory Visit: Payer: PPO | Admitting: Obstetrics and Gynecology

## 2022-03-24 VITALS — BP 128/64 | HR 65 | Wt 163.0 lb

## 2022-03-24 DIAGNOSIS — Z4689 Encounter for fitting and adjustment of other specified devices: Secondary | ICD-10-CM | POA: Diagnosis not present

## 2022-03-24 DIAGNOSIS — N952 Postmenopausal atrophic vaginitis: Secondary | ICD-10-CM | POA: Diagnosis not present

## 2022-03-24 MED ORDER — ESTRADIOL 0.1 MG/GM VA CREA: TOPICAL_CREAM | VAGINAL | 1 refills | Status: DC

## 2022-03-24 NOTE — Progress Notes (Signed)
GYNECOLOGY  VISIT   HPI: 71 y.o.   Married White or Caucasian Not Hispanic or Latino  female   [phone] with Patient's last menstrual period was 06/21/1998.   here for Pessary follow up. She has a #6 ring pessary with support. She is taking the pessary out 3 days a week and using estrogen cream 2 x a week.  No vaginal bleeding. No bowel or bladder c/o.  Sexually active with the pessary in, no pain.   GYNECOLOGIC HISTORY: Patient's last menstrual period was 06/21/1998. Contraception:pmp Menopausal hormone therapy: estrace         OB History     Gravida  3   Para  3   Term  3   Preterm      AB      Living  3      SAB      IAB      Ectopic      Multiple      Live Births                 Patient Active Problem List   Diagnosis Date Noted   Night sweats 04/13/2020   Primary insomnia 04/13/2020   Situational anxiety 04/13/2020   Vitamin D deficiency disease 04/13/2020   Allergic rhinitis due to other allergen 06/10/2017   Depression 06/10/2017   Pericarditis 06/10/2017   Scarlet fever 06/10/2017   Single skin nodule 06/25/2014   Multiple thyroid nodules 01/08/2014   Essential hypertension 12/25/2013   H/O hyperparathyroidism 12/25/2013   Abdominal pain, chronic, epigastric 05/09/2012   Hypercalcemia 12/30/2008   Family history of malignant neoplasm of breast 03/30/2006   Fibrocystic breast disease 03/30/2006   Enthesopathy of hip region 08/30/2005   Esophageal reflux 12/27/2003   Mixed hyperlipidemia 02/12/2003   Osteopenia 01/26/2000    Past Medical History:  Diagnosis Date   Arthritis    GERD (gastroesophageal reflux disease)    Hx of scarlet fever    as achild   Hypertension    Thyroid disease    hyperparathyroidism   Vitamin D deficiency     Past Surgical History:  Procedure Laterality Date   CHOLECYSTECTOMY  1999   PARATHYROIDECTOMY Right 2010    Current Outpatient Medications  Medication Sig Dispense Refill   aspirin 81 MG tablet  Take 81 mg by mouth daily.     atorvastatin (LIPITOR) 20 MG tablet ONE TAB PO MWF 45 tablet 2   calcium carbonate (OS-CAL) 600 MG TABS Take 600 mg by mouth 2 (two) times daily with a meal.     Cholecalciferol (VITAMIN D PO) Take 2,000 mg by mouth daily.      estradiol (ESTRACE) 0.1 MG/GM vaginal cream Apply one gram vaginally twice weekly. 42.5 g 1   losartan-hydrochlorothiazide (HYZAAR) 100-12.5 MG tablet Take 1 tablet by mouth daily. 90 tablet 2   Magnesium 250 MG TABS Take by mouth.     Multiple Vitamin (THERA) TABS Take 1 tablet by mouth daily.     Omega-3 Fatty Acids (FISH OIL) 1000 MG CAPS Take 1 capsule by mouth.     No current facility-administered medications for this visit.     ALLERGIES: Ace inhibitors  Family History  Problem Relation Age of Onset   Breast cancer Sister 38   Prostate cancer Father 22   Breast cancer Maternal Aunt 81   Prostate cancer Paternal Uncle    Throat cancer Paternal Grandfather        pipe smoker   Breast  cancer Sister 18   Lymphoma Sister 84   Leukemia Sister 31       as a result of chemotherapy from her [place] Sister 66       non hodgins lymphoma/ leukemia   Pancreatic cancer Maternal Aunt 52   Colon cancer Maternal Aunt        diagnosed in her 59s   Thyroid cancer Maternal Aunt        dx in her 27s   Leukemia Maternal Aunt        diagnosed in her 40s   Breast cancer Cousin        diagnosed in her 32s   Prostate cancer Paternal Uncle     Social History   Socioeconomic History   Marital status: Married    Spouse name: Not on file   Number of children: Not on file   Years of education: Not on file   Highest education level: Not on file  Occupational History   Not on file  Tobacco Use   Smoking status: Never   Smokeless tobacco: Never  Vaping Use   Vaping Use: Never used  Substance and Sexual Activity   Alcohol use: No   Drug use: No   Sexual activity: Yes    Partners: Male    Birth control/protection:  Post-menopausal  Other Topics Concern   Not on file  Social History Narrative   Not on file   Social Determinants of Health   Financial Resource Strain: Low Risk  (10/14/2021)   Overall Financial Resource Strain (CARDIA)    Difficulty of Paying Living Expenses: Not hard at all  Food Insecurity: No Food Insecurity (10/14/2021)   Hunger Vital Sign    Worried About Running Out of Food in the Last Year: Never true    Cucumber in the Last Year: Never true  Transportation Needs: No Transportation Needs (10/14/2021)   PRAPARE - Hydrologist (Medical): No    Lack of Transportation (Non-Medical): No  Physical Activity: Sufficiently Active (10/14/2021)   Exercise Vital Sign    Days of Exercise per Week: 7 days    Minutes of Exercise per Session: 60 min  Stress: No Stress Concern Present (10/14/2021)   [place]    Feeling of Stress : Not at all  Social Connections: Not on file  Intimate Partner Violence: Not At Risk (03/22/2019)   Humiliation, Afraid, Rape, and Kick questionnaire    Fear of Current or Ex-Partner: No    Emotionally Abused: No    Physically Abused: No    Sexually Abused: No    Review of Systems  All other systems reviewed and are negative.   PHYSICAL EXAMINATION:    BP 128/64   Pulse 65   Wt 163 lb (73.9 kg)   LMP 06/21/1998   SpO2 100%   BMI 28.33 kg/m     General appearance: alert, cooperative and appears stated age  Pelvic: External genitalia:  no lesions              Urethra:  normal appearing urethra with no masses, tenderness or lesions              Bartholins and Skenes: normal                 Vagina: pessary was removed and cleaned. No vaginal irritation. Pessary was replaced  Cervix: no lesions                Chaperone was present for exam.  1. Pessary maintenance Doing well, no further vaginal irritation.   2. Vaginal atrophy Helped  with estrace cream, will refill.  - estradiol (ESTRACE) 0.1 MG/GM vaginal cream; Apply one gram vaginally twice weekly.  Dispense: 42.5 g; Refill: 1

## 2022-03-25 DIAGNOSIS — H40013 Open angle with borderline findings, low risk, bilateral: Secondary | ICD-10-CM | POA: Diagnosis not present

## 2022-03-31 ENCOUNTER — Other Ambulatory Visit: Payer: Self-pay | Admitting: Endocrinology

## 2022-03-31 DIAGNOSIS — E041 Nontoxic single thyroid nodule: Secondary | ICD-10-CM | POA: Diagnosis not present

## 2022-03-31 DIAGNOSIS — Z23 Encounter for immunization: Secondary | ICD-10-CM | POA: Diagnosis not present

## 2022-04-02 ENCOUNTER — Ambulatory Visit
Admission: RE | Admit: 2022-04-02 | Discharge: 2022-04-02 | Disposition: A | Discharge status: Home / Self Care | Payer: PPO | Source: Ambulatory Visit | Attending: Endocrinology | Admitting: Endocrinology

## 2022-04-02 DIAGNOSIS — E041 Nontoxic single thyroid nodule: Secondary | ICD-10-CM

## 2022-04-09 ENCOUNTER — Telehealth: Payer: Self-pay

## 2022-04-09 NOTE — Chronic Care Management (AMB) (Signed)
Chronic Care Management Pharmacy Assistant   Name: Jessica Crosby  MRN: [id number] DOB: February 03, 1951  Reason for Encounter: Medication Review/ Medication Coordination    Recent office visits:  03/15/2022- Barb Merino, RN (CCM)  Recent consult visits:  03/31/2022- Dr. Jacelyn Pi (Endocrinologist)- No medication changes noted.  03/24/2022- Dr. Sumner Boast (Gynecology)- No medication changes noted.  02/03/2022- Dr Juanita Craver (Gastroenterologist)- Colonoscopy and Endoscopy performed. No medication changes noted.    Hospital visits:  None in previous 6 months  Medications: Outpatient Encounter Medications as of 04/09/2022  Medication Sig Note   aspirin 81 MG tablet Take 81 mg by mouth daily.    atorvastatin (LIPITOR) 20 MG tablet ONE TAB PO MWF    calcium carbonate (OS-CAL) 600 MG TABS Take 600 mg by mouth 2 (two) times daily with a meal.    Cholecalciferol (VITAMIN D PO) Take 2,000 mg by mouth daily.     estradiol (ESTRACE) 0.1 MG/GM vaginal cream Apply one gram vaginally twice weekly.    losartan-hydrochlorothiazide (HYZAAR) 100-12.5 MG tablet Take 1 tablet by mouth daily.    Magnesium 250 MG TABS Take by mouth. 06/04/2015: Received from: [place]   Multiple Vitamin (THERA) TABS Take 1 tablet by mouth daily. 06/09/2016: Received from: [place]: Take 1 tablet by mouth daily.   Omega-3 Fatty Acids (FISH OIL) 1000 MG CAPS Take 1 capsule by mouth. 06/04/2015: Received from: [place]   No facility-administered encounter medications on file as of 04/09/2022.   Reviewed chart for medication changes ahead of medication coordination call.  No medication changes indicated.  BP Readings from Last 3 Encounters:  03/24/22 128/64  12/24/21 118/80  12/18/21 128/88    Lab Results  Component Value Date   HGBA1C 5.3 09/26/2019     Patient obtains medications through Vials  90 Days   Last adherence delivery included:  Losartan/ HCTZ 100-12.5 mg  daily  Patient declined the following medications last coordination call: Lexapro- discontinued  Atorvastatin- Plenty supply   Patient is due for next adherence delivery on: 04/21/2022. Called patient and reviewed medications and coordinated delivery.  This delivery to include: Losartan/ HCTZ 100-12.5 mg daily  No short fill or acute fill needed.   Patient aware estradiol cream not due until 06/27/22  Patient declined the following medications: Atorvastatin 20 mg- 1 tab on Mon, Wed, and Fri- Patient states she still has 2 full bottles of medication left. Patient notified that due to last refill was completed 04/21/2021 medication will due to expire 04/21/2022. Patient acknowledges and verified that both bottles will be expiring next month. Patient wants to discuss with PCP on if she should continue medication prior to filling again. Patient aware that I will send a message to both Dr Baird Cancer and Orlando Penner, Pharm D to see if patient should continue taking Atorvastatin? Should patient have labs checked again to see if she needs to continue taking Atorvastatin? And if patient continues medication after expiration date will it be less effective? Patient aware I will contact her back with answers.  Patient needs refills for: None.  Confirmed delivery date of 04/21/2022, advised patient that pharmacy will contact them the morning of delivery.    Care Gaps: COVID-19 Vaccine Overdue since 09/28/2019 (Dose 3 - Pfizer series) Previous Completions: 08/03/2019- Imm Admin: PFIZER(Purple Top)SARS-COV-2 Vaccination  07/13/2019- Imm Admin: PFIZER(Purple Top)SARS-COV-2 Vaccination   Mammogram completed 08/24/2021 Colonoscopy completed 02/03/2022 AWV completed 10/14/2021   Star Rating Drugs: Losartan HCTZ 100/12.5 mg LF 01/14/2022  90DS Atorvastatin 20 mg- LF 04/21/2021 (dose was decreased early 2023)   [name], [place] Pharmacist Assistant [phone]

## 2022-06-30 ENCOUNTER — Ambulatory Visit: Payer: PPO | Admitting: Internal Medicine

## 2022-06-30 ENCOUNTER — Ambulatory Visit: Payer: PPO | Admitting: Obstetrics and Gynecology

## 2022-07-01 ENCOUNTER — Other Ambulatory Visit: Payer: Self-pay

## 2022-07-01 ENCOUNTER — Encounter: Payer: Self-pay | Admitting: Internal Medicine

## 2022-07-01 ENCOUNTER — Telehealth (INDEPENDENT_AMBULATORY_CARE_PROVIDER_SITE_OTHER): Payer: PPO | Admitting: Internal Medicine

## 2022-07-01 VITALS — Temp 99.9°F

## 2022-07-01 DIAGNOSIS — U071 COVID-19: Secondary | ICD-10-CM

## 2022-07-01 MED ORDER — NIRMATRELVIR/RITONAVIR (PAXLOVID)TABLET: 3.0000 | ORAL_TABLET | Freq: Two times a day (BID) | ORAL | 0 refills | Status: DC

## 2022-07-01 MED ORDER — NIRMATRELVIR/RITONAVIR (PAXLOVID)TABLET: 3.0000 | ORAL_TABLET | Freq: Two times a day (BID) | ORAL | 0 refills | Status: AC

## 2022-07-01 NOTE — Patient Instructions (Signed)

## 2022-07-01 NOTE — Progress Notes (Signed)
Virtual Visit via Video   This visit type was conducted due to national recommendations for restrictions regarding the COVID-19 Pandemic (e.g. social distancing) in an effort to limit this patient's exposure and mitigate transmission in our community.  Due to her co-morbid illnesses, this patient is at least at moderate risk for complications without adequate follow up.  This format is felt to be most appropriate for this patient at this time.  All issues noted in this document were discussed and addressed.  A limited physical exam was performed with this format.    This visit type was conducted due to national recommendations for restrictions regarding the COVID-19 Pandemic (e.g. social distancing) in an effort to limit this patient's exposure and mitigate transmission in our community.  Patients identity confirmed using two different identifiers.  This format is felt to be most appropriate for this patient at this time.  All issues noted in this document were discussed and addressed.  No physical exam was performed (except for noted visual exam findings with Video Visits).    Date:  07/01/2022   ID:  Jessica Crosby, DOB 26-Jun-1950, MRN [id number]  Patient Location:  Home  Provider location:   Office    Chief Complaint:  "I have COVID"  History of Present Illness:    Jessica Crosby is a 72 y.o. female who presents via video conferencing for a telehealth visit today.    The patient does have symptoms concerning for COVID-19 infection (fever, chills, cough, or new shortness of breath).   She presents today for virtual visit. She prefers this method of contact due to COVID-19 pandemic. She states she started to have sore throat, later developed body aches and chills. Also w/ headache. She states her granddaughter went to [place] concert in [place] and then spent the weekend with her.  Granddaughter was diagnosed on Tuesday with [place].        Past Medical History:   Diagnosis Date   Arthritis    GERD (gastroesophageal reflux disease)    Hx of scarlet fever    as achild   Hypertension    Thyroid disease    hyperparathyroidism   Vitamin D deficiency    Past Surgical History:  Procedure Laterality Date   CHOLECYSTECTOMY  1999   PARATHYROIDECTOMY Right 2010     Current Meds  Medication Sig   aspirin 81 MG tablet Take 81 mg by mouth daily.   atorvastatin (LIPITOR) 20 MG tablet ONE TAB PO MWF   calcium carbonate (OS-CAL) 600 MG TABS Take 600 mg by mouth 2 (two) times daily with a meal.   Cholecalciferol (VITAMIN D PO) Take 2,000 mg by mouth daily.    estradiol (ESTRACE) 0.1 MG/GM vaginal cream Apply one gram vaginally twice weekly.   losartan-hydrochlorothiazide (HYZAAR) 100-12.5 MG tablet Take 1 tablet by mouth daily.   Magnesium 250 MG TABS Take by mouth.   Multiple Vitamin (THERA) TABS Take 1 tablet by mouth daily.   Omega-3 Fatty Acids (FISH OIL) 1000 MG CAPS Take 1 capsule by mouth.     Allergies:   Ace inhibitors   Social History   Tobacco Use   Smoking status: Never   Smokeless tobacco: Never  Vaping Use   Vaping Use: Never used  Substance Use Topics   Alcohol use: No   Drug use: No     Family Hx: The patient's family history includes Breast cancer in her cousin; Breast cancer (age of onset: 25) in her sister; Breast cancer (  age of onset: 74) in her sister; Breast cancer (age of onset: 69) in her maternal aunt; Cancer (age of onset: 40) in her sister; Colon cancer in her maternal aunt; Leukemia in her maternal aunt; Leukemia (age of onset: 68) in her sister; Lymphoma (age of onset: 47) in her sister; Pancreatic cancer (age of onset: 69) in her maternal aunt; Prostate cancer in her paternal uncle and paternal uncle; Prostate cancer (age of onset: 36) in her father; Throat cancer in her paternal grandfather; Thyroid cancer in her maternal aunt.  ROS:   Please see the history of present illness.    Review of Systems   Constitutional: Negative.   Respiratory:  Positive for cough. Negative for shortness of breath.   Cardiovascular: Negative.   Gastrointestinal: Negative.   Neurological:  Positive for headaches.  Psychiatric/Behavioral: Negative.      All other systems reviewed and are negative.   Labs/Other Tests and Data Reviewed:    Recent Labs: 10/14/2021: ALT 32 12/24/2021: BUN 16; Creatinine, Ser 1.01; Hemoglobin 14.1; Platelets 294; Potassium 4.7; Sodium 142   Recent Lipid Panel Lab Results  Component Value Date/Time   CHOL 169 10/14/2021 10:48 AM   TRIG 149 10/14/2021 10:48 AM   HDL 72 10/14/2021 10:48 AM   CHOLHDL 2.3 10/14/2021 10:48 AM   LDLCALC 72 10/14/2021 10:48 AM    Wt Readings from Last 3 Encounters:  03/24/22 163 lb (73.9 kg)  12/24/21 167 lb (75.8 kg)  10/14/21 170 lb 10.2 oz (77.4 kg)     Exam:    Vital Signs:  Temp 99.9 F (37.7 C)   LMP 06/21/1998     Physical Exam Vitals and nursing note reviewed.  Constitutional:      General: She is in acute distress.     Comments: Sounds congested  HENT:     Head: Normocephalic and atraumatic.  Pulmonary:     Effort: Pulmonary effort is normal.  Musculoskeletal:     Cervical back: Normal range of motion.  Neurological:     Mental Status: She is alert and oriented to person, place, and time.  Psychiatric:        Mood and Affect: Affect normal.     ASSESSMENT & PLAN:    1. COVID-19 - Temperature monitoring; Future She wishes to get antiviral treatment. I will send rx Paxlovid to the pharmacy.  SHE IS AWARE THAT SHE MUST STOP STATIN THERAPY DURING PAXLOVID DOSING, MAY RESUME 63 HOURS AFTER PAXLOVID COMPLETION.  I will also refer her for home monitoring/temperature monitoring program. She is encouraged to email me daily on mychart to let me know how she is doing. She is encouraged to go to ER should she develop worsening SOB. Advised patient to take Vitamin C, D, Zinc.  Keep yourself hydrated with a lot of water and  rest. Take Delsym for cough and Mucinex as needed. Take Tylenol or pain reliever every 4-6 hours as needed for pain/fever/body ache. If you have elevated blood pressure, you can take OTC Coricidin. You can also take OTC Oscillococcinum, a homeopathic remedy,  to help with your symptoms.  Educated patient that if symptoms get worse or if he/she experiences any SOB, chest pain or pain in their legs to seek immediate emergency care. Continue to monitor your oxygen levels. Call office ASAP if you have any questions. Quarantine for 5 days.  Wear a mask around other people for five days after quarantine has passed.    She verbalizes understanding of her treatment plan.  All questions were answered to her satisfaction. She understands that she needs to continue to self quarantine    COVID-19 Education: The signs and symptoms of COVID-19 were discussed with the patient and how to seek care for testing (follow up with PCP or arrange E-visit).  The importance of social distancing was discussed today.  Patient Risk:   After full review of this patients clinical status, I feel that they are at least moderate risk at this time.  Time:   Today, I have spent 12 minutes/ seconds with the patient with telehealth technology discussing above diagnoses.     Medication Adjustments/Labs and Tests Ordered: Current medicines are reviewed at length with the patient today.  Concerns regarding medicines are outlined above.   Tests Ordered: No orders of the defined types were placed in this encounter.   Medication Changes: No orders of the defined types were placed in this encounter.   Disposition:  Follow up prn  Signed, Maximino Greenland, MD

## 2022-07-02 ENCOUNTER — Ambulatory Visit: Payer: Self-pay

## 2022-07-02 NOTE — Patient Instructions (Signed)
Visit Information  Thank you for taking time to visit with me today. Please don't hesitate to contact me if I can be of assistance to you.   Following are the goals we discussed today:   Goals Addressed             This Visit's Progress    To recover from [place] Interventions: Provided education to patient to enhance basic understanding of COVID-19 as a viral disease, measures to prevent exposure, signs and symptoms, recommended vaccine schedule, when to contact provider Educated patient on importance of staying well hydrated and taking her Paxlovid as directed Instructed patient to contact her PCP with new symptoms or concerns            Our next appointment is by telephone on 07/16/22 at 12:30 PM  Please call the care guide team at [phone] if you need to cancel or reschedule your appointment.   If you are experiencing a Mental Health or [place] or need someone to talk to, please go to [facility] [address] [place] [phone])  Patient verbalizes understanding of instructions and care plan provided today and agrees to view in [place]. Active MyChart status and patient understanding of how to access instructions and care plan via MyChart confirmed with patient.     Barb Merino, RN, BSN, CCM Care Management Coordinator [facility] Care Management Direct Phone: [phone]

## 2022-07-02 NOTE — Patient Outreach (Signed)
  Care Coordination   Follow Up Visit Note   07/02/2022 Name: Jessica Crosby MRN: [id number] DOB: 1950/09/21  Jessica Crosby is a 73 y.o. year old female who sees Glendale Chard, MD for primary care. I spoke with  Jessica Crosby by phone today.  What matters to the patients health and wellness today?  Patient will start the Paxlovid today for treatment of COVID 19.     Goals Addressed             This Visit's Progress    To recover from [place] Interventions: Provided education to patient to enhance basic understanding of COVID-19 as a viral disease, measures to prevent exposure, signs and symptoms, recommended vaccine schedule, when to contact provider Educated patient on importance of staying well hydrated and taking her Paxlovid as directed Instructed patient to contact her PCP with new symptoms or concerns            SDOH assessments and interventions completed:  No     Care Coordination Interventions:  Yes, provided   Follow up plan: Follow up call scheduled for 07/16/22 '@12'$ :30 PM    Encounter Outcome:  Pt. Visit Completed

## 2022-07-05 ENCOUNTER — Ambulatory Visit: Payer: PPO | Admitting: Internal Medicine

## 2022-07-07 ENCOUNTER — Ambulatory Visit: Payer: PPO | Admitting: Obstetrics and Gynecology

## 2022-07-09 ENCOUNTER — Telehealth: Payer: Self-pay

## 2022-07-09 NOTE — Progress Notes (Signed)
Care Management & Coordination Services Pharmacy Team  Reason for Encounter: Medication coordination and delivery  Contacted patient on 07/09/2022 to discuss medications   Recent office visits:  07-02-2022 Little, Claudette Stapler, RN (CCM).  07-01-2022 Glendale Chard, MD. Visit for covid. START paxlovid.  Recent consult visits:  None  Hospital visits:  None in previous 6 months  Medications: Outpatient Encounter Medications as of 07/09/2022  Medication Sig Note   aspirin 81 MG tablet Take 81 mg by mouth daily.    atorvastatin (LIPITOR) 20 MG tablet ONE TAB PO MWF    calcium carbonate (OS-CAL) 600 MG TABS Take 600 mg by mouth 2 (two) times daily with a meal.    Cholecalciferol (VITAMIN D PO) Take 2,000 mg by mouth daily.     estradiol (ESTRACE) 0.1 MG/GM vaginal cream Apply one gram vaginally twice weekly.    guaiFENesin (MUCINEX) 600 MG 12 hr tablet Take 600 mg by mouth 2 (two) times daily.    losartan-hydrochlorothiazide (HYZAAR) 100-12.5 MG tablet Take 1 tablet by mouth daily.    Magnesium 250 MG TABS Take by mouth. 06/04/2015: Received from: [place]   Multiple Vitamin (THERA) TABS Take 1 tablet by mouth daily. 06/09/2016: Received from: [place]: Take 1 tablet by mouth daily.   Omega-3 Fatty Acids (FISH OIL) 1000 MG CAPS Take 1 capsule by mouth. 06/04/2015: Received from: [place]   No facility-administered encounter medications on file as of 07/09/2022.   BP Readings from Last 3 Encounters:  03/24/22 128/64  12/24/21 118/80  12/18/21 128/88    Pulse Readings from Last 3 Encounters:  03/24/22 65  12/24/21 74  12/18/21 72    Lab Results  Component Value Date/Time   HGBA1C 5.3 09/26/2019 12:00 PM   HGBA1C 5.5 03/28/2019 12:00 PM   Lab Results  Component Value Date   CREATININE 1.01 (H) 12/24/2021   BUN 16 12/24/2021   GFRNONAA 60 04/08/2020   GFRAA 69 04/08/2020   NA 142 12/24/2021   K 4.7 12/24/2021   CALCIUM 9.9 12/24/2021   CO2 25  12/24/2021     Last adherence delivery date: 04-21-2022  Patient is due for next adherence delivery on:  07-21-2022  Spoke with patient on 07-09-2022 reviewed medications and coordinated delivery.  This delivery to include: Vials  90 Days  Losartan/ HCTZ 100-12.5 mg daily  Atorvastatin 20 mg MWF Estradiol cream 0.01% apply one gram vaginally twice weekly  Patient declined the following medications this month: Estradiol cream- Plenty supply  Refills requested from providers include: Losartan  Confirmed delivery date of 07-21-2022, advised patient that pharmacy will contact them the morning of delivery.   Any concerns about your medications? No  How often do you forget or accidentally miss a dose? Never  Do you use a pillbox? No  Is patient in packaging No  Any concerns or issues with your packaging? No   Recent blood pressure readings are as follows: 128/64 03-24-2022  Recent blood glucose readings are as follows: 5.3 09-26-2019   Cycle dispensing form sent to [name] for review.   Jessica Crosby

## 2022-07-12 ENCOUNTER — Other Ambulatory Visit: Payer: Self-pay | Admitting: Otolaryngology

## 2022-07-12 ENCOUNTER — Other Ambulatory Visit: Payer: Self-pay

## 2022-07-12 DIAGNOSIS — Z1231 Encounter for screening mammogram for malignant neoplasm of breast: Secondary | ICD-10-CM

## 2022-07-12 MED ORDER — LOSARTAN POTASSIUM-HCTZ 100-12.5 MG PO TABS: 1.0000 | ORAL_TABLET | Freq: Every day | ORAL | 2 refills | Status: DC

## 2022-07-16 ENCOUNTER — Ambulatory Visit: Payer: Self-pay

## 2022-07-16 NOTE — Patient Instructions (Signed)
Visit Information  Thank you for taking time to visit with me today. Please don't hesitate to contact me if I can be of assistance to you.   Following are the goals we discussed today:   Goals Addressed             This Visit's Progress    COMPLETED: To recover from [place] Interventions: Evaluation of current treatment plan related to COVID 19 virus and patient's adherence to plan as established by provider Determined patient feels she has made a full recovery from [place] 19, although she continues to have some fatigue Discussed with patient that she has resumed her normal activities and is slowly reintroducing her Yoga           Our next appointment is by telephone on 10/29/22 at 09:30 AM  Please call the care guide team at [phone] if you need to cancel or reschedule your appointment.   If you are experiencing a Mental Health or [place] or need someone to talk to, please go to [facility] [address], [place] [phone])  Patient verbalizes understanding of instructions and care plan provided today and agrees to view in [place]. Active MyChart status and patient understanding of how to access instructions and care plan via MyChart confirmed with patient.     Barb Merino, RN, BSN, CCM Care Management Coordinator [facility] Care Management Direct Phone: [phone]

## 2022-07-16 NOTE — Patient Outreach (Signed)
  Care Coordination   Follow Up Visit Note   07/16/2022 Name: MANDOLIN FALWELL MRN: [id number] DOB: [birth year]  KENNLEY SCHWANDT is a 72 y.o. year old female who sees Glendale Chard, MD for primary care. I spoke with  Iran Sizer by phone today.  What matters to the patients health and wellness today?  Patient will continue to fully resume her Yoga routine following a recent COVID 19 virus.     Goals Addressed             This Visit's Progress    COMPLETED: To recover from [place] Interventions: Evaluation of current treatment plan related to COVID 19 virus and patient's adherence to plan as established by provider Determined patient feels she has made a full recovery from [place] 19, although she continues to have some fatigue Discussed with patient that she has resumed her normal activities and is slowly reintroducing her Yoga           SDOH assessments and interventions completed:  No     Care Coordination Interventions:  Yes, provided   Follow up plan: Follow up call scheduled for 10/29/22 '@09'$ :30 AM    Encounter Outcome:  Pt. Visit Completed

## 2022-08-18 ENCOUNTER — Ambulatory Visit: Payer: PPO | Admitting: Obstetrics and Gynecology

## 2022-08-20 ENCOUNTER — Encounter: Payer: Self-pay | Admitting: Internal Medicine

## 2022-09-01 ENCOUNTER — Ambulatory Visit: Payer: PPO

## 2022-09-08 ENCOUNTER — Ambulatory Visit: Payer: PPO | Admitting: Obstetrics and Gynecology

## 2022-09-08 ENCOUNTER — Encounter: Payer: Self-pay | Admitting: Obstetrics and Gynecology

## 2022-09-08 VITALS — BP 128/72 | HR 100 | Wt 166.0 lb

## 2022-09-08 DIAGNOSIS — Z4689 Encounter for fitting and adjustment of other specified devices: Secondary | ICD-10-CM

## 2022-09-08 DIAGNOSIS — N952 Postmenopausal atrophic vaginitis: Secondary | ICD-10-CM

## 2022-09-08 DIAGNOSIS — T8389XA Other specified complication of genitourinary prosthetic devices, implants and grafts, initial encounter: Secondary | ICD-10-CM

## 2022-09-08 MED ORDER — ESTRADIOL 0.1 MG/GM VA CREA: TOPICAL_CREAM | VAGINAL | 1 refills | Status: DC

## 2022-09-08 NOTE — Patient Instructions (Signed)
Use 1 gram vaginally every night for one week, then every other night until your appointment.

## 2022-09-08 NOTE — Progress Notes (Signed)
GYNECOLOGY  VISIT   HPI: 72 y.o.   Married White or Caucasian Not Hispanic or Latino  female   [phone] with Patient's last menstrual period was 06/21/1998.   here for Pessary Maintenance. She has a #6 ring pessary with support. She is taking the pessary out about 1 x a week overnight, occasionally longer. Using estrogen cream 2 x a week.  No vaginal bleeding. No bowel or bladder c/o.  Sexually active with the pessary in, no pain.   GYNECOLOGIC HISTORY: Patient's last menstrual period was 06/21/1998. Contraception:pmp Menopausal hormone therapy: estrace        OB History     Gravida  3   Para  3   Term  3   Preterm      AB      Living  3      SAB      IAB      Ectopic      Multiple      Live Births                 Patient Active Problem List   Diagnosis Date Noted   Night sweats 04/13/2020   Primary insomnia 04/13/2020   Situational anxiety 04/13/2020   Vitamin D deficiency disease 04/13/2020   Allergic rhinitis due to other allergen 06/10/2017   Depression 06/10/2017   Pericarditis 06/10/2017   Scarlet fever 06/10/2017   Single skin nodule 06/25/2014   Multiple thyroid nodules 01/08/2014   Essential hypertension 12/25/2013   H/O hyperparathyroidism 12/25/2013   Abdominal pain, chronic, epigastric 05/09/2012   Hypercalcemia 12/30/2008   Family history of malignant neoplasm of breast 03/30/2006   Fibrocystic breast disease 03/30/2006   Enthesopathy of hip region 08/30/2005   Esophageal reflux 12/27/2003   Mixed hyperlipidemia 02/12/2003   Osteopenia 01/26/2000    Past Medical History:  Diagnosis Date   Arthritis    GERD (gastroesophageal reflux disease)    Hx of scarlet fever    as achild   Hypertension    Thyroid disease    hyperparathyroidism   Vitamin D deficiency     Past Surgical History:  Procedure Laterality Date   CHOLECYSTECTOMY  1999   PARATHYROIDECTOMY Right 2010    Current Outpatient Medications  Medication Sig Dispense  Refill   aspirin 81 MG tablet Take 81 mg by mouth daily.     atorvastatin (LIPITOR) 20 MG tablet ONE TAB PO MWF 45 tablet 2   calcium carbonate (OS-CAL) 600 MG TABS Take 600 mg by mouth 2 (two) times daily with a meal.     Cholecalciferol (VITAMIN D PO) Take 2,000 mg by mouth daily.      estradiol (ESTRACE) 0.1 MG/GM vaginal cream Apply one gram vaginally twice weekly. 42.5 g 1   guaiFENesin (MUCINEX) 600 MG 12 hr tablet Take 600 mg by mouth 2 (two) times daily.     losartan-hydrochlorothiazide (HYZAAR) 100-12.5 MG tablet Take 1 tablet by mouth daily. 90 tablet 2   Magnesium 250 MG TABS Take by mouth.     Multiple Vitamin (THERA) TABS Take 1 tablet by mouth daily.     Omega-3 Fatty Acids (FISH OIL) 1000 MG CAPS Take 1 capsule by mouth.     No current facility-administered medications for this visit.     ALLERGIES: Ace inhibitors  Family History  Problem Relation Age of Onset   Breast cancer Sister 57   Prostate cancer Father 43   Breast cancer Maternal Aunt 81   Prostate cancer  Paternal Uncle    Throat cancer Paternal Grandfather        pipe smoker   Breast cancer Sister 52   Lymphoma Sister 20   Leukemia Sister 8       as a result of chemotherapy from her [place] Sister 75       non hodgins lymphoma/ leukemia   Pancreatic cancer Maternal Aunt 3   Colon cancer Maternal Aunt        diagnosed in her 27s   Thyroid cancer Maternal Aunt        dx in her 67s   Leukemia Maternal Aunt        diagnosed in her 42s   Breast cancer Cousin        diagnosed in her 33s   Prostate cancer Paternal Uncle     Social History   Socioeconomic History   Marital status: Married    Spouse name: Not on file   Number of children: Not on file   Years of education: Not on file   Highest education level: Not on file  Occupational History   Not on file  Tobacco Use   Smoking status: Never   Smokeless tobacco: Never  Vaping Use   Vaping Use: Never used  Substance and Sexual  Activity   Alcohol use: No   Drug use: No   Sexual activity: Yes    Partners: Male    Birth control/protection: Post-menopausal  Other Topics Concern   Not on file  Social History Narrative   Not on file   Social Determinants of Health   Financial Resource Strain: Low Risk  (10/14/2021)   Overall Financial Resource Strain (CARDIA)    Difficulty of Paying Living Expenses: Not hard at all  Food Insecurity: No Food Insecurity (10/14/2021)   Hunger Vital Sign    Worried About Running Out of Food in the Last Year: Never true    [place] in the Last Year: Never true  Transportation Needs: No Transportation Needs (10/14/2021)   PRAPARE - Hydrologist (Medical): No    Lack of Transportation (Non-Medical): No  Physical Activity: Sufficiently Active (10/14/2021)   Exercise Vital Sign    Days of Exercise per Week: 7 days    Minutes of Exercise per Session: 60 min  Stress: No Stress Concern Present (10/14/2021)   [place]    Feeling of Stress : Not at all  Social Connections: Not on file  Intimate Partner Violence: Not At Risk (03/22/2019)   Humiliation, Afraid, Rape, and Kick questionnaire    Fear of Current or Ex-Partner: No    Emotionally Abused: No    Physically Abused: No    Sexually Abused: No    Review of Systems  All other systems reviewed and are negative.   PHYSICAL EXAMINATION:    BP 128/72   Pulse 100   Wt 166 lb (75.3 kg)   LMP 06/21/1998   SpO2 100%   BMI 28.85 kg/m     General appearance: alert, cooperative and appears stated age  Pelvic: External genitalia:  no lesions              Urethra:  normal appearing urethra with no masses, tenderness or lesions              Bartholins and Skenes: normal  Vagina: the pessary was removed and cleaned. There a 3 small superficial ulcerations in the posterior vaginal fornix. No other areas of of irritation.  The pessary was left out.              Cervix: no lesions  Chaperone was present for exam.  1. Pessary maintenance The patient is doing well with the pessary, but has developed some irritation.   2. Vaginal irritation from pessary [facility]) Pessary left out -she will increase her vaginal estrogen to nightly for the next week, then change to qod.  -F/U on 09/21/22 -We discussed that she is probably going to go back to taking the pessary out overnight 2 x a week (the nights she uses the estrace).   3. Vaginal atrophy - estradiol (ESTRACE) 0.1 MG/GM vaginal cream; Apply one gram vaginally twice weekly.  Dispense: 42.5 g; Refill: 1

## 2022-09-21 ENCOUNTER — Ambulatory Visit: Payer: PPO | Admitting: Obstetrics and Gynecology

## 2022-09-22 ENCOUNTER — Ambulatory Visit: Payer: PPO | Admitting: Obstetrics and Gynecology

## 2022-09-27 DIAGNOSIS — H40053 Ocular hypertension, bilateral: Secondary | ICD-10-CM | POA: Diagnosis not present

## 2022-09-27 DIAGNOSIS — H40013 Open angle with borderline findings, low risk, bilateral: Secondary | ICD-10-CM | POA: Diagnosis not present

## 2022-10-06 ENCOUNTER — Ambulatory Visit (INDEPENDENT_AMBULATORY_CARE_PROVIDER_SITE_OTHER): Payer: PPO | Admitting: Obstetrics and Gynecology

## 2022-10-06 ENCOUNTER — Encounter: Payer: Self-pay | Admitting: Obstetrics and Gynecology

## 2022-10-06 VITALS — BP 128/64 | HR 74 | Wt 165.0 lb

## 2022-10-06 DIAGNOSIS — T8389XA Other specified complication of genitourinary prosthetic devices, implants and grafts, initial encounter: Secondary | ICD-10-CM

## 2022-10-06 DIAGNOSIS — K641 Second degree hemorrhoids: Secondary | ICD-10-CM | POA: Insufficient documentation

## 2022-10-06 DIAGNOSIS — Z4689 Encounter for fitting and adjustment of other specified devices: Secondary | ICD-10-CM

## 2022-10-06 DIAGNOSIS — E669 Obesity, unspecified: Secondary | ICD-10-CM | POA: Insufficient documentation

## 2022-10-06 DIAGNOSIS — Z1211 Encounter for screening for malignant neoplasm of colon: Secondary | ICD-10-CM | POA: Insufficient documentation

## 2022-10-06 NOTE — Progress Notes (Signed)
GYNECOLOGY  VISIT   HPI: 72 y.o.   Married White or Caucasian Not Hispanic or Latino  female   [phone] with Patient's last menstrual period was 06/21/1998.   here for a pessary check. She has a #6 ring pessary with support. At her visit last month she was noted to have vaginal irritation. The pessary was left out and her vaginal estrogen was increased to daily for a week, then every other day.  She is able to remove and reinsert the pessary. Prior to the last visit she was taking the pessary out ~1 night a week, left it out overnight.   She has had the pessary out and has done okay. She has only been using the pessary during intercourse in the last few weeks.   No c/o.  GYNECOLOGIC HISTORY: Patient's last menstrual period was 06/21/1998. Contraception:na Menopausal hormone therapy: Estrace         OB History     Gravida  3   Para  3   Term  3   Preterm      AB      Living  3      SAB      IAB      Ectopic      Multiple      Live Births                 Patient Active Problem List   Diagnosis Date Noted   Night sweats 04/13/2020   Primary insomnia 04/13/2020   Situational anxiety 04/13/2020   Vitamin D deficiency disease 04/13/2020   Allergic rhinitis due to other allergen 06/10/2017   Depression 06/10/2017   Pericarditis 06/10/2017   Scarlet fever 06/10/2017   Single skin nodule 06/25/2014   Multiple thyroid nodules 01/08/2014   Essential hypertension 12/25/2013   H/O hyperparathyroidism 12/25/2013   Abdominal pain, chronic, epigastric 05/09/2012   Hypercalcemia 12/30/2008   Family history of malignant neoplasm of breast 03/30/2006   Fibrocystic breast disease 03/30/2006   Enthesopathy of hip region 08/30/2005   Esophageal reflux 12/27/2003   Mixed hyperlipidemia 02/12/2003   Osteopenia 01/26/2000    Past Medical History:  Diagnosis Date   Arthritis    GERD (gastroesophageal reflux disease)    Hx of scarlet fever    as achild   Hypertension     Thyroid disease    hyperparathyroidism   Vitamin D deficiency     Past Surgical History:  Procedure Laterality Date   CHOLECYSTECTOMY  1999   PARATHYROIDECTOMY Right 2010    Current Outpatient Medications  Medication Sig Dispense Refill   aspirin 81 MG tablet Take 81 mg by mouth daily.     atorvastatin (LIPITOR) 20 MG tablet ONE TAB PO MWF 45 tablet 2   calcium carbonate (OS-CAL) 600 MG TABS Take 600 mg by mouth 2 (two) times daily with a meal.     Cholecalciferol (VITAMIN D PO) Take 2,000 mg by mouth daily.      estradiol (ESTRACE) 0.1 MG/GM vaginal cream Apply one gram vaginally twice weekly. 42.5 g 1   guaiFENesin (MUCINEX) 600 MG 12 hr tablet Take 600 mg by mouth 2 (two) times daily.     losartan-hydrochlorothiazide (HYZAAR) 100-12.5 MG tablet Take 1 tablet by mouth daily. 90 tablet 2   Magnesium 250 MG TABS Take by mouth.     Multiple Vitamin (THERA) TABS Take 1 tablet by mouth daily.     Omega-3 Fatty Acids (FISH OIL) 1000 MG CAPS Take 1  capsule by mouth.     No current facility-administered medications for this visit.     ALLERGIES: Ace inhibitors  Family History  Problem Relation Age of Onset   Breast cancer Sister 35   Prostate cancer Father 84   Breast cancer Maternal Aunt 81   Prostate cancer Paternal Uncle    Throat cancer Paternal Grandfather        pipe smoker   Breast cancer Sister 47   Lymphoma Sister 78   Leukemia Sister 30       as a result of chemotherapy from her lymphoma   Cancer Sister 91       non hodgins lymphoma/ leukemia   Pancreatic cancer Maternal Aunt 19   Colon cancer Maternal Aunt        diagnosed in her 73s   Thyroid cancer Maternal Aunt        dx in her 6s   Leukemia Maternal Aunt        diagnosed in her 25s   Breast cancer Cousin        diagnosed in her 30s   Prostate cancer Paternal Uncle     Social History   Socioeconomic History   Marital status: Married    Spouse name: Not on file   Number of children: Not on file    Years of education: Not on file   Highest education level: Not on file  Occupational History   Not on file  Tobacco Use   Smoking status: Never   Smokeless tobacco: Never  Vaping Use   Vaping Use: Never used  Substance and Sexual Activity   Alcohol use: No   Drug use: No   Sexual activity: Yes    Partners: Male    Birth control/protection: Post-menopausal  Other Topics Concern   Not on file  Social History Narrative   Not on file   Social Determinants of Health   Financial Resource Strain: Low Risk  (10/14/2021)   Overall Financial Resource Strain (CARDIA)    Difficulty of Paying Living Expenses: Not hard at all  Food Insecurity: No Food Insecurity (10/14/2021)   Hunger Vital Sign    Worried About Running Out of Food in the Last Year: Never true    Ran Out of Food in the Last Year: Never true  Transportation Needs: No Transportation Needs (10/14/2021)   PRAPARE - Administrator, Civil Service (Medical): No    Lack of Transportation (Non-Medical): No  Physical Activity: Sufficiently Active (10/14/2021)   Exercise Vital Sign    Days of Exercise per Week: 7 days    Minutes of Exercise per Session: 60 min  Stress: No Stress Concern Present (10/14/2021)   [employer] of Occupational Health - Occupational Stress Questionnaire    Feeling of Stress : Not at all  Social Connections: Not on file  Intimate Partner Violence: Not At Risk (03/22/2019)   Humiliation, Afraid, Rape, and Kick questionnaire    Fear of Current or Ex-Partner: No    Emotionally Abused: No    Physically Abused: No    Sexually Abused: No    Review of Systems  All other systems reviewed and are negative.   PHYSICAL EXAMINATION:    LMP 06/21/1998     General appearance: alert, cooperative and appears stated age  Pelvic: External genitalia:  no lesions              Urethra:  normal appearing urethra with no masses, tenderness or lesions  Bartholins and Skenes: normal                  Vagina: normal appearing vagina, well estrogenized. There is an area of granulation tissue in the vaginal apex at 4-5 o'clock, treated with silver nitrate. All other areas of irritation have healed.                Cervix: no lesions               Chaperone was present for exam.  1. Pessary maintenance She needed to leave the pessary out for several weeks secondary to irritation. She has overall done well with the pessary out. Small area of granulation tissue was treated with silver nitrate, other areas of irritation have resolved. -She will space the vaginal estrogen to 2 x a week -She can use the pessary again, will leave it out 2 x a week overnight. She may end up leaving it out more (depending on her activity level). -F/U in 3 months  2. Vaginal irritation from pessary Much improved

## 2022-10-07 ENCOUNTER — Telehealth: Payer: Self-pay

## 2022-10-07 NOTE — Progress Notes (Signed)
Care Management & Coordination Services Pharmacy Team  Reason for Encounter: Medication coordination and delivery  Contacted patient to discuss medications and coordinate delivery from [facility]. Spoke with patient on 10/07/2022  Cycle dispensing form sent to [facility] for review.   Last adherence delivery date: 07-21-2022  Patient is due for next adherence delivery on: 10-19-2022  This delivery to include: Vials  90 Days  Losartan/ HCTZ 100-12.5 mg daily  Atorvastatin 20 mg MWF  Patient declined the following medications this month: Estradiol cream- due in june Aspirin- OTC Calcium- OTC Vit D- OTC Fish oil- OTC Multivitamin- OTC  No refill request needed.  Confirmed delivery date of 10-19-2022, advised patient that pharmacy will contact them the morning of delivery.   Any concerns about your medications? No  How often do you forget or accidentally miss a dose? Never  Do you use a pillbox? No  Is patient in packaging No  Recent blood pressure readings are as follows: 04-17 128/64  Chart review: Recent office visits:  07-16-2022 Little, Karma Lew, RN (CC).  Recent consult visits:  10-06-2022 Romualdo Bolk, MD (OBGYN). Visit for pessary maintenance.  09-08-2022 Joycelyn Man, MD (OBGYN). Visit for pessary maintenance.  Hospital visits:  None in previous 6 months  Medications: Outpatient Encounter Medications as of 10/07/2022  Medication Sig Note   aspirin 81 MG tablet Take 81 mg by mouth daily.    atorvastatin (LIPITOR) 20 MG tablet ONE TAB PO MWF    calcium carbonate (OS-CAL) 600 MG TABS Take 600 mg by mouth 2 (two) times daily with a meal.    Cholecalciferol (VITAMIN D PO) Take 2,000 mg by mouth daily.     estradiol (ESTRACE) 0.1 MG/GM vaginal cream Apply one gram vaginally twice weekly.    guaiFENesin (MUCINEX) 600 MG 12 hr tablet Take 600 mg by mouth 2 (two) times daily.    losartan-hydrochlorothiazide (HYZAAR) 100-12.5 MG tablet  Take 1 tablet by mouth daily.    Magnesium 250 MG TABS Take by mouth. 06/04/2015: Received from: [facility]   Multiple Vitamin (THERA) TABS Take 1 tablet by mouth daily. 06/09/2016: Received from: [facility] Received Sig: Take 1 tablet by mouth daily.   Omega-3 Fatty Acids (FISH OIL) 1000 MG CAPS Take 1 capsule by mouth. 06/04/2015: Received from: [facility]   No facility-administered encounter medications on file as of 10/07/2022.   BP Readings from Last 3 Encounters:  10/06/22 128/64  09/08/22 128/72  03/24/22 128/64    Pulse Readings from Last 3 Encounters:  10/06/22 74  09/08/22 100  03/24/22 65    Lab Results  Component Value Date/Time   HGBA1C 5.3 09/26/2019 12:00 PM   HGBA1C 5.5 03/28/2019 12:00 PM   Lab Results  Component Value Date   CREATININE 1.01 (H) 12/24/2021   BUN 16 12/24/2021   GFRNONAA 60 04/08/2020   GFRAA 69 04/08/2020   NA 142 12/24/2021   K 4.7 12/24/2021   CALCIUM 9.9 12/24/2021   CO2 25 12/24/2021     Malecca Hicks CMA Clinical Pharmacist Assistant [phone]

## 2022-10-13 ENCOUNTER — Ambulatory Visit
Admission: RE | Admit: 2022-10-13 | Discharge: 2022-10-13 | Disposition: A | Discharge status: Home / Self Care | Payer: PPO | Source: Ambulatory Visit | Attending: Otolaryngology | Admitting: Otolaryngology

## 2022-10-13 DIAGNOSIS — Z1231 Encounter for screening mammogram for malignant neoplasm of breast: Secondary | ICD-10-CM | POA: Diagnosis not present

## 2022-10-27 ENCOUNTER — Ambulatory Visit (INDEPENDENT_AMBULATORY_CARE_PROVIDER_SITE_OTHER): Payer: PPO | Admitting: Internal Medicine

## 2022-10-27 ENCOUNTER — Encounter: Payer: Self-pay | Admitting: Internal Medicine

## 2022-10-27 ENCOUNTER — Ambulatory Visit (INDEPENDENT_AMBULATORY_CARE_PROVIDER_SITE_OTHER): Payer: PPO

## 2022-10-27 VITALS — BP 128/80 | HR 80 | Temp 98.0°F | Ht 64.0 in | Wt 164.0 lb

## 2022-10-27 VITALS — BP 120/80 | HR 80 | Temp 98.1°F | Ht 64.0 in | Wt 164.0 lb

## 2022-10-27 DIAGNOSIS — R194 Change in bowel habit: Secondary | ICD-10-CM | POA: Diagnosis not present

## 2022-10-27 DIAGNOSIS — Z6828 Body mass index (BMI) 28.0-28.9, adult: Secondary | ICD-10-CM

## 2022-10-27 DIAGNOSIS — Z0000 Encounter for general adult medical examination without abnormal findings: Secondary | ICD-10-CM

## 2022-10-27 DIAGNOSIS — E7800 Pure hypercholesterolemia, unspecified: Secondary | ICD-10-CM

## 2022-10-27 DIAGNOSIS — I10 Essential (primary) hypertension: Secondary | ICD-10-CM

## 2022-10-27 LAB — POCT URINALYSIS DIPSTICK
Bilirubin, UA: NEGATIVE
Blood, UA: NEGATIVE
Glucose, UA: NEGATIVE
Ketones, UA: NEGATIVE
Leukocytes, UA: NEGATIVE
Nitrite, UA: NEGATIVE
Protein, UA: NEGATIVE
Spec Grav, UA: 1.025 (ref 1.010–1.025)
Urobilinogen, UA: 0.2 E.U./dL
pH, UA: 6.5 (ref 5.0–8.0)

## 2022-10-27 LAB — POC HEMOCCULT BLD/STL (OFFICE/1-CARD/DIAGNOSTIC): Fecal Occult Blood, POC: NEGATIVE

## 2022-10-27 NOTE — Progress Notes (Signed)
Subjective:   Jessica Crosby is a 72 y.o. female who presents for Medicare Annual (Subsequent) preventive examination.  Patient Medicare AWV questionnaire was completed by the patient on 10/27/2022; I have confirmed that all information answered by patient is correct and no changes since this date.     Review of Systems     Cardiac Risk Factors include: advanced age (>17men, >3 women);dyslipidemia;hypertension     Objective:    Today's Vitals   10/27/22 0953 10/27/22 1014  BP: (!) 160/80 128/80  Pulse: 80   Temp: 98 F (36.7 C)   TempSrc: Oral   SpO2: 97%   Weight: 164 lb (74.4 kg)   Height: 5\' 4"  (1.626 m)    Body mass index is 28.15 kg/m.     10/27/2022   10:03 AM 10/14/2021    9:55 AM 10/01/2020   11:01 AM 09/26/2019   11:38 AM 04/17/2019   12:48 PM 03/22/2019   12:13 PM  Advanced Directives  Does Patient Have a Medical Advance Directive? Yes Yes No No Yes No  Type of Estate agent of [place];Living will Healthcare Power of [place];Living will   Healthcare Power of Attorney   Does patient want to make changes to medical advance directive?     No - Patient declined   Copy of Healthcare Power of Attorney in Chart? No - copy requested No - copy requested   No - copy requested   Would patient like information on creating a medical advance directive?   No - Patient declined No - Patient declined      Current Medications (verified) Outpatient Encounter Medications as of 10/27/2022  Medication Sig   aspirin 81 MG tablet Take 81 mg by mouth daily.   atorvastatin (LIPITOR) 20 MG tablet ONE TAB PO MWF   calcium carbonate (OS-CAL) 600 MG TABS Take 600 mg by mouth 2 (two) times daily with a meal.   Cholecalciferol (VITAMIN D PO) Take 2,000 mg by mouth daily.    estradiol (ESTRACE) 0.1 MG/GM vaginal cream Apply one gram vaginally twice weekly.   guaiFENesin (MUCINEX) 600 MG 12 hr tablet Take 600 mg by mouth 2 (two) times daily.    losartan-hydrochlorothiazide (HYZAAR) 100-12.5 MG tablet Take 1 tablet by mouth daily.   Magnesium 250 MG TABS Take by mouth.   Multiple Vitamin (THERA) TABS Take 1 tablet by mouth daily.   Omega-3 Fatty Acids (FISH OIL) 1000 MG CAPS Take 1 capsule by mouth.   No facility-administered encounter medications on file as of 10/27/2022.    Allergies (verified) Ace inhibitors   History: Past Medical History:  Diagnosis Date   Arthritis    GERD (gastroesophageal reflux disease)    Hx of scarlet fever    as achild   Hypertension    Thyroid disease    hyperparathyroidism   Vitamin D deficiency    Past Surgical History:  Procedure Laterality Date   CHOLECYSTECTOMY  06/21/1997   PARATHYROIDECTOMY Right 06/21/2008   Family History  Problem Relation Age of Onset   Breast cancer Sister 61   Prostate cancer Father 108   Breast cancer Maternal Aunt 81   Prostate cancer Paternal Uncle    Throat cancer Paternal Grandfather        pipe smoker   Breast cancer Sister 106   Lymphoma Sister 77   Leukemia Sister 41       as a result of chemotherapy from her lymphoma   Cancer Sister 7  non hodgins lymphoma/ leukemia   Pancreatic cancer Maternal Aunt 65   Colon cancer Maternal Aunt        diagnosed in her 80s   Thyroid cancer Maternal Aunt        dx in her 80s   Leukemia Maternal Aunt        diagnosed in her 23s   Breast cancer Cousin        diagnosed in her 46s   Prostate cancer Paternal Uncle    Social History   Socioeconomic History   Marital status: Married    Spouse name: Not on file   Number of children: Not on file   Years of education: Not on file   Highest education level: Not on file  Occupational History   Not on file  Tobacco Use   Smoking status: Never   Smokeless tobacco: Never  Vaping Use   Vaping Use: Never used  Substance and Sexual Activity   Alcohol use: No   Drug use: No   Sexual activity: Yes    Partners: Male    Birth control/protection:  Post-menopausal  Other Topics Concern   Not on file  Social History Narrative   Not on file   Social Determinants of Health   Financial Resource Strain: Low Risk  (10/27/2022)   Overall Financial Resource Strain (CARDIA)    Difficulty of Paying Living Expenses: Not hard at all  Food Insecurity: No Food Insecurity (10/27/2022)   Hunger Vital Sign    Worried About Running Out of Food in the Last Year: Never true    Ran Out of Food in the Last Year: Never true  Transportation Needs: No Transportation Needs (10/27/2022)   PRAPARE - Administrator, Civil Service (Medical): No    Lack of Transportation (Non-Medical): No  Physical Activity: Sufficiently Active (10/27/2022)   Exercise Vital Sign    Days of Exercise per Week: 4 days    Minutes of Exercise per Session: 140 min  Stress: No Stress Concern Present (10/27/2022)   [employer] of Occupational Health - Occupational Stress Questionnaire    Feeling of Stress : Not at all  Social Connections: Unknown (10/27/2022)   Social Connection and Isolation Panel [NHANES]    Frequency of Communication with Friends and Family: More than three times a week    Frequency of Social Gatherings with Friends and Family: More than three times a week    Attends Religious Services: Not on Marketing executive or Organizations: Yes    Attends Banker Meetings: Never    Marital Status: Married    Tobacco Counseling Counseling given: Not Answered   Clinical Intake:  Pre-visit preparation completed: Yes  Pain : No/denies pain     Nutritional Status: BMI 25 -29 Overweight Nutritional Risks: None Diabetes: No  How often do you need to have someone help you when you read instructions, pamphlets, or other written materials from your doctor or pharmacy?: 1 - Never  Diabetic? no  Interpreter Needed?: No  Information entered by :: NAllen LPN   Activities of Daily Living    10/27/2022    8:53 AM  In your present  state of health, do you have any difficulty performing the following activities:  Hearing? 0  Vision? 0  Difficulty concentrating or making decisions? 0  Walking or climbing stairs? 0  Dressing or bathing? 0  Doing errands, shopping? 0  Preparing Food and eating ? N  Using  the Toilet? N  In the past six months, have you accidently leaked urine? N  Do you have problems with loss of bowel control? N  Managing your Medications? N  Managing your Finances? N  Housekeeping or managing your Housekeeping? N    Patient Care Team: Dorothyann Peng, MD as PCP - General (Internal Medicine) Clarene Duke, Karma Lew, RN  Indicate any recent Medical Services you may have received from other than [facility] providers in the past year (date may be approximate).     Assessment:   This is a routine wellness examination for Jessica Crosby.  Hearing/Vision screen Vision Screening - Comments:: Regular eye exams, Dr. Shea Evans  Dietary issues and exercise activities discussed: Current Exercise Habits: Structured exercise class, Type of exercise: yoga;strength training/weights, Time (Minutes): > 60, Frequency (Times/Week): 4, Weekly Exercise (Minutes/Week): 0   Goals Addressed             This Visit's Progress    Patient Stated       10/27/2022, wants to lose weight       Depression Screen    10/27/2022   10:04 AM 12/24/2021   10:23 AM 10/14/2021    9:56 AM 04/09/2021    8:37 AM 10/01/2020   11:20 AM 10/01/2020   11:19 AM 10/01/2020   11:01 AM  PHQ 2/9 Scores  PHQ - 2 Score 0 0 0 0 0 0 0  PHQ- 9 Score  0  0 0      Fall Risk    10/27/2022    8:53 AM 10/14/2021    9:56 AM 10/01/2020   11:01 AM 09/26/2019   11:38 AM 09/26/2019   10:48 AM  Fall Risk   Falls in the past year? 0 0 0 0 0  Number falls in past yr: 0 0   0  Injury with Fall? 0 0   0  Risk for fall due to : Medication side effect Medication side effect Medication side effect Medication side effect   Follow up Falls prevention discussed;Education  provided;Falls evaluation completed Falls evaluation completed;Education provided;Falls prevention discussed Falls evaluation completed;Education provided;Falls prevention discussed Falls evaluation completed;Education provided;Falls prevention discussed     FALL RISK PREVENTION PERTAINING TO THE HOME:  Any stairs in or around the home? No  If so, are there any without handrails? N/a Home free of loose throw rugs in walkways, pet beds, electrical cords, etc? Yes  Adequate lighting in your home to reduce risk of falls? Yes   ASSISTIVE DEVICES UTILIZED TO PREVENT FALLS:  Life alert? No  Use of a cane, walker or w/c? No  Grab bars in the bathroom? No  Shower chair or bench in shower? Yes  Elevated toilet seat or a handicapped toilet? Yes   TIMED UP AND GO:  Was the test performed? Yes .  Length of time to ambulate 10 feet: 5 sec.   Gait steady and fast without use of assistive device  Cognitive Function:        10/27/2022   10:05 AM 10/14/2021    9:57 AM 10/01/2020   11:03 AM 09/26/2019   11:40 AM 03/22/2019   12:15 PM  6CIT Screen  What Year? 0 points 0 points 0 points 0 points 0 points  What month? 0 points 0 points 0 points 0 points 0 points  What time? 0 points 0 points 0 points 0 points 0 points  Count back from 20 0 points 0 points 0 points 0 points 0 points  Months in reverse  0 points 0 points 0 points 0 points 0 points  Repeat phrase 0 points 2 points 2 points 0 points 0 points  Total Score 0 points 2 points 2 points 0 points 0 points    Immunizations Immunization History  Administered Date(s) Administered   DT (Pediatric) 02/12/2003   Fluad Quad(high Dose 65+) 04/08/2020, 04/09/2021   Influenza, High Dose Seasonal PF 03/13/2018, 03/01/2019, 03/31/2022   Influenza, Seasonal, Injecte, Preservative Fre 04/29/2001, 05/11/2005, 03/30/2006, 05/12/2007, 04/01/2015   Influenza-Unspecified 04/29/2001, 05/11/2005, 03/30/2006, 05/12/2007, 04/19/2014, 04/01/2015, 03/26/2017    PFIZER(Purple Top)SARS-COV-2 Vaccination 07/13/2019, 08/03/2019   Pneumococcal Conjugate-13 05/26/2016   Pneumococcal Polysaccharide-23 03/28/2019   Tdap 03/04/2015   Zoster Recombinat (Shingrix) 10/14/2021, 12/24/2021   Zoster, Live 06/22/2010    TDAP status: Up to date  Flu Vaccine status: Up to date  Pneumococcal vaccine status: Up to date  Covid-19 vaccine status: Completed vaccines  Qualifies for Shingles Vaccine? Yes   Zostavax completed Yes   Shingrix Completed?: Yes  Screening Tests Health Maintenance  Topic Date Due   Medicare Annual Wellness (AWV)  10/15/2022   COVID-19 Vaccine (3 - 2023-24 season) 11/12/2022 (Originally 02/19/2022)   INFLUENZA VACCINE  01/20/2023   MAMMOGRAM  10/12/2024   DTaP/Tdap/Td (3 - Td or Tdap) 03/03/2025   COLONOSCOPY (Pts 45-52yrs Insurance coverage will need to be confirmed)  02/04/2032   Pneumonia Vaccine 66+ Years old  Completed   DEXA SCAN  Completed   Hepatitis C Screening  Completed   Zoster Vaccines- Shingrix  Completed   HPV VACCINES  Aged Out    Health Maintenance  Health Maintenance Due  Topic Date Due   Medicare Annual Wellness (AWV)  10/15/2022    Colorectal cancer screening: No longer required.   Mammogram status: Completed 10/13/2022. Repeat every year  Bone Density status: Completed 03/12/2021.   Lung Cancer Screening: (Low Dose CT Chest recommended if Age 30-80 years, 30 pack-year currently smoking OR have quit w/in 15years.) does not qualify.   Lung Cancer Screening Referral: no  Additional Screening:  Hepatitis C Screening: does qualify; Completed 03/28/2019  Vision Screening: Recommended annual ophthalmology exams for early detection of glaucoma and other disorders of the eye. Is the patient up to date with their annual eye exam?  Yes  Who is the provider or what is the name of the office in which the patient attends annual eye exams? Dr. Shea Evans If pt is not established with a provider, would they like to be  referred to a provider to establish care? No .   Dental Screening: Recommended annual dental exams for proper oral hygiene  Community Resource Referral / Chronic Care Management: CRR required this visit?  No   CCM required this visit?  No      Plan:     I have personally reviewed and noted the following in the patient's chart:   Medical and social history Use of alcohol, tobacco or illicit drugs  Current medications and supplements including opioid prescriptions. Patient is not currently taking opioid prescriptions. Functional ability and status Nutritional status Physical activity Advanced directives List of other physicians Hospitalizations, surgeries, and ER visits in previous 12 months Vitals Screenings to include cognitive, depression, and falls Referrals and appointments  In addition, I have reviewed and discussed with patient certain preventive protocols, quality metrics, and best practice recommendations. A written personalized care plan for preventive services as well as general preventive health recommendations were provided to patient.     Barb Merino, LPN   06/26/1094  Nurse Notes: none

## 2022-10-27 NOTE — Progress Notes (Signed)
I,Victoria T Hamilton,acting as a scribe for [name], MD.,have documented all relevant documentation on the behalf of [name], MD,as directed by  [name], MD while in the presence of [name], MD.    Subjective:     Patient ID: Jessica Crosby , female    DOB: 1951-01-11 , 72 y.o.   MRN: [id number]   Chief Complaint  Patient presents with   Hypertension   Hyperlipidemia    HPI  Patient is here for HTN & cholesterol follow up. She reports compliance with meds.  She denies headaches, chest pain and shortness of breath.  She reports recently noticing her stool comes out like "pebbles". She admits having bowel movements every day. She denies eating foods high in fiber. She reports colonoscopy completed in 2023. She denies having any abdominal discomfort, n/v/d.   She is also scheduled for AWV today with [facility] Advisor.    Hypertension This is a chronic problem. The current episode started more than 1 year ago. The problem has been gradually improving since onset. The problem is controlled. Pertinent negatives include no blurred vision, chest pain, palpitations or shortness of breath. Risk factors for coronary artery disease include dyslipidemia and post-menopausal state. Past treatments include angiotensin blockers and diuretics. Compliance problems include exercise.   Hyperlipidemia Pertinent negatives include no chest pain or shortness of breath. The current treatment provides moderate improvement of lipids. There are no compliance problems.  Risk factors for coronary artery disease include dyslipidemia and post-menopausal.     Past Medical History:  Diagnosis Date   Arthritis    GERD (gastroesophageal reflux disease)    Hx of scarlet fever    as achild   Hypertension    Thyroid disease    hyperparathyroidism   Vitamin D deficiency      Family History  Problem Relation Age of Onset   Breast cancer Sister 10   Prostate cancer Father 25    Breast cancer Maternal Aunt 81   Prostate cancer Paternal Uncle    Throat cancer Paternal Grandfather        pipe smoker   Breast cancer Sister 73   Lymphoma Sister 61   Leukemia Sister 89       as a result of chemotherapy from her lymphoma   Cancer Sister 1       non hodgins lymphoma/ leukemia   Pancreatic cancer Maternal Aunt 66   Colon cancer Maternal Aunt        diagnosed in her 52s   Thyroid cancer Maternal Aunt        dx in her 18s   Leukemia Maternal Aunt        diagnosed in her 37s   Breast cancer Cousin        diagnosed in her 48s   Prostate cancer Paternal Uncle      Current Outpatient Medications:    aspirin 81 MG tablet, Take 81 mg by mouth daily., Disp: , Rfl:    atorvastatin (LIPITOR) 20 MG tablet, ONE TAB PO MWF, Disp: 45 tablet, Rfl: 2   calcium carbonate (OS-CAL) 600 MG TABS, Take 600 mg by mouth 2 (two) times daily with a meal., Disp: , Rfl:    Cholecalciferol (VITAMIN D PO), Take 2,000 mg by mouth daily. , Disp: , Rfl:    estradiol (ESTRACE) 0.1 MG/GM vaginal cream, Apply one gram vaginally twice weekly., Disp: 42.5 g, Rfl: 1   guaiFENesin (MUCINEX) 600 MG 12 hr tablet, Take  600 mg by mouth 2 (two) times daily., Disp: , Rfl:    losartan-hydrochlorothiazide (HYZAAR) 100-12.5 MG tablet, Take 1 tablet by mouth daily., Disp: 90 tablet, Rfl: 2   Magnesium 250 MG TABS, Take by mouth., Disp: , Rfl:    Multiple Vitamin (THERA) TABS, Take 1 tablet by mouth daily., Disp: , Rfl:    Omega-3 Fatty Acids (FISH OIL) 1000 MG CAPS, Take 1 capsule by mouth., Disp: , Rfl:    Allergies  Allergen Reactions   Ace Inhibitors Cough     Review of Systems  Constitutional: Negative.   Eyes:  Negative for blurred vision.  Respiratory: Negative.  Negative for shortness of breath.   Cardiovascular: Negative.  Negative for chest pain and palpitations.  Gastrointestinal:  Positive for constipation.  Musculoskeletal: Negative.   Skin: Negative.   Neurological: Negative.    Psychiatric/Behavioral: Negative.       Today's Vitals   10/27/22 1027  BP: 120/80  Pulse: 80  Temp: 98.1 F (36.7 C)  SpO2: 98%  Weight: 164 lb (74.4 kg)  Height: 5\' 4"  (1.626 m)   Body mass index is 28.15 kg/m.  Wt Readings from Last 3 Encounters:  10/27/22 164 lb (74.4 kg)  10/27/22 164 lb (74.4 kg)  10/06/22 165 lb (74.8 kg)    Objective:  Physical Exam Vitals and nursing note reviewed.  Constitutional:      Appearance: Normal appearance.  HENT:     Head: Normocephalic and atraumatic.  Eyes:     Extraocular Movements: Extraocular movements intact.  Cardiovascular:     Rate and Rhythm: Normal rate and regular rhythm.     Heart sounds: Normal heart sounds.  Pulmonary:     Effort: Pulmonary effort is normal.     Breath sounds: Normal breath sounds.  Abdominal:     General: Bowel sounds are normal.     Palpations: Abdomen is soft.     Tenderness: There is no abdominal tenderness.  Skin:    General: Skin is warm.  Neurological:     General: No focal deficit present.     Mental Status: She is alert.  Psychiatric:        Mood and Affect: Mood normal.        Behavior: Behavior normal.         Assessment And Plan:     1. Essential hypertension Comments: Chronic, well controlled. She will c/w losartan/hct 100/12.5mg  daily. Adivsed to follow low sodium diet.  She will f/u in 4-6 months for re-evaluation. - CMP14+EGFR - CBC - Lipid panel - Microalbumin / creatinine urine ratio - POCT Urinalysis Dipstick (81002)  2. Pure hypercholesterolemia Comments: Chronic, currently on atorvastatin 20mg  daily. Advised to follow heart healty lifestyle. She was commended on her regular exercise regimen. - Lipid panel  3. Change in bowel habits Comments: Advised to increase fluid and high fiber fruit intake. She admits to not eating alot of vegetables. She will also try Miralax prn. She will let me know if sx persist.  - POC Hemoccult Bld/Stl (1-Cd Office Dx)  4. Body  mass index (BMI) of 28.0-28.9 in adult Comments: She is encouraged to aim for at least 150 minutes of exercise/week.     Patient was given opportunity to ask questions. Patient verbalized understanding of the plan and was able to repeat key elements of the plan. All questions were answered to their satisfaction.   I, [name], MD, have reviewed all documentation for this visit. The documentation on 10/27/22  for the exam, diagnosis, procedures, and orders are all accurate and complete.   IF YOU HAVE BEEN REFERRED TO A SPECIALIST, IT MAY TAKE 1-2 WEEKS TO SCHEDULE/PROCESS THE REFERRAL. IF YOU HAVE NOT HEARD FROM US/SPECIALIST IN TWO WEEKS, PLEASE GIVE [place] A CALL AT [phone].   THE PATIENT IS ENCOURAGED TO PRACTICE SOCIAL DISTANCING DUE TO THE COVID-19 PANDEMIC.

## 2022-10-27 NOTE — Patient Instructions (Addendum)
Please add apples, pears, salads, broccoli, cauliflower, cabbage into your diet.   Hypertension, Adult Hypertension is another name for high blood pressure. High blood pressure forces your heart to work harder to pump blood. This can cause problems over time. There are two numbers in a blood pressure reading. There is a top number (systolic) over a bottom number (diastolic). It is best to have a blood pressure that is below 120/80. What are the causes? The cause of this condition is not known. Some other conditions can lead to high blood pressure. What increases the risk? Some lifestyle factors can make you more likely to develop high blood pressure: Smoking. Not getting enough exercise or physical activity. Being overweight. Having too much fat, sugar, calories, or salt (sodium) in your diet. Drinking too much alcohol. Other risk factors include: Having any of these conditions: Heart disease. Diabetes. High cholesterol. Kidney disease. Obstructive sleep apnea. Having a family history of high blood pressure and high cholesterol. Age. The risk increases with age. Stress. What are the signs or symptoms? High blood pressure may not cause symptoms. Very high blood pressure (hypertensive crisis) may cause: Headache. Fast or uneven heartbeats (palpitations). Shortness of breath. Nosebleed. Vomiting or feeling like you may vomit (nauseous). Changes in how you see. Very bad chest pain. Feeling dizzy. Seizures. How is this treated? This condition is treated by making healthy lifestyle changes, such as: Eating healthy foods. Exercising more. Drinking less alcohol. Your doctor may prescribe medicine if lifestyle changes do not help enough and if: Your top number is above 130. Your bottom number is above 80. Your personal target blood pressure may vary. Follow these instructions at home: Eating and drinking  If told, follow the DASH eating plan. To follow this plan: Fill one half  of your plate at each meal with fruits and vegetables. Fill one fourth of your plate at each meal with whole grains. Whole grains include whole-wheat pasta, brown rice, and whole-grain bread. Eat or drink low-fat dairy products, such as skim milk or low-fat yogurt. Fill one fourth of your plate at each meal with low-fat (lean) proteins. Low-fat proteins include fish, chicken without skin, eggs, beans, and tofu. Avoid fatty meat, cured and processed meat, or chicken with skin. Avoid pre-made or processed food. Limit the amount of salt in your diet to less than 1,500 mg each day. Do not drink alcohol if: Your doctor tells you not to drink. You are pregnant, may be pregnant, or are planning to become pregnant. If you drink alcohol: Limit how much you have to: 0-1 drink a day for women. 0-2 drinks a day for men. Know how much alcohol is in your drink. In the [place] one drink equals one 12 oz bottle of beer (355 mL), one 5 oz glass of wine (148 mL), or one 1 oz glass of hard liquor (44 mL). Lifestyle  Work with your doctor to stay at a healthy weight or to lose weight. Ask your doctor what the best weight is for you. Get at least 30 minutes of exercise that causes your heart to beat faster (aerobic exercise) most days of the week. This may include walking, swimming, or biking. Get at least 30 minutes of exercise that strengthens your muscles (resistance exercise) at least 3 days a week. This may include lifting weights or doing Pilates. Do not smoke or use any products that contain nicotine or tobacco. If you need help quitting, ask your doctor. Check your blood pressure at home as told  by your doctor. Keep all follow-up visits. Medicines Take over-the-counter and prescription medicines only as told by your doctor. Follow directions carefully. Do not skip doses of blood pressure medicine. The medicine does not work as well if you skip doses. Skipping doses also puts you at risk for problems. Ask  your doctor about side effects or reactions to medicines that you should watch for. Contact a doctor if: You think you are having a reaction to the medicine you are taking. You have headaches that keep coming back. You feel dizzy. You have swelling in your ankles. You have trouble with your vision. Get help right away if: You get a very bad headache. You start to feel mixed up (confused). You feel weak or numb. You feel faint. You have very bad pain in your: Chest. Belly (abdomen). You vomit more than once. You have trouble breathing. These symptoms may be an emergency. Get help right away. Call 911. Do not wait to see if the symptoms will go away. Do not drive yourself to the hospital. Summary Hypertension is another name for high blood pressure. High blood pressure forces your heart to work harder to pump blood. For most people, a normal blood pressure is less than 120/80. Making healthy choices can help lower blood pressure. If your blood pressure does not get lower with healthy choices, you may need to take medicine. This information is not intended to replace advice given to you by your health care provider. Make sure you discuss any questions you have with your health care provider. Document Revised: 03/26/2021 Document Reviewed: 03/26/2021 Elsevier Patient Education  2023 [employer].

## 2022-10-27 NOTE — Patient Instructions (Signed)
Jessica Crosby , Thank you for taking time to come for your Medicare Wellness Visit. I appreciate your ongoing commitment to your health goals. Please review the following plan we discussed and let me know if I can assist you in the future.   These are the goals we discussed:  Goals       I would like to get back to Yoga 3 days a week (pt-stated)      Care Coordination Interventions: Patient interviewed about adult health maintenance status including  Influenza Vaccine Notified provider of adult immunization status and request for consideration of order for/scheduling of Influenza Vaccine Educated patient about the provider referral exercise program, she may consider in the future Provided printed education about Elderly Nutrition 101         Patient Stated      10/01/2020, wants to get stronger      Patient Stated      10/14/2021, wants to eat healthier      Patient Stated      10/27/2022, wants to lose weight      Weight (lb) < 200 lb (90.7 kg)      03/22/2019, wants to weigh 140 pounds      Weight (lb) < 200 lb (90.7 kg)      09/26/2019, wants to get to 140 pounds, by stop eating cookies        This is a list of the screening recommended for you and due dates:  Health Maintenance  Topic Date Due   COVID-19 Vaccine (3 - 2023-24 season) 11/12/2022*   Flu Shot  01/20/2023   Medicare Annual Wellness Visit  10/27/2023   Mammogram  10/12/2024   DTaP/Tdap/Td vaccine (3 - Td or Tdap) 03/03/2025   Colon Cancer Screening  02/04/2032   Pneumonia Vaccine  Completed   DEXA scan (bone density measurement)  Completed   Hepatitis C Screening: USPSTF Recommendation to screen - Ages 73-79 yo.  Completed   Zoster (Shingles) Vaccine  Completed   HPV Vaccine  Aged Out  *Topic was postponed. The date shown is not the original due date.    Advanced directives: Please bring a copy of your POA (Power of Attorney) and/or Living Will to your next appointment.   Conditions/risks identified:  none  Next appointment: Follow up in one year for your annual wellness visit    Preventive Care 65 Years and Older, Female Preventive care refers to lifestyle choices and visits with your health care provider that can promote health and wellness. What does preventive care include? A yearly physical exam. This is also called an annual well check. Dental exams once or twice a year. Routine eye exams. Ask your health care provider how often you should have your eyes checked. Personal lifestyle choices, including: Daily care of your teeth and gums. Regular physical activity. Eating a healthy diet. Avoiding tobacco and drug use. Limiting alcohol use. Practicing safe sex. Taking low-dose aspirin every day. Taking vitamin and mineral supplements as recommended by your health care provider. What happens during an annual well check? The services and screenings done by your health care provider during your annual well check will depend on your age, overall health, lifestyle risk factors, and family history of disease. Counseling  Your health care provider may ask you questions about your: Alcohol use. Tobacco use. Drug use. Emotional well-being. Home and relationship well-being. Sexual activity. Eating habits. History of falls. Memory and ability to understand (cognition). Work and work Astronomer. Reproductive health. Screening  You may have the following tests or measurements: Height, weight, and BMI. Blood pressure. Lipid and cholesterol levels. These may be checked every 5 years, or more frequently if you are over 75 years old. Skin check. Lung cancer screening. You may have this screening every year starting at age 37 if you have a 30-pack-year history of smoking and currently smoke or have quit within the past 15 years. Fecal occult blood test (FOBT) of the stool. You may have this test every year starting at age 6. Flexible sigmoidoscopy or colonoscopy. You may have a  sigmoidoscopy every 5 years or a colonoscopy every 10 years starting at age 39. Hepatitis C blood test. Hepatitis B blood test. Sexually transmitted disease (STD) testing. Diabetes screening. This is done by checking your blood sugar (glucose) after you have not eaten for a while (fasting). You may have this done every 1-3 years. Bone density scan. This is done to screen for osteoporosis. You may have this done starting at age 30. Mammogram. This may be done every 1-2 years. Talk to your health care provider about how often you should have regular mammograms. Talk with your health care provider about your test results, treatment options, and if necessary, the need for more tests. Vaccines  Your health care provider may recommend certain vaccines, such as: Influenza vaccine. This is recommended every year. Tetanus, diphtheria, and acellular pertussis (Tdap, Td) vaccine. You may need a Td booster every 10 years. Zoster vaccine. You may need this after age 64. Pneumococcal 13-valent conjugate (PCV13) vaccine. One dose is recommended after age 68. Pneumococcal polysaccharide (PPSV23) vaccine. One dose is recommended after age 29. Talk to your health care provider about which screenings and vaccines you need and how often you need them. This information is not intended to replace advice given to you by your health care provider. Make sure you discuss any questions you have with your health care provider. Document Released: 07/04/2015 Document Revised: 02/25/2016 Document Reviewed: 04/08/2015 Elsevier Interactive Patient Education  2017 [employer].  Fall Prevention in the Home Falls can cause injuries. They can happen to people of all ages. There are many things you can do to make your home safe and to help prevent falls. What can I do on the outside of my home? Regularly fix the edges of walkways and driveways and fix any cracks. Remove anything that might make you trip as you walk through a  door, such as a raised step or threshold. Trim any bushes or trees on the path to your home. Use bright outdoor lighting. Clear any walking paths of anything that might make someone trip, such as rocks or tools. Regularly check to see if handrails are loose or broken. Make sure that both sides of any steps have handrails. Any raised decks and porches should have guardrails on the edges. Have any leaves, snow, or ice cleared regularly. Use sand or salt on walking paths during winter. Clean up any spills in your garage right away. This includes oil or grease spills. What can I do in the bathroom? Use night lights. Install grab bars by the toilet and in the tub and shower. Do not use towel bars as grab bars. Use non-skid mats or decals in the tub or shower. If you need to sit down in the shower, use a plastic, non-slip stool. Keep the floor dry. Clean up any water that spills on the floor as soon as it happens. Remove soap buildup in the tub or shower regularly.  Attach bath mats securely with double-sided non-slip rug tape. Do not have throw rugs and other things on the floor that can make you trip. What can I do in the bedroom? Use night lights. Make sure that you have a light by your bed that is easy to reach. Do not use any sheets or blankets that are too big for your bed. They should not hang down onto the floor. Have a firm chair that has side arms. You can use this for support while you get dressed. Do not have throw rugs and other things on the floor that can make you trip. What can I do in the kitchen? Clean up any spills right away. Avoid walking on wet floors. Keep items that you use a lot in easy-to-reach places. If you need to reach something above you, use a strong step stool that has a grab bar. Keep electrical cords out of the way. Do not use floor polish or wax that makes floors slippery. If you must use wax, use non-skid floor wax. Do not have throw rugs and other things  on the floor that can make you trip. What can I do with my stairs? Do not leave any items on the stairs. Make sure that there are handrails on both sides of the stairs and use them. Fix handrails that are broken or loose. Make sure that handrails are as long as the stairways. Check any carpeting to make sure that it is firmly attached to the stairs. Fix any carpet that is loose or worn. Avoid having throw rugs at the top or bottom of the stairs. If you do have throw rugs, attach them to the floor with carpet tape. Make sure that you have a light switch at the top of the stairs and the bottom of the stairs. If you do not have them, ask someone to add them for you. What else can I do to help prevent falls? Wear shoes that: Do not have high heels. Have rubber bottoms. Are comfortable and fit you well. Are closed at the toe. Do not wear sandals. If you use a stepladder: Make sure that it is fully opened. Do not climb a closed stepladder. Make sure that both sides of the stepladder are locked into place. Ask someone to hold it for you, if possible. Clearly mark and make sure that you can see: Any grab bars or handrails. First and last steps. Where the edge of each step is. Use tools that help you move around (mobility aids) if they are needed. These include: Canes. Walkers. Scooters. Crutches. Turn on the lights when you go into a dark area. Replace any light bulbs as soon as they burn out. Set up your furniture so you have a clear path. Avoid moving your furniture around. If any of your floors are uneven, fix them. If there are any pets around you, be aware of where they are. Review your medicines with your doctor. Some medicines can make you feel dizzy. This can increase your chance of falling. Ask your doctor what other things that you can do to help prevent falls. This information is not intended to replace advice given to you by your health care provider. Make sure you discuss any  questions you have with your health care provider. Document Released: 04/03/2009 Document Revised: 11/13/2015 Document Reviewed: 07/12/2014 Elsevier Interactive Patient Education  2017 [employer].

## 2022-10-28 LAB — MICROALBUMIN / CREATININE URINE RATIO
Creatinine, Urine: 247.8 mg/dL
Microalb/Creat Ratio: 6 mg/g creat (ref 0–29)
Microalbumin, Urine: 15.9 ug/mL

## 2022-10-28 LAB — CMP14+EGFR
ALT: 27 IU/L (ref 0–32)
AST: 19 IU/L (ref 0–40)
Albumin/Globulin Ratio: 2.4 — ABNORMAL HIGH (ref 1.2–2.2)
Albumin: 4.6 g/dL (ref 3.8–4.8)
Alkaline Phosphatase: 99 IU/L (ref 44–121)
BUN/Creatinine Ratio: 15 (ref 12–28)
BUN: 15 mg/dL (ref 8–27)
Bilirubin Total: 0.7 mg/dL (ref 0.0–1.2)
CO2: 24 mmol/L (ref 20–29)
Calcium: 9.8 mg/dL (ref 8.7–10.3)
Chloride: 102 mmol/L (ref 96–106)
Creatinine, Ser: 1.02 mg/dL — ABNORMAL HIGH (ref 0.57–1.00)
Globulin, Total: 1.9 g/dL (ref 1.5–4.5)
Glucose: 90 mg/dL (ref 70–99)
Potassium: 4.4 mmol/L (ref 3.5–5.2)
Sodium: 142 mmol/L (ref 134–144)
Total Protein: 6.5 g/dL (ref 6.0–8.5)
eGFR: 58 mL/min/{1.73_m2} — ABNORMAL LOW (ref >59)

## 2022-10-28 LAB — CBC
Hematocrit: 43.6 % (ref 34.0–46.6)
Hemoglobin: 14.4 g/dL (ref 11.1–15.9)
MCH: 29.6 pg (ref 26.6–33.0)
MCHC: 33 g/dL (ref 31.5–35.7)
MCV: 90 fL (ref 79–97)
Platelets: 317 10*3/uL (ref 150–450)
RBC: 4.87 x10E6/uL (ref 3.77–5.28)
RDW: 12.5 % (ref 11.7–15.4)
WBC: 6.4 10*3/uL (ref 3.4–10.8)

## 2022-10-28 LAB — LIPID PANEL
Chol/HDL Ratio: 2.1 ratio (ref 0.0–4.4)
Cholesterol, Total: 168 mg/dL (ref 100–199)
HDL: 81 mg/dL (ref >39)
LDL Chol Calc (NIH): 70 mg/dL (ref 0–99)
Triglycerides: 94 mg/dL (ref 0–149)
VLDL Cholesterol Cal: 17 mg/dL (ref 5–40)

## 2022-10-29 ENCOUNTER — Ambulatory Visit: Payer: Self-pay

## 2022-10-29 NOTE — Patient Outreach (Signed)
  Care Coordination   Follow Up Visit Note   10/29/2022 Name: JAILEEN MALKIEWICZ MRN: [id number] DOB: 21-Oct-1950  SHAY KOTARSKI is a 72 y.o. year old female who sees Dorothyann Peng, MD for primary care. I spoke with  [name] by phone today.  What matters to the patients health and wellness today?  Patient would like to increase her daily water intake to 48-64 oz daily to help improve her kidney function.     Goals Addressed               This Visit's Progress     Patient Stated     COMPLETED: I would like to get back to Yoga 3 days a week (pt-stated)        Care Coordination Interventions: Completed successful outbound call with patient Determined patient continues to stay physically active with yoga, as well as she participates in other exercise regimens outside her home Positive reinforcement given to patient for making efforts to improve her overall health  Educated patient about the PREP program, she may consider in the future      Other     To increase daily water intake to help improve kidney function        Care Coordination Interventions: Assessed the Patient understanding of chronic kidney disease    Evaluation of current treatment plan related to chronic kidney disease self management and patient's adherence to plan as established by provider      Reviewed prescribed diet increase water to 48-64 oz daily unless otherwise directed by your doctor  Discussed complications of poorly controlled blood pressure such as heart disease, stroke, circulatory complications, vision complications, kidney impairment, sexual dysfunction    Provided education on kidney disease progression    BP Readings from Last 3 Encounters:  10/27/22 120/80  10/27/22 128/80  10/06/22 128/64  Most recent eGFR/CrCl:  Lab Results  Component Value Date   EGFR 58 (L) 10/27/2022    No components found for: "CRCL"   Interventions Today    Flowsheet Row Most Recent Value   Chronic Disease   Chronic disease during today's visit Chronic Kidney Disease/End Stage Renal Disease (ESRD), Hypertension (HTN)  General Interventions   General Interventions Discussed/Reviewed General Interventions Discussed, General Interventions Reviewed, Doctor Visits, Labs  Labs Kidney Function  Doctor Visits Discussed/Reviewed Doctor Visits Discussed, Doctor Visits Reviewed, PCP  Exercise Interventions   Exercise Discussed/Reviewed Exercise Discussed, Exercise Reviewed, Physical Activity  Physical Activity Discussed/Reviewed Physical Activity Reviewed, Physical Activity Discussed, PREP, Types of exercise, Gym  Education Interventions   Education Provided Provided Education  Provided Verbal Education On Labs, Medication  Labs Reviewed Kidney Function, Lipid Profile  Nutrition Interventions   Nutrition Discussed/Reviewed Fluid intake  Pharmacy Interventions   Pharmacy Dicussed/Reviewed Pharmacy Topics Discussed, Medications and their functions, Pharmacy Topics Reviewed          SDOH assessments and interventions completed:  No     Care Coordination Interventions:  Yes, provided   Follow up plan: Follow up call scheduled for 05/20/23 @10 :30 AM    Encounter Outcome:  Pt. Visit Completed

## 2022-10-29 NOTE — Patient Instructions (Signed)
Visit Information  Thank you for taking time to visit with me today. Please don't hesitate to contact me if I can be of assistance to you.   Following are the goals we discussed today:   Goals Addressed               This Visit's Progress     Patient Stated     COMPLETED: I would like to get back to Yoga 3 days a week (pt-stated)        Care Coordination Interventions: Completed successful outbound call with patient Determined patient continues to stay physically active with yoga, as well as she participates in other exercise regimens outside her home Positive reinforcement given to patient for making efforts to improve her overall health  Educated patient about the PREP program, she may consider in the future      Other     To increase daily water intake to help improve kidney function        Care Coordination Interventions: Assessed the Patient understanding of chronic kidney disease    Evaluation of current treatment plan related to chronic kidney disease self management and patient's adherence to plan as established by provider      Reviewed prescribed diet increase water to 48-64 oz daily unless otherwise directed by your doctor  Discussed complications of poorly controlled blood pressure such as heart disease, stroke, circulatory complications, vision complications, kidney impairment, sexual dysfunction    Provided education on kidney disease progression    BP Readings from Last 3 Encounters:  10/27/22 120/80  10/27/22 128/80  10/06/22 128/64  Most recent eGFR/CrCl:  Lab Results  Component Value Date   EGFR 58 (L) 10/27/2022    No components found for: "CRCL"        Our next appointment is by telephone on 05/20/23 at 10:30 AM  Please call the care guide team at [phone] if you need to cancel or reschedule your appointment.   If you are experiencing a Mental Health or Behavioral Health Crisis or need someone to talk to, please call 1-800-273-TALK (toll free,  24 hour hotline) go to [facility] [address] [place] [phone])  Patient verbalizes understanding of instructions and care plan provided today and agrees to view in MyChart. Active MyChart status and patient understanding of how to access instructions and care plan via MyChart confirmed with patient.     Delsa Sale, RN, BSN, CCM Care Management Coordinator [facility] Care Management Direct Phone: [phone]

## 2022-12-29 NOTE — Progress Notes (Deleted)
GYNECOLOGY  VISIT   HPI: 72 y.o.   Married  Caucasian  female   [phone] with Patient's last menstrual period was 06/21/1998.   here for  pessary check   GYNECOLOGIC HISTORY: Patient's last menstrual period was 06/21/1998. Contraception:  PMP Menopausal hormone therapy:  estrace Last mammogram:  10/13/22 Breast density Cat C, BI-RADS CAT 1 neg Last pap smear:   06/22/18 neg: HR HPV neg        OB History     Gravida  3   Para  3   Term  3   Preterm      AB      Living  3      SAB      IAB      Ectopic      Multiple      Live Births                 Patient Active Problem List   Diagnosis Date Noted   Colon cancer screening 10/06/2022   Obesity 10/06/2022   Second degree hemorrhoids 10/06/2022   Night sweats 04/13/2020   Primary insomnia 04/13/2020   Situational anxiety 04/13/2020   Vitamin D deficiency disease 04/13/2020   Allergic rhinitis due to other allergen 06/10/2017   Depression 06/10/2017   Pericarditis 06/10/2017   Scarlet fever 06/10/2017   Single skin nodule 06/25/2014   Multiple thyroid nodules 01/08/2014   Essential hypertension 12/25/2013   H/O hyperparathyroidism 12/25/2013   Abdominal pain, chronic, epigastric 05/09/2012   Hypercalcemia 12/30/2008   Family history of malignant neoplasm of breast 03/30/2006   Fibrocystic breast disease 03/30/2006   Enthesopathy of hip region 08/30/2005   Esophageal reflux 12/27/2003   Mixed hyperlipidemia 02/12/2003   Osteopenia 01/26/2000    Past Medical History:  Diagnosis Date   Arthritis    GERD (gastroesophageal reflux disease)    Hx of scarlet fever    as achild   Hypertension    Thyroid disease    hyperparathyroidism   Vitamin D deficiency     Past Surgical History:  Procedure Laterality Date   CHOLECYSTECTOMY  06/21/1997   PARATHYROIDECTOMY Right 06/21/2008    Current Outpatient Medications  Medication Sig Dispense Refill   aspirin 81 MG tablet Take 81 mg by mouth daily.      atorvastatin (LIPITOR) 20 MG tablet ONE TAB PO MWF 45 tablet 2   calcium carbonate (OS-CAL) 600 MG TABS Take 600 mg by mouth 2 (two) times daily with a meal.     Cholecalciferol (VITAMIN D PO) Take 2,000 mg by mouth daily.      estradiol (ESTRACE) 0.1 MG/GM vaginal cream Apply one gram vaginally twice weekly. 42.5 g 1   guaiFENesin (MUCINEX) 600 MG 12 hr tablet Take 600 mg by mouth 2 (two) times daily.     losartan-hydrochlorothiazide (HYZAAR) 100-12.5 MG tablet Take 1 tablet by mouth daily. 90 tablet 2   Magnesium 250 MG TABS Take by mouth.     Multiple Vitamin (THERA) TABS Take 1 tablet by mouth daily.     Omega-3 Fatty Acids (FISH OIL) 1000 MG CAPS Take 1 capsule by mouth.     No current facility-administered medications for this visit.     ALLERGIES: Ace inhibitors  Family History  Problem Relation Age of Onset   Breast cancer Sister 12   Prostate cancer Father 66   Breast cancer Maternal Aunt 81   Prostate cancer Paternal Uncle    Throat cancer Paternal Grandfather  pipe smoker   Breast cancer Sister 22   Lymphoma Sister 18   Leukemia Sister 27       as a result of chemotherapy from her lymphoma   Cancer Sister 53       non hodgins lymphoma/ leukemia   Pancreatic cancer Maternal Aunt 46   Colon cancer Maternal Aunt        diagnosed in her 36s   Thyroid cancer Maternal Aunt        dx in her 71s   Leukemia Maternal Aunt        diagnosed in her 62s   Breast cancer Cousin        diagnosed in her 58s   Prostate cancer Paternal Uncle     Social History   Socioeconomic History   Marital status: Married    Spouse name: Not on file   Number of children: Not on file   Years of education: Not on file   Highest education level: Not on file  Occupational History   Not on file  Tobacco Use   Smoking status: Never   Smokeless tobacco: Never  Vaping Use   Vaping Use: Never used  Substance and Sexual Activity   Alcohol use: No   Drug use: No   Sexual activity: Yes     Partners: Male    Birth control/protection: Post-menopausal  Other Topics Concern   Not on file  Social History Narrative   Not on file   Social Determinants of Health   Financial Resource Strain: Low Risk  (10/27/2022)   Overall Financial Resource Strain (CARDIA)    Difficulty of Paying Living Expenses: Not hard at all  Food Insecurity: No Food Insecurity (10/27/2022)   Hunger Vital Sign    Worried About Running Out of Food in the Last Year: Never true    Ran Out of Food in the Last Year: Never true  Transportation Needs: No Transportation Needs (10/27/2022)   PRAPARE - Administrator, Civil Service (Medical): No    Lack of Transportation (Non-Medical): No  Physical Activity: Sufficiently Active (10/27/2022)   Exercise Vital Sign    Days of Exercise per Week: 4 days    Minutes of Exercise per Session: 140 min  Stress: No Stress Concern Present (10/27/2022)   [employer] of Occupational Health - Occupational Stress Questionnaire    Feeling of Stress : Not at all  Social Connections: Unknown (10/27/2022)   Social Connection and Isolation Panel [NHANES]    Frequency of Communication with Friends and Family: More than three times a week    Frequency of Social Gatherings with Friends and Family: More than three times a week    Attends Religious Services: Not on file    Active Member of Clubs or Organizations: Yes    Attends Banker Meetings: Never    Marital Status: Married  Catering manager Violence: Not At Risk (03/22/2019)   Humiliation, Afraid, Rape, and Kick questionnaire    Fear of Current or Ex-Partner: No    Emotionally Abused: No    Physically Abused: No    Sexually Abused: No    Review of Systems  PHYSICAL EXAMINATION:    LMP 06/21/1998     General appearance: alert, cooperative and appears stated age Head: Normocephalic, without obvious abnormality, atraumatic Neck: no adenopathy, supple, symmetrical, trachea midline and thyroid normal to  inspection and palpation Lungs: clear to auscultation bilaterally Breasts: normal appearance, no masses or tenderness, No nipple retraction or  dimpling, No nipple discharge or bleeding, No axillary or supraclavicular adenopathy Heart: regular rate and rhythm Abdomen: soft, non-tender, no masses,  no organomegaly Extremities: extremities normal, atraumatic, no cyanosis or edema Skin: Skin color, texture, turgor normal. No rashes or lesions Lymph nodes: Cervical, supraclavicular, and axillary nodes normal. No abnormal inguinal nodes palpated Neurologic: Grossly normal  Pelvic: External genitalia:  no lesions              Urethra:  normal appearing urethra with no masses, tenderness or lesions              Bartholins and Skenes: normal                 Vagina: normal appearing vagina with normal color and discharge, no lesions              Cervix: no lesions                Bimanual Exam:  Uterus:  normal size, contour, position, consistency, mobility, non-tender              Adnexa: no mass, fullness, tenderness              Rectal exam: {yes no:314532}.  Confirms.              Anus:  normal sphincter tone, no lesions  Chaperone was present for exam:  ***  ASSESSMENT     PLAN     An After Visit Summary was printed and given to the patient.  ______ minutes face to face time of which over 50% was spent in counseling.

## 2023-01-05 ENCOUNTER — Ambulatory Visit: Payer: PPO | Admitting: Obstetrics and Gynecology

## 2023-01-05 ENCOUNTER — Encounter: Payer: PPO | Admitting: Internal Medicine

## 2023-01-06 ENCOUNTER — Encounter: Payer: PPO | Admitting: Internal Medicine

## 2023-01-06 ENCOUNTER — Ambulatory Visit: Payer: PPO | Admitting: Radiology

## 2023-01-12 ENCOUNTER — Encounter: Payer: Self-pay | Admitting: Radiology

## 2023-01-12 ENCOUNTER — Ambulatory Visit (INDEPENDENT_AMBULATORY_CARE_PROVIDER_SITE_OTHER): Payer: PPO | Admitting: Radiology

## 2023-01-12 VITALS — BP 162/88

## 2023-01-12 DIAGNOSIS — Z4689 Encounter for fitting and adjustment of other specified devices: Secondary | ICD-10-CM | POA: Diagnosis not present

## 2023-01-12 DIAGNOSIS — N814 Uterovaginal prolapse, unspecified: Secondary | ICD-10-CM

## 2023-01-12 NOTE — Progress Notes (Signed)
      Subjective: Jessica Crosby is a 72 y.o. female here for pessary maintenance. No problems with pessary. Denies discharge, irritation or bleeding. Using estrace twice weekly. Takes out and cleans 1-2 times week without difficulty.    Review of Systems  All other systems reviewed and are negative.   Past Medical History:  Diagnosis Date   Arthritis    GERD (gastroesophageal reflux disease)    Hx of scarlet fever    as achild   Hypertension    Thyroid disease    hyperparathyroidism   Vitamin D deficiency       Objective:  Today's Vitals   01/12/23 1503 01/12/23 1504  BP: (!) 160/84 (!) 162/88   There is no height or weight on file to calculate BMI.   Physical Exam Vitals and nursing note reviewed. Exam conducted with a chaperone present.  Constitutional:      Appearance: Normal appearance. She is normal weight.  Pulmonary:     Effort: Pulmonary effort is normal.  Genitourinary:    General: Normal vulva.     Labia:        Right: No rash.        Left: No rash.      Vagina: Prolapsed vaginal walls present. No vaginal discharge, erythema, tenderness, bleeding or lesions.     Comments: #6ring pessary with support removed without difficulty. Vaginal inspected, no erosion, irritation or discharge. Pessary cleaned and put back in place easily. Tolerated well. Neurological:     Mental Status: She is alert.  Psychiatric:        Mood and Affect: Mood normal.        Thought Content: Thought content normal.        Judgment: Judgment normal.      [name], CMA present for exam  Assessment:/Plan:   1. Pessary maintenance  2. Cystocele with uterine prolapse Follow up in 6 months for pessary management, due for B&P at that time as well

## 2023-02-18 ENCOUNTER — Other Ambulatory Visit: Payer: Self-pay

## 2023-02-18 MED ORDER — ATORVASTATIN CALCIUM 20 MG PO TABS
ORAL_TABLET | ORAL | 2 refills | Status: DC
Start: 2023-02-18 — End: 2023-02-23

## 2023-02-23 ENCOUNTER — Other Ambulatory Visit: Payer: Self-pay

## 2023-02-23 MED ORDER — ATORVASTATIN CALCIUM 20 MG PO TABS
ORAL_TABLET | ORAL | 2 refills | Status: DC
Start: 2023-02-23 — End: 2023-10-24

## 2023-02-23 MED ORDER — LOSARTAN POTASSIUM-HCTZ 100-12.5 MG PO TABS: 1.0000 | ORAL_TABLET | Freq: Every day | ORAL | 2 refills | Status: DC

## 2023-03-30 DIAGNOSIS — H40053 Ocular hypertension, bilateral: Secondary | ICD-10-CM | POA: Diagnosis not present

## 2023-03-30 DIAGNOSIS — H40013 Open angle with borderline findings, low risk, bilateral: Secondary | ICD-10-CM | POA: Diagnosis not present

## 2023-04-25 ENCOUNTER — Ambulatory Visit: Payer: PPO | Admitting: Internal Medicine

## 2023-04-25 ENCOUNTER — Encounter: Payer: Self-pay | Admitting: Internal Medicine

## 2023-04-25 VITALS — BP 142/80 | HR 83 | Temp 98.7°F | Ht 64.0 in | Wt 161.0 lb

## 2023-04-25 DIAGNOSIS — E168 Other specified disorders of pancreatic internal secretion: Secondary | ICD-10-CM | POA: Diagnosis not present

## 2023-04-25 DIAGNOSIS — E2839 Other primary ovarian failure: Secondary | ICD-10-CM | POA: Diagnosis not present

## 2023-04-25 DIAGNOSIS — I10 Essential (primary) hypertension: Secondary | ICD-10-CM | POA: Diagnosis not present

## 2023-04-25 DIAGNOSIS — Z2821 Immunization not carried out because of patient refusal: Secondary | ICD-10-CM

## 2023-04-25 DIAGNOSIS — Z0000 Encounter for general adult medical examination without abnormal findings: Secondary | ICD-10-CM

## 2023-04-25 DIAGNOSIS — E7800 Pure hypercholesterolemia, unspecified: Secondary | ICD-10-CM

## 2023-04-25 NOTE — Progress Notes (Signed)
I,Jameka J Llittleton, CMA,acting as a Neurosurgeon for [name], MD.,have documented all relevant documentation on the behalf of [name], MD,as directed by  [name], MD while in the presence of [name], MD.  Subjective:    Patient ID: Jessica Crosby , female    DOB: [birth year] , 72 y.o.   MRN: [id number]  Chief Complaint  Patient presents with   Annual Exam   Hypertension    HPI  She is here today for a full physical examination.  She is followed by Leota Sauers for her GYN exams. She reports compliance with meds.  She denies headaches, chest pain and shortness of breath. She has no specific concerns at this time.  She is stressed about moving. Her husband wants to downsize, she wants to continue to live near her family as well as her church.    She denies having any hospitalizations since her last visit.   Hypertension This is a chronic problem. The current episode started more than 1 year ago. The problem has been gradually improving since onset. The problem is controlled. Pertinent negatives include no blurred vision. Risk factors for coronary artery disease include post-menopausal state. The current treatment provides moderate improvement. Compliance problems include exercise.      Past Medical History:  Diagnosis Date   Arthritis    GERD (gastroesophageal reflux disease)    Hx of scarlet fever    as achild   Hypertension    Thyroid disease    hyperparathyroidism   Vitamin D deficiency      Family History  Problem Relation Age of Onset   Breast cancer Sister 49   Prostate cancer Father 66   Breast cancer Maternal Aunt 81   Prostate cancer Paternal Uncle    Throat cancer Paternal Grandfather        pipe smoker   Breast cancer Sister 42   Lymphoma Sister 76   Leukemia Sister 57       as a result of chemotherapy from her lymphoma   Cancer Sister 62       non hodgins lymphoma/ leukemia   Pancreatic cancer Maternal Aunt 2   Colon  cancer Maternal Aunt        diagnosed in her 45s   Thyroid cancer Maternal Aunt        dx in her 68s   Leukemia Maternal Aunt        diagnosed in her 58s   Breast cancer Cousin        diagnosed in her 69s   Prostate cancer Paternal Uncle      Current Outpatient Medications:    aspirin 81 MG tablet, Take 81 mg by mouth daily., Disp: , Rfl:    atorvastatin (LIPITOR) 20 MG tablet, ONE TAB PO MWF, Disp: 45 tablet, Rfl: 2   calcium carbonate (OS-CAL) 600 MG TABS, Take 600 mg by mouth 2 (two) times daily with a meal., Disp: , Rfl:    Cholecalciferol (VITAMIN D PO), Take 2,000 mg by mouth daily. , Disp: , Rfl:    estradiol (ESTRACE) 0.1 MG/GM vaginal cream, Apply one gram vaginally twice weekly., Disp: 42.5 g, Rfl: 1   guaiFENesin (MUCINEX) 600 MG 12 hr tablet, Take 600 mg by mouth 2 (two) times daily., Disp: , Rfl:    losartan-hydrochlorothiazide (HYZAAR) 100-12.5 MG tablet, Take 1 tablet by mouth daily., Disp: 90 tablet, Rfl: 2   Magnesium 250 MG TABS, Take by mouth., Disp: , Rfl:  Multiple Vitamin (THERA) TABS, Take 1 tablet by mouth daily., Disp: , Rfl:    Omega-3 Fatty Acids (FISH OIL) 1000 MG CAPS, Take 1 capsule by mouth., Disp: , Rfl:    Allergies  Allergen Reactions   Ace Inhibitors Cough      The patient states she uses status post hysterectomy for birth control. Patient's last menstrual period was 06/21/1998.. Negative for Dysmenorrhea. Negative for: breast discharge, breast lump(s), breast pain and breast self exam. Associated symptoms include abnormal vaginal bleeding. Pertinent negatives include abnormal bleeding (hematology), anxiety, decreased libido, depression, difficulty falling sleep, dyspareunia, history of infertility, nocturia, sexual dysfunction, sleep disturbances, urinary incontinence, urinary urgency, vaginal discharge and vaginal itching. Diet regular.The patient states her exercise level is moderate.  . The patient's tobacco use is:  Social History   Tobacco  Use  Smoking Status Never  Smokeless Tobacco Never  . She has been exposed to passive smoke. The patient's alcohol use is:  Social History   Substance and Sexual Activity  Alcohol Use No   Review of Systems  Constitutional: Negative.   HENT: Negative.    Eyes: Negative.  Negative for blurred vision.  Respiratory: Negative.    Cardiovascular: Negative.   Gastrointestinal: Negative.   Endocrine: Negative.   Genitourinary: Negative.   Musculoskeletal: Negative.   Skin: Negative.   Allergic/Immunologic: Negative.   Neurological: Negative.   Hematological: Negative.   Psychiatric/Behavioral: Negative.       Today's Vitals   04/25/23 1156 04/25/23 1216  BP: (!) 140/80 (!) 142/80  Pulse: 83   Temp: 98.7 F (37.1 C)   Weight: 161 lb (73 kg)   Height: 5\' 4"  (1.626 m)   PainSc: 0-No pain    Body mass index is 27.64 kg/m.  Wt Readings from Last 3 Encounters:  04/25/23 161 lb (73 kg)  10/27/22 164 lb (74.4 kg)  10/27/22 164 lb (74.4 kg)    BP Readings from Last 3 Encounters:  04/25/23 (!) 142/80  01/12/23 (!) 162/88  10/27/22 120/80     Objective:  Physical Exam Vitals and nursing note reviewed.  Constitutional:      Appearance: Normal appearance.  HENT:     Head: Normocephalic and atraumatic.     Right Ear: Tympanic membrane, ear canal and external ear normal.     Left Ear: Tympanic membrane, ear canal and external ear normal.     Nose: Nose normal.     Mouth/Throat:     Mouth: Mucous membranes are moist.     Pharynx: Oropharynx is clear.  Eyes:     Extraocular Movements: Extraocular movements intact.     Conjunctiva/sclera: Conjunctivae normal.     Pupils: Pupils are equal, round, and reactive to light.  Cardiovascular:     Rate and Rhythm: Normal rate and regular rhythm.     Pulses: Normal pulses.     Heart sounds: Normal heart sounds.  Pulmonary:     Effort: Pulmonary effort is normal.     Breath sounds: Normal breath sounds.  Chest:  Breasts:     Tanner Score is 5.     Right: Normal.     Left: Normal.  Abdominal:     General: Abdomen is flat. Bowel sounds are normal.     Palpations: Abdomen is soft.  Genitourinary:    Comments: deferred Musculoskeletal:        General: Normal range of motion.     Cervical back: Normal range of motion and neck supple.  Skin:  General: Skin is warm and dry.  Neurological:     General: No focal deficit present.     Mental Status: She is alert and oriented to person, place, and time.  Psychiatric:        Mood and Affect: Mood normal.        Behavior: Behavior normal.         Assessment And Plan:     Encounter for general adult medical examination w/o abnormal findings Assessment & Plan: A full exam was performed.  Importance of monthly self breast exams was discussed with the patient.  She is advised to get 30-45 minutes of regular exercise, no less than four to five days per week. Both weight-bearing and aerobic exercises are recommended.  She is advised to follow a healthy diet with at least six fruits/veggies per day, decrease intake of red meat and other saturated fats and to increase fish intake to twice weekly.  Meats/fish should not be fried -- baked, boiled or broiled is preferable. It is also important to cut back on your sugar intake.  Be sure to read labels - try to avoid anything with added sugar, high fructose corn syrup or other sweeteners.  If you must use a sweetener, you can try stevia or monkfruit.  It is also important to avoid artificially sweetened foods/beverages and diet drinks. Lastly, wear SPF 50 sunscreen on exposed skin and when in direct sunlight for an extended period of time.  Be sure to avoid fast food restaurants and aim for at least 60 ounces of water daily.    .   Essential hypertension Assessment & Plan: Chronic, fair control. EKG performed, NSR w/o acute changes. I will not make any med changes at this time. She will continue with losartan/hct 100/12.5mg   daily. Encouraged to follow low sodium diet. We discussed strategies to make this possible upcoming move more pleasant to her. She agrees to f/u in 3 weeks for NV.   Orders: -     EKG 12-Lead -     CMP14+EGFR -     CBC  Pure hypercholesterolemia Assessment & Plan: Chronic, she will continue with atorvastatin 20mg  MWF. She is encouraged to follow heart healthy lifestyle. She is encouraged to aim for at least 150 minutes of exercise per week.   Orders: -     TSH -     CMP14+EGFR  Estrogen deficiency -     DG Bone Density; Future  Elevated fasting insulin level with normal glucose -     Hemoglobin A1c  COVID-19 vaccination declined     Return in 2 weeks (on 05/09/2023), or NV - bp check, for 1 year physical, 6 month bp. Patient was given opportunity to ask questions. Patient verbalized understanding of the plan and was able to repeat key elements of the plan. All questions were answered to their satisfaction.     I, [name], MD, have reviewed all documentation for this visit. The documentation on 04/25/23 for the exam, diagnosis, procedures, and orders are all accurate and complete.

## 2023-04-25 NOTE — Assessment & Plan Note (Addendum)

## 2023-04-26 ENCOUNTER — Encounter: Payer: Self-pay | Admitting: Internal Medicine

## 2023-04-26 LAB — CMP14+EGFR
ALT: 34 [IU]/L — ABNORMAL HIGH (ref 0–32)
AST: 24 [IU]/L (ref 0–40)
Albumin: 4.8 g/dL (ref 3.8–4.8)
Alkaline Phosphatase: 103 [IU]/L (ref 44–121)
BUN/Creatinine Ratio: 17 (ref 12–28)
BUN: 16 mg/dL (ref 8–27)
Bilirubin Total: 0.7 mg/dL (ref 0.0–1.2)
CO2: 26 mmol/L (ref 20–29)
Calcium: 10.1 mg/dL (ref 8.7–10.3)
Chloride: 100 mmol/L (ref 96–106)
Creatinine, Ser: 0.93 mg/dL (ref 0.57–1.00)
Globulin, Total: 1.6 g/dL (ref 1.5–4.5)
Glucose: 83 mg/dL (ref 70–99)
Potassium: 4.4 mmol/L (ref 3.5–5.2)
Sodium: 141 mmol/L (ref 134–144)
Total Protein: 6.4 g/dL (ref 6.0–8.5)
eGFR: 65 mL/min/{1.73_m2} (ref >59)

## 2023-04-26 LAB — CBC
Hematocrit: 45.8 % (ref 34.0–46.6)
Hemoglobin: 14.9 g/dL (ref 11.1–15.9)
MCH: 29.4 pg (ref 26.6–33.0)
MCHC: 32.5 g/dL (ref 31.5–35.7)
MCV: 90 fL (ref 79–97)
Platelets: 348 10*3/uL (ref 150–450)
RBC: 5.07 x10E6/uL (ref 3.77–5.28)
RDW: 12.5 % (ref 11.7–15.4)
WBC: 8.1 10*3/uL (ref 3.4–10.8)

## 2023-04-26 LAB — HEMOGLOBIN A1C
Est. average glucose Bld gHb Est-mCnc: 117 mg/dL
Hgb A1c MFr Bld: 5.7 % — ABNORMAL HIGH (ref 4.8–5.6)

## 2023-04-26 LAB — TSH: TSH: 2.26 u[IU]/mL (ref 0.450–4.500)

## 2023-04-27 ENCOUNTER — Ambulatory Visit: Payer: Self-pay

## 2023-04-27 NOTE — Patient Outreach (Signed)
Care Coordination   Follow Up Visit Note   04/27/2023 Name: Jessica Crosby MRN: [id number] DOB: [birth year]  Jessica Crosby is a 72 y.o. year old female who sees Dorothyann Peng, MD for primary care. I spoke with  [name] by phone today.  What matters to the patients health and wellness today?  Patient would like to reduce her sugar intake and monitor her BP at home more frequently.     Goals Addressed             This Visit's Progress    Monitor blood pressure at home more frequently       Care Coordination Interventions: Evaluation of current treatment plan related to hypertension self management and patient's adherence to plan as established by provider Reviewed medications with patient and discussed importance of compliance Advised patient, providing education and rationale, to monitor blood pressure daily and record, calling PCP for findings outside established parameters Discussed patient will take her BP cuff into PCP to check for accuracy and technique Provided verbal and written instructions on how to accurately monitor BP at home Reviewed and discussed with patient her next upcoming scheduled visit with PCP nurse for BP check scheduled for 05/10/23 @09 :20 AM Mailed printed educational materials related to What is High Blood Pressure Last practice recorded BP readings:  BP Readings from Last 3 Encounters:  04/25/23 (!) 142/80  01/12/23 (!) 162/88  10/27/22 120/80   Most recent eGFR/CrCl:  Lab Results  Component Value Date   EGFR 65 04/25/2023    No components found for: "CRCL"      Reduce sugar intake to help lower A1c       Care Coordination Interventions: Provided education to patient about basic pre-diabetes disease process Review of patient status, including review of consultants reports, relevant laboratory and other test results, and medications completed Counseled patient about using portion control and meal planning to help  improve A1c Mailed printed educational materials related to Eating Well with Diabetes; Meal Planning; Carb Choice list  Lab Results  Component Value Date   HGBA1C 5.7 (H) 04/25/2023       COMPLETED: To increase daily water intake to help improve kidney function       Care Coordination Interventions: Assessed the Patient understanding of chronic kidney disease    Evaluation of current treatment plan related to chronic kidney disease self management and patient's adherence to plan as established by provider     Review of patient status, including review of consultant's reports, relevant laboratory and other test results, and medications completed Positive reinforcement given to patient for improving her kidney function   Reiterated the importance to continue aiming for 48-64 oz of water daily unless otherwise directed by your doctor  Last practice recorded BP readings:  BP Readings from Last 3 Encounters:  04/25/23 (!) 142/80  01/12/23 (!) 162/88  10/27/22 120/80   Most recent eGFR/CrCl:  Lab Results  Component Value Date   EGFR 65 04/25/2023    No components found for: "CRCL"     Interventions Today    Flowsheet Row Most Recent Value  Chronic Disease   Chronic disease during today's visit Hypertension (HTN), Diabetes, Chronic Kidney Disease/End Stage Renal Disease (ESRD)  General Interventions   General Interventions Discussed/Reviewed General Interventions Discussed, General Interventions Reviewed, Doctor Visits, Labs, Communication with  Doctor Visits Discussed/Reviewed Doctor Visits Discussed, Doctor Visits Reviewed, PCP  Communication with PCP/Specialists  [Dr. Allyne Gee re: Dexa Scan]  Education Interventions   Education  Provided Provided Education  Provided Verbal Education On Medication, When to see the doctor, Other, Nutrition, Labs, Exercise  [monitoring BP]  Labs Reviewed Hgb A1c, Kidney Function  Nutrition Interventions   Nutrition Discussed/Reviewed Nutrition  Discussed, Nutrition Reviewed, Carbohydrate meal planning, Adding fruits and vegetables, Fluid intake, Portion sizes  Pharmacy Interventions   Pharmacy Dicussed/Reviewed Pharmacy Topics Reviewed, Pharmacy Topics Discussed, Medications and their functions          SDOH assessments and interventions completed:  No     Care Coordination Interventions:  Yes, provided   Follow up plan: Follow up call scheduled for 06/07/23 @11 :00 AM    Encounter Outcome:  Patient Visit Completed

## 2023-04-27 NOTE — Patient Instructions (Signed)
Visit Information  Thank you for taking time to visit with me today. Please don't hesitate to contact me if I can be of assistance to you.   Following are the goals we discussed today:   Goals Addressed             This Visit's Progress    Monitor blood pressure at home more frequently       Care Coordination Interventions: Evaluation of current treatment plan related to hypertension self management and patient's adherence to plan as established by provider Reviewed medications with patient and discussed importance of compliance Advised patient, providing education and rationale, to monitor blood pressure daily and record, calling PCP for findings outside established parameters Discussed patient will take her BP cuff into PCP to check for accuracy and technique Provided verbal and written instructions on how to accurately monitor BP at home Reviewed and discussed with patient her next upcoming scheduled visit with PCP nurse for BP check scheduled for 05/10/23 @09 :20 AM Mailed printed educational materials related to What is High Blood Pressure Last practice recorded BP readings:  BP Readings from Last 3 Encounters:  04/25/23 (!) 142/80  01/12/23 (!) 162/88  10/27/22 120/80   Most recent eGFR/CrCl:  Lab Results  Component Value Date   EGFR 65 04/25/2023    No components found for: "CRCL"           Reduce sugar intake to help lower A1c       Care Coordination Interventions: Provided education to patient about basic pre-diabetes disease process Review of patient status, including review of consultants reports, relevant laboratory and other test results, and medications completed Counseled patient about using portion control and meal planning to help improve A1c Mailed printed educational materials related to Eating Well with Diabetes; Meal Planning; Carb Choice list  Lab Results  Component Value Date   HGBA1C 5.7 (H) 04/25/2023           COMPLETED: To increase daily  water intake to help improve kidney function       Care Coordination Interventions: Assessed the Patient understanding of chronic kidney disease    Evaluation of current treatment plan related to chronic kidney disease self management and patient's adherence to plan as established by provider     Review of patient status, including review of consultant's reports, relevant laboratory and other test results, and medications completed Positive reinforcement given to patient for improving her kidney function   Reiterated the importance to continue aiming for 48-64 oz of water daily unless otherwise directed by your doctor  Last practice recorded BP readings:  BP Readings from Last 3 Encounters:  04/25/23 (!) 142/80  01/12/23 (!) 162/88  10/27/22 120/80   Most recent eGFR/CrCl:  Lab Results  Component Value Date   EGFR 65 04/25/2023    No components found for: "CRCL"          Our next appointment is by telephone on 06/07/23 at 11:00 AM  Please call the care guide team at [phone] if you need to cancel or reschedule your appointment.   If you are experiencing a Mental Health or Behavioral Health Crisis or need someone to talk to, please call 1-800-273-TALK (toll free, 24 hour hotline)  Patient verbalizes understanding of instructions and care plan provided today and agrees to view in MyChart. Active MyChart status and patient understanding of how to access instructions and care plan via MyChart confirmed with patient.     Delsa Sale RN BSN CCM [place]  [facility], [facility] Health Nurse Care Coordinator  Direct Dial: [phone] Website: [email or web address]

## 2023-05-01 NOTE — Assessment & Plan Note (Addendum)
Chronic, fair control. EKG performed, NSR w/o acute changes. I will not make any med changes at this time. She will continue with losartan/hct 100/12.5mg  daily. Encouraged to follow low sodium diet. We discussed strategies to make this possible upcoming move more pleasant to her. She agrees to f/u in 3 weeks for NV.

## 2023-05-01 NOTE — Assessment & Plan Note (Signed)
Chronic, she will continue with atorvastatin 20mg  MWF. She is encouraged to follow heart healthy lifestyle. She is encouraged to aim for at least 150 minutes of exercise per week.

## 2023-05-10 ENCOUNTER — Encounter: Payer: Self-pay | Admitting: Internal Medicine

## 2023-05-10 ENCOUNTER — Ambulatory Visit: Payer: PPO

## 2023-05-10 VITALS — BP 132/88 | HR 75 | Temp 98.1°F | Ht 64.0 in | Wt 161.0 lb

## 2023-05-10 DIAGNOSIS — I10 Essential (primary) hypertension: Secondary | ICD-10-CM

## 2023-05-10 MED ORDER — AMLODIPINE BESYLATE 2.5 MG PO TABS: 2.5000 mg | ORAL_TABLET | Freq: Every day | ORAL | 1 refills | Status: DC

## 2023-05-10 NOTE — Progress Notes (Signed)
She presents today for bpc. She currently takes Losartan-hydrochlorothiazide 100-12.5 she reports taking in the morning. Denies headache, chest pain & sob. She admits walking regularly.  BP Readings from Last 3 Encounters:  05/10/23 132/88  04/25/23 (!) 142/80  01/12/23 (!) 162/88  Per provider, she will add Amlodpine 2.5mg  to take nightly at dinner. Prescription sent. Patient will follow up in 3 weeks for nurse visit, bpc. Patient aware.

## 2023-05-18 ENCOUNTER — Encounter: Payer: PPO | Admitting: Internal Medicine

## 2023-05-31 ENCOUNTER — Ambulatory Visit: Payer: PPO

## 2023-06-01 ENCOUNTER — Other Ambulatory Visit: Payer: Self-pay | Admitting: Internal Medicine

## 2023-06-01 DIAGNOSIS — N958 Other specified menopausal and perimenopausal disorders: Secondary | ICD-10-CM | POA: Diagnosis not present

## 2023-06-01 DIAGNOSIS — M8588 Other specified disorders of bone density and structure, other site: Secondary | ICD-10-CM | POA: Diagnosis not present

## 2023-06-01 DIAGNOSIS — E2839 Other primary ovarian failure: Secondary | ICD-10-CM | POA: Diagnosis not present

## 2023-06-01 LAB — HM DEXA SCAN

## 2023-06-03 ENCOUNTER — Other Ambulatory Visit: Payer: Self-pay

## 2023-06-03 ENCOUNTER — Ambulatory Visit: Payer: PPO

## 2023-06-03 VITALS — BP 150/90 | HR 71 | Temp 98.1°F | Ht 64.0 in | Wt 161.0 lb

## 2023-06-03 DIAGNOSIS — I10 Essential (primary) hypertension: Secondary | ICD-10-CM

## 2023-06-03 MED ORDER — AMLODIPINE BESYLATE 5 MG PO TABS: ORAL_TABLET | ORAL | 0 refills | Status: DC

## 2023-06-03 NOTE — Progress Notes (Signed)
Patient presents today for bpc. She currently takes Amlodipine 2.5 at night around 7pm. Along with Losartan hydrochlorothiazide 100-12.5 in the morning. Denies headache, chest pain & sob. Initial bp: 160/90. She admits feeling anxious when she comes into the doctors office.  BP Readings from Last 3 Encounters:  06/03/23 (!) 150/90  05/10/23 132/88  04/25/23 (!) 142/80  Per provider patient is to increase Amlodipine to 5mg  nightly. Patient aware. 5MG  dose sent to the pharmacy. Patient is scheduled to come back in 4 weeks.

## 2023-06-07 ENCOUNTER — Encounter: Payer: Self-pay | Admitting: Internal Medicine

## 2023-06-07 ENCOUNTER — Ambulatory Visit: Payer: Self-pay

## 2023-06-07 NOTE — Patient Instructions (Signed)
Visit Information  Thank you for taking time to visit with me today. Please don't hesitate to contact me if I can be of assistance to you.   Following are the goals we discussed today:   Goals Addressed             This Visit's Progress    Monitor blood pressure at home more frequently   On track    Care Coordination Interventions: Evaluation of current treatment plan related to hypertension self management and patient's adherence to plan as established by provider Reviewed medications with patient and discussed recent dosage increase with Amlodipine to 5 mg daily in the evening and patient reports adherence  Advised patient, providing education and rationale, to monitor blood pressure daily and record, calling PCP for findings outside established parameters Provided verbal and printed educational materials related to, How to Accurately Measure Blood Pressure at Home; Why Should I Limit Sodium? Reviewed and discussed patient's next scheduled PCP follow up for BP check set for 07/06/23 @10 :40 PM Discussed plans with patient for ongoing care coordination follow up and provided patient with direct contact information for nurse care coordinator Last practice recorded BP readings:  BP Readings from Last 3 Encounters:  06/03/23 (!) 150/90  05/10/23 132/88  04/25/23 (!) 142/80   Most recent eGFR/CrCl:  Lab Results  Component Value Date   EGFR 65 04/25/2023    No components found for: "CRCL"           Our next appointment is by telephone on 06/29/23 at 2:00 PM  Please call the care guide team at [phone] if you need to cancel or reschedule your appointment.   If you are experiencing a Mental Health or Behavioral Health Crisis or need someone to talk to, please call 1-800-273-TALK (toll free, 24 hour hotline)  Patient verbalizes understanding of instructions and care plan provided today and agrees to view in MyChart. Active MyChart status and patient understanding of how to  access instructions and care plan via MyChart confirmed with patient.     Delsa Sale RN BSN CCM [place]  [facility], [facility] Health Nurse Care Coordinator  Direct Dial: [phone] Website: [email or web address]

## 2023-06-07 NOTE — Patient Outreach (Addendum)
  Care Coordination   Follow Up Visit Note   06/07/2023 Name: Jessica Crosby MRN: [id number] DOB: 1951/01/24  Jessica Crosby is a 72 y.o. year old female who sees Dorothyann Peng, MD for primary care. I spoke with  [name] by phone today.  What matters to the patients health and wellness today?  Patient will continue to monitor her BP at home and record her readings. She will notify her PCP of abnormal findings.     Goals Addressed             This Visit's Progress    Monitor blood pressure at home more frequently   On track    Care Coordination Interventions: Evaluation of current treatment plan related to hypertension self management and patient's adherence to plan as established by provider Reviewed medications with patient and discussed recent dosage increase with Amlodipine to 5 mg daily in the evening and patient reports adherence  Advised patient, providing education and rationale, to monitor blood pressure daily and record, calling PCP for findings outside established parameters Provided verbal and printed educational materials related to, How to Accurately Measure Blood Pressure at Home; Why Should I Limit Sodium? Reviewed and discussed patient's next scheduled PCP follow up for BP check set for 07/06/23 @10 :40 PM Discussed plans with patient for ongoing care coordination follow up and provided patient with direct contact information for nurse care coordinator Last practice recorded BP readings:  BP Readings from Last 3 Encounters:  06/03/23 (!) 150/90  05/10/23 132/88  04/25/23 (!) 142/80   Most recent eGFR/CrCl:  Lab Results  Component Value Date   EGFR 65 04/25/2023    No components found for: "CRCL"       Interventions Today    Flowsheet Row Most Recent Value  Chronic Disease   Chronic disease during today's visit Hypertension (HTN)  General Interventions   General Interventions Discussed/Reviewed General Interventions Discussed,  General Interventions Reviewed, Doctor Visits, Durable Medical Equipment (DME)  Doctor Visits Discussed/Reviewed Doctor Visits Discussed, Doctor Visits Reviewed, PCP  Durable Medical Equipment (DME) BP Cuff  Exercise Interventions   Exercise Discussed/Reviewed Exercise Discussed, Exercise Reviewed, Physical Activity  Physical Activity Discussed/Reviewed Physical Activity Reviewed, Physical Activity Discussed, Types of exercise  Education Interventions   Education Provided Provided Education  Provided Verbal Education On Medication, When to see the doctor, Nutrition, Exercise  Nutrition Interventions   Nutrition Discussed/Reviewed Nutrition Discussed, Nutrition Reviewed, Decreasing salt  Pharmacy Interventions   Pharmacy Dicussed/Reviewed Pharmacy Topics Discussed, Pharmacy Topics Reviewed, Medications and their functions            SDOH assessments and interventions completed:  No     Care Coordination Interventions:  Yes, provided   Follow up plan: Follow up call scheduled for 06/29/23 @2 :00 PM    Encounter Outcome:  Patient Visit Completed

## 2023-06-25 ENCOUNTER — Other Ambulatory Visit: Payer: Self-pay | Admitting: Internal Medicine

## 2023-06-25 DIAGNOSIS — I10 Essential (primary) hypertension: Secondary | ICD-10-CM

## 2023-06-29 ENCOUNTER — Ambulatory Visit: Payer: Self-pay

## 2023-06-29 NOTE — Patient Outreach (Signed)
  Care Coordination   Follow Up Visit Note   06/29/2023 Name: Jessica Crosby MRN: [id number] DOB: 11-Oct-1950  Jessica Crosby is a 73 y.o. year old female who sees Jarold Medici, MD for primary care. I spoke with  Jessica Crosby by phone today.  What matters to the patients health and wellness today?  Patient would like to keep her BP under better control to help avoid complications such as heart attack and or stroke.    Goals Addressed             This Visit's Progress    Monitor blood pressure at home more frequently   On track    Care Coordination Interventions: Evaluation of current treatment plan related to hypertension self management and patient's adherence to plan as established by provider  Provided education to patient re: stroke prevention, s/s of heart attack and stroke Advised patient, providing education and rationale, to monitor blood pressure daily and record, calling PCP for findings outside established parameters Instructed patient to monitor her BP at least 3 times weekly, checking morning, afternoon and evening blood pressures Educated patient about goal BP of 130/80 or less than and patient verbalizes understanding  Reviewed and discussed patient's next scheduled PCP follow up for BP check set for 07/06/23 @10 :40 PM Discussed plans with patient for ongoing care coordination follow up and provided patient with direct contact information for nurse care coordinator Last practice recorded BP readings:  BP Readings from Last 3 Encounters:  06/03/23 (!) 150/90  05/10/23 132/88  04/25/23 (!) 142/80   Most recent eGFR/CrCl:  Lab Results  Component Value Date   EGFR 65 04/25/2023    No components found for: CRCL       Interventions Today    Flowsheet Row Most Recent Value  Chronic Disease   Chronic disease during today's visit Hypertension (HTN)  General Interventions   General Interventions Discussed/Reviewed General Interventions  Discussed, General Interventions Reviewed, Doctor Visits, Durable Medical Equipment (DME)  Doctor Visits Discussed/Reviewed Doctor Visits Reviewed, Doctor Visits Discussed, PCP  Durable Medical Equipment (DME) BP Cuff  Exercise Interventions   Exercise Discussed/Reviewed Physical Activity, Exercise Reviewed, Exercise Discussed  Physical Activity Discussed/Reviewed Types of exercise, Physical Activity Reviewed, Physical Activity Discussed  Education Interventions   Education Provided Provided Education  Provided Verbal Education On Medication, Exercise, When to see the doctor  Pharmacy Interventions   Pharmacy Dicussed/Reviewed Pharmacy Topics Discussed, Pharmacy Topics Reviewed          SDOH assessments and interventions completed:  No     Care Coordination Interventions:  Yes, provided   Follow up plan: Follow up call scheduled for 07/27/23 @11 :00 AM    Encounter Outcome:  Patient Visit Completed

## 2023-06-29 NOTE — Patient Instructions (Addendum)
 Visit Information  Thank you for taking time to visit with me today. Please don't hesitate to contact me if I can be of assistance to you.   Following are the goals we discussed today:   Goals Addressed             This Visit's Progress    Monitor blood pressure at home more frequently   On track    Care Coordination Interventions: Evaluation of current treatment plan related to hypertension self management and patient's adherence to plan as established by provider  Provided education to patient re: stroke prevention, s/s of heart attack and stroke Advised patient, providing education and rationale, to monitor blood pressure daily and record, calling PCP for findings outside established parameters Instructed patient to monitor her BP at least 3 times weekly, checking morning, afternoon and evening blood pressures Educated patient about goal BP of 130/80 or less than and patient verbalizes understanding  Reviewed and discussed patient's next scheduled PCP follow up for BP check set for 07/06/23 @10 :40 PM Discussed plans with patient for ongoing care coordination follow up and provided patient with direct contact information for nurse care coordinator Last practice recorded BP readings:  BP Readings from Last 3 Encounters:  06/03/23 (!) 150/90  05/10/23 132/88  04/25/23 (!) 142/80   Most recent eGFR/CrCl:  Lab Results  Component Value Date   EGFR 65 04/25/2023    No components found for: CRCL           Our next appointment is by telephone on 07/27/23 at 11:00 AM  Please call the care guide team at [phone] if you need to cancel or reschedule your appointment.   If you are experiencing a Mental Health or Behavioral Health Crisis or need someone to talk to, please call 1-800-273-TALK (toll free, 24 hour hotline)  Patient verbalizes understanding of instructions and care plan provided today and agrees to view in MyChart. Active MyChart status and patient understanding of  how to access instructions and care plan via MyChart confirmed with patient.     Clayborne Ly RN BSN CCM [place]  [facility], [facility] Health Nurse Care Coordinator  Direct Dial: [phone] Website: [email or web address]

## 2023-07-06 ENCOUNTER — Ambulatory Visit: Payer: PPO | Admitting: Internal Medicine

## 2023-07-06 ENCOUNTER — Encounter: Payer: Self-pay | Admitting: Internal Medicine

## 2023-07-06 VITALS — BP 118/74 | Temp 97.9°F | Ht 64.0 in | Wt 161.0 lb

## 2023-07-06 DIAGNOSIS — I10 Essential (primary) hypertension: Secondary | ICD-10-CM | POA: Diagnosis not present

## 2023-07-06 DIAGNOSIS — E663 Overweight: Secondary | ICD-10-CM

## 2023-07-06 DIAGNOSIS — M8580 Other specified disorders of bone density and structure, unspecified site: Secondary | ICD-10-CM

## 2023-07-06 DIAGNOSIS — E7800 Pure hypercholesterolemia, unspecified: Secondary | ICD-10-CM

## 2023-07-06 DIAGNOSIS — M85852 Other specified disorders of bone density and structure, left thigh: Secondary | ICD-10-CM

## 2023-07-06 DIAGNOSIS — Z6827 Body mass index (BMI) 27.0-27.9, adult: Secondary | ICD-10-CM | POA: Diagnosis not present

## 2023-07-06 MED ORDER — ESCITALOPRAM OXALATE 10 MG PO TABS: 10.0000 mg | ORAL_TABLET | Freq: Every day | ORAL | 1 refills | Status: DC

## 2023-07-06 NOTE — Progress Notes (Signed)
I,Jessica Crosby, CMA,acting as a Neurosurgeon for Jessica Aliment, MD.,have documented all relevant documentation on the behalf of Jessica Aliment, MD,as directed by  Jessica Aliment, MD while in the presence of Jessica Aliment, MD.  Subjective:  Patient ID: Jessica Crosby , female    DOB: 04-Jun-1951 , 73 y.o.   MRN: [id number]  Chief Complaint  Patient presents with   Hypertension   Hyperlipidemia    HPI  Patient is here for HTN & cholesterol follow up. She reports compliance with meds.  She denies having any headaches, chest pain and shortness of breath.  She has no specific concerns or complaints at this time.   Hypertension This is a chronic problem. The current episode started more than 1 year ago. The problem has been gradually improving since onset. The problem is controlled. Pertinent negatives include no blurred vision, chest pain, palpitations or shortness of breath. Risk factors for coronary artery disease include dyslipidemia and post-menopausal state. Past treatments include angiotensin blockers and diuretics. Compliance problems include exercise.   Hyperlipidemia Pertinent negatives include no chest pain or shortness of breath. The current treatment provides moderate improvement of lipids. There are no compliance problems.  Risk factors for coronary artery disease include dyslipidemia and post-menopausal.     Past Medical History:  Diagnosis Date   Arthritis    GERD (gastroesophageal reflux disease)    Hx of scarlet fever    as achild   Hypertension    Thyroid disease    hyperparathyroidism   Vitamin D deficiency      Family History  Problem Relation Age of Onset   Breast cancer Sister 48   Prostate cancer Father 53   Breast cancer Maternal Aunt 81   Prostate cancer Paternal Uncle    Throat cancer Paternal Grandfather        pipe smoker   Breast cancer Sister 43   Lymphoma Sister 60   Leukemia Sister 65       as a result of chemotherapy from her  lymphoma   Cancer Sister 72       non hodgins lymphoma/ leukemia   Pancreatic cancer Maternal Aunt 49   Colon cancer Maternal Aunt        diagnosed in her 70s   Thyroid cancer Maternal Aunt        dx in her 19s   Leukemia Maternal Aunt        diagnosed in her 26s   Breast cancer Cousin        diagnosed in her 30s   Prostate cancer Paternal Uncle      Current Outpatient Medications:    amLODipine (NORVASC) 5 MG tablet, TAKE 1 TABLET BY MOUTH DAILY AT NIGHT, Disp: 90 tablet, Rfl: 1   aspirin 81 MG tablet, Take 81 mg by mouth daily., Disp: , Rfl:    atorvastatin (LIPITOR) 20 MG tablet, ONE TAB PO MWF, Disp: 45 tablet, Rfl: 2   calcium carbonate (OS-CAL) 600 MG TABS, Take 600 mg by mouth 2 (two) times daily with a meal., Disp: , Rfl:    Cholecalciferol (VITAMIN D PO), Take 2,000 mg by mouth daily. , Disp: , Rfl:    estradiol (ESTRACE) 0.1 MG/GM vaginal cream, Apply one gram vaginally twice weekly., Disp: 42.5 g, Rfl: 1   guaiFENesin (MUCINEX) 600 MG 12 hr tablet, Take 600 mg by mouth 2 (two) times daily., Disp: , Rfl:    losartan-hydrochlorothiazide (HYZAAR) 100-12.5 MG tablet, Take 1 tablet by  mouth daily., Disp: 90 tablet, Rfl: 2   Magnesium 250 MG TABS, Take by mouth., Disp: , Rfl:    Multiple Vitamin (THERA) TABS, Take 1 tablet by mouth daily., Disp: , Rfl:    Omega-3 Fatty Acids (FISH OIL) 1000 MG CAPS, Take 1 capsule by mouth., Disp: , Rfl:    escitalopram (LEXAPRO) 10 MG tablet, Take 1 tablet (10 mg total) by mouth daily., Disp: 90 tablet, Rfl: 1   Allergies  Allergen Reactions   Ace Inhibitors Cough     Review of Systems  Constitutional: Negative.   Eyes:  Negative for blurred vision.  Respiratory: Negative.  Negative for shortness of breath.   Cardiovascular: Negative.  Negative for chest pain and palpitations.  Gastrointestinal: Negative.   Neurological: Negative.   Psychiatric/Behavioral: Negative.       Today's Vitals   07/06/23 1020  BP: 118/74  Temp: 97.9 F  (36.6 C)  SpO2: 98%  Weight: 161 lb (73 kg)  Height: 5\' 4"  (1.626 m)   Body mass index is 27.64 kg/m.  Wt Readings from Last 3 Encounters:  07/06/23 161 lb (73 kg)  06/03/23 161 lb (73 kg)  05/10/23 161 lb (73 kg)   BP Readings from Last 3 Encounters:  07/06/23 118/74  06/03/23 (!) 150/90  05/10/23 132/88       Objective:  Physical Exam Vitals and nursing note reviewed.  Constitutional:      Appearance: Normal appearance.  HENT:     Head: Normocephalic and atraumatic.  Eyes:     Extraocular Movements: Extraocular movements intact.  Cardiovascular:     Rate and Rhythm: Normal rate and regular rhythm.     Heart sounds: Normal heart sounds.  Pulmonary:     Effort: Pulmonary effort is normal.     Breath sounds: Normal breath sounds.  Musculoskeletal:     Cervical back: Normal range of motion.  Skin:    General: Skin is warm.  Neurological:     General: No focal deficit present.     Mental Status: She is alert.  Psychiatric:        Mood and Affect: Mood normal.        Behavior: Behavior normal.         Assessment And Plan:  Essential hypertension Assessment & Plan: Chronic, well controlled.  She will continue with losartan/hct 100/12.5mg  daily and amlodipine 5mg  daily. She will f/u in four to six months.     Pure hypercholesterolemia Assessment & Plan: Chronic, she will continue with atorvastatin 20mg  MWF. Calcium score was ZERO in 2023.  She is encouraged to follow heart healthy lifestyle. She is encouraged to aim for at least 150 minutes of exercise per week.    Osteopenia of neck of left femur Assessment & Plan: Last bone density performed December 2024.  She is encouraged to engage in weight-bearing exercises at least three days weekly and to continue with calcium/vitamin D supplementation. We will repeat study in December 2026.    Overweight with body mass index (BMI) of 27 to 27.9 in adult Assessment & Plan: Her BMI is acceptable for her  demographic.  She is encouraged to aim for at least 150 minutes of exercise per week.    Other orders -     Escitalopram Oxalate; Take 1 tablet (10 mg total) by mouth daily.  Dispense: 90 tablet; Refill: 1     Return in 6 weeks (on 08/17/2023), or lexapro f/u.  Patient was given opportunity to ask questions. Patient  verbalized understanding of the plan and was able to repeat key elements of the plan. All questions were answered to their satisfaction.    I, Jessica Aliment, MD, have reviewed all documentation for this visit. The documentation on 07/06/23 for the exam, diagnosis, procedures, and orders are all accurate and complete.   IF YOU HAVE BEEN REFERRED TO A SPECIALIST, IT MAY TAKE 1-2 WEEKS TO SCHEDULE/PROCESS THE REFERRAL. IF YOU HAVE NOT HEARD FROM US/SPECIALIST IN TWO WEEKS, PLEASE GIVE [place] A CALL AT [phone] X [phone].   THE PATIENT IS ENCOURAGED TO PRACTICE SOCIAL DISTANCING DUE TO THE COVID-19 PANDEMIC.

## 2023-07-06 NOTE — Patient Instructions (Signed)
 Hypertension, Adult Hypertension is another name for high blood pressure. High blood pressure forces your heart to work harder to pump blood. This can cause problems over time. There are two numbers in a blood pressure reading. There is a top number (systolic) over a bottom number (diastolic). It is best to have a blood pressure that is below 120/80. What are the causes? The cause of this condition is not known. Some other conditions can lead to high blood pressure. What increases the risk? Some lifestyle factors can make you more likely to develop high blood pressure: Smoking. Not getting enough exercise or physical activity. Being overweight. Having too much fat, sugar, calories, or salt (sodium) in your diet. Drinking too much alcohol. Other risk factors include: Having any of these conditions: Heart disease. Diabetes. High cholesterol. Kidney disease. Obstructive sleep apnea. Having a family history of high blood pressure and high cholesterol. Age. The risk increases with age. Stress. What are the signs or symptoms? High blood pressure may not cause symptoms. Very high blood pressure (hypertensive crisis) may cause: Headache. Fast or uneven heartbeats (palpitations). Shortness of breath. Nosebleed. Vomiting or feeling like you may vomit (nauseous). Changes in how you see. Very bad chest pain. Feeling dizzy. Seizures. How is this treated? This condition is treated by making healthy lifestyle changes, such as: Eating healthy foods. Exercising more. Drinking less alcohol. Your doctor may prescribe medicine if lifestyle changes do not help enough and if: Your top number is above 130. Your bottom number is above 80. Your personal target blood pressure may vary. Follow these instructions at home: Eating and drinking  If told, follow the DASH eating plan. To follow this plan: Fill one half of your plate at each meal with fruits and vegetables. Fill one fourth of your plate  at each meal with whole grains. Whole grains include whole-wheat pasta, brown rice, and whole-grain bread. Eat or drink low-fat dairy products, such as skim milk or low-fat yogurt. Fill one fourth of your plate at each meal with low-fat (lean) proteins. Low-fat proteins include fish, chicken without skin, eggs, beans, and tofu. Avoid fatty meat, cured and processed meat, or chicken with skin. Avoid pre-made or processed food. Limit the amount of salt in your diet to less than 1,500 mg each day. Do not drink alcohol if: Your doctor tells you not to drink. You are pregnant, may be pregnant, or are planning to become pregnant. If you drink alcohol: Limit how much you have to: 0-1 drink a day for women. 0-2 drinks a day for men. Know how much alcohol is in your drink. In the [place] one drink equals one 12 oz bottle of beer (355 mL), one 5 oz glass of wine (148 mL), or one 1 oz glass of hard liquor (44 mL). Lifestyle  Work with your doctor to stay at a healthy weight or to lose weight. Ask your doctor what the best weight is for you. Get at least 30 minutes of exercise that causes your heart to beat faster (aerobic exercise) most days of the week. This may include walking, swimming, or biking. Get at least 30 minutes of exercise that strengthens your muscles (resistance exercise) at least 3 days a week. This may include lifting weights or doing Pilates. Do not smoke or use any products that contain nicotine or tobacco. If you need help quitting, ask your doctor. Check your blood pressure at home as told by your doctor. Keep all follow-up visits. Medicines Take over-the-counter and prescription medicines  only as told by your doctor. Follow directions carefully. Do not skip doses of blood pressure medicine. The medicine does not work as well if you skip doses. Skipping doses also puts you at risk for problems. Ask your doctor about side effects or reactions to medicines that you should watch  for. Contact a doctor if: You think you are having a reaction to the medicine you are taking. You have headaches that keep coming back. You feel dizzy. You have swelling in your ankles. You have trouble with your vision. Get help right away if: You get a very bad headache. You start to feel mixed up (confused). You feel weak or numb. You feel faint. You have very bad pain in your: Chest. Belly (abdomen). You vomit more than once. You have trouble breathing. These symptoms may be an emergency. Get help right away. Call 911. Do not wait to see if the symptoms will go away. Do not drive yourself to the hospital. Summary Hypertension is another name for high blood pressure. High blood pressure forces your heart to work harder to pump blood. For most people, a normal blood pressure is less than 120/80. Making healthy choices can help lower blood pressure. If your blood pressure does not get lower with healthy choices, you may need to take medicine. This information is not intended to replace advice given to you by your health care provider. Make sure you discuss any questions you have with your health care provider. Document Revised: 03/26/2021 Document Reviewed: 03/26/2021 Elsevier Patient Education  2024 [employer].

## 2023-07-11 DIAGNOSIS — Z6827 Body mass index (BMI) 27.0-27.9, adult: Secondary | ICD-10-CM | POA: Insufficient documentation

## 2023-07-11 NOTE — Assessment & Plan Note (Signed)
Her BMI is acceptable for her demographic. She is encouraged to aim for at least 150 minutes of exercise per week.

## 2023-07-11 NOTE — Assessment & Plan Note (Signed)
Last bone density performed December 2024.  She is encouraged to engage in weight-bearing exercises at least three days weekly and to continue with calcium/vitamin D supplementation. We will repeat study in December 2026.

## 2023-07-11 NOTE — Assessment & Plan Note (Signed)
Chronic, well controlled.  She will continue with losartan/hct 100/12.5mg  daily and amlodipine 5mg  daily. She will f/u in four to six months.

## 2023-07-11 NOTE — Assessment & Plan Note (Signed)
Chronic, she will continue with atorvastatin 20mg  MWF. Calcium score was ZERO in 2023.  She is encouraged to follow heart healthy lifestyle. She is encouraged to aim for at least 150 minutes of exercise per week.

## 2023-07-19 ENCOUNTER — Telehealth: Payer: Self-pay | Admitting: *Deleted

## 2023-07-19 DIAGNOSIS — M25511 Pain in right shoulder: Secondary | ICD-10-CM | POA: Diagnosis not present

## 2023-07-19 DIAGNOSIS — M67911 Unspecified disorder of synovium and tendon, right shoulder: Secondary | ICD-10-CM | POA: Diagnosis not present

## 2023-07-19 NOTE — Progress Notes (Signed)
Complex Care Management Care Guide Note  07/19/2023 Name: Jessica Crosby MRN: [id number] DOB: 1951-02-20  Jessica Crosby is a 73 y.o. year old female who is a primary care patient of Dorothyann Peng, MD and is actively engaged with the care management team. I reached out to [name] by phone today to assist with re-scheduling  with the RN Case Manager.  Follow up plan: Unsuccessful telephone outreach attempt made. A HIPAA compliant phone message was left for the patient providing contact information and requesting a return call.  Gwenevere Ghazi  [facility]  [facility], [facility] Guide  Direct Dial: [phone]  Fax [phone]

## 2023-07-19 NOTE — Progress Notes (Signed)
Complex Care Management Care Guide Note  07/19/2023 Name: Jessica Crosby MRN: [id number] DOB: 30-Mar-1951  Jessica Crosby is a 72 y.o. year old female who is a primary care patient of Dorothyann Peng, MD and is actively engaged with the care management team. I reached out to [name] by phone today to assist with re-scheduling  with the RN Case Manager.  Follow up plan: Telephone appointment with complex care management team member scheduled for:  07/25/23  Gwenevere Ghazi  [facility]  [facility], [facility] Guide  Direct Dial: [phone]  Fax [phone]

## 2023-07-25 ENCOUNTER — Ambulatory Visit: Payer: Self-pay

## 2023-07-26 NOTE — Patient Instructions (Signed)
Visit Information  Thank you for taking time to visit with me today. Please don't hesitate to contact me if I can be of assistance to you.   Following are the goals we discussed today:   Goals Addressed             This Visit's Progress    Monitor blood pressure at home more frequently   On track    Care Coordination Interventions: Evaluation of current treatment plan related to hypertension self management and patient's adherence to plan as established by provider  Instructed patient to monitor her BP at least 3 times weekly, checking morning, afternoon and evening blood pressures and record readings; Educated patient about goal BP of 130/80 or less than and patient verbalizes understanding  Instructed patient to keep her doctor well informed of readings outside of established parameters and patient verbalizes understanding  Mailed printed educational materials related to How to Accurately Monitor BP at Home  Discussed plans with patient for ongoing care coordination follow up and provided patient with direct contact information for nurse care coordinator Last practice recorded BP readings:  BP Readings from Last 3 Encounters:  07/06/23 118/74  06/03/23 (!) 150/90  05/10/23 132/88   Most recent eGFR/CrCl:  Lab Results  Component Value Date   EGFR 65 04/25/2023    No components found for: "CRCL"        Reduce stress and anxiety       Care Coordination Interventions: Evaluation of current treatment plan related to anxiety  and patient's adherence to plan as established by provider Reviewed and discussed patient's recent start of Lexapro prescribed by her PCP Determined patient experienced bothersome SE including extreme dry mouth, dizziness and headaches, patient states the medication did have some calming affects, she would like to discuss with her PCP switching to an alternate medication  Determined patient would also like advice from PCP regarding supplements to help with  brittle hair and nails Sent in basket message to Dr. Allyne Gee regarding patient's concerns, received a reply back with the following recommendations;  She can switch to hair/skin/nails vitamin and incorporate collagen into her diet. I can check labs at her next visit.  Please have her try 1/2 tablet nightly. She may tolerate lower dose better. Please ask her to use pill cutter.  Placed successful outbound call to patient to advise of PCP recommendations  Reviewed and discussed PCP follow up for anxiety/Lexapro scheduled for 08/17/23 @3 :20 PM Discussed plans with patient for ongoing care coordination follow up and provided patient with direct contact information for nurse care coordinator           Our next appointment is by telephone on 08/19/23 at 11:00 AM  Please call the care guide team at [phone] if you need to cancel or reschedule your appointment.   If you are experiencing a Mental Health or Behavioral Health Crisis or need someone to talk to, please call 1-800-273-TALK (toll free, 24 hour hotline)  Patient verbalizes understanding of instructions and care plan provided today and agrees to view in MyChart. Active MyChart status and patient understanding of how to access instructions and care plan via MyChart confirmed with patient.     Delsa Sale RN BSN CCM [place]  [facility], [facility] Nurse Care Coordinator  Direct Dial: [phone] Website: [email or web address]

## 2023-07-26 NOTE — Patient Outreach (Signed)
Care Coordination   Follow Up Visit Note   07/25/2023 Name: Jessica Crosby MRN: [id number] DOB: 1950/08/15  Jessica Crosby is a 73 y.o. year old female who sees Dorothyann Peng, MD for primary care. I spoke with  [name] by phone today.  What matters to the patients health and wellness today?  Patient would like to improve self-health management of her stress and anxiety.     Goals Addressed             This Visit's Progress    Monitor blood pressure at home more frequently   On track    Care Coordination Interventions: Evaluation of current treatment plan related to hypertension self management and patient's adherence to plan as established by provider  Instructed patient to monitor her BP at least 3 times weekly, checking morning, afternoon and evening blood pressures and record readings; Educated patient about goal BP of 130/80 or less than and patient verbalizes understanding  Instructed patient to keep her doctor well informed of readings outside of established parameters and patient verbalizes understanding  Mailed printed educational materials related to How to Accurately Monitor BP at Home  Discussed plans with patient for ongoing care coordination follow up and provided patient with direct contact information for nurse care coordinator Last practice recorded BP readings:  BP Readings from Last 3 Encounters:  07/06/23 118/74  06/03/23 (!) 150/90  05/10/23 132/88   Most recent eGFR/CrCl:  Lab Results  Component Value Date   EGFR 65 04/25/2023    No components found for: "CRCL"     Reduce stress and anxiety       Care Coordination Interventions: Evaluation of current treatment plan related to anxiety  and patient's adherence to plan as established by provider Reviewed and discussed patient's recent start of Lexapro prescribed by her PCP Determined patient experienced bothersome SE including extreme dry mouth, dizziness and headaches, patient  states the medication did have some calming affects, she would like to discuss with her PCP switching to an alternate medication  Determined patient would also like advice from PCP regarding supplements to help with brittle hair and nails Sent in basket message to Dr. Allyne Gee regarding patient's concerns, received a reply back with the following recommendations;  She can switch to hair/skin/nails vitamin and incorporate collagen into her diet. I can check labs at her next visit.  Please have her try 1/2 tablet nightly. She may tolerate lower dose better. Please ask her to use pill cutter.  Placed successful outbound call to patient to advise of PCP recommendations  Reviewed and discussed PCP follow up for anxiety/Lexapro scheduled for 08/17/23 @3 :20 PM Discussed plans with patient for ongoing care coordination follow up and provided patient with direct contact information for nurse care coordinator    Interventions Today    Flowsheet Row Most Recent Value  Chronic Disease   Chronic disease during today's visit Hypertension (HTN), Chronic Obstructive Pulmonary Disease (COPD)  General Interventions   General Interventions Discussed/Reviewed General Interventions Discussed, General Interventions Reviewed, Doctor Visits, Durable Medical Equipment (DME), Communication with  Doctor Visits Discussed/Reviewed Doctor Visits Discussed, Doctor Visits Reviewed, PCP  Durable Medical Equipment (DME) BP Cuff  Communication with PCP/Specialists  [Dr. Sanders]  Education Interventions   Education Provided Provided Education  Provided Verbal Education On Medication, When to see the doctor, Mental Health/Coping with Illness  Mental Health Interventions   Mental Health Discussed/Reviewed Mental Health Reviewed, Mental Health Discussed, Anxiety  Nutrition Interventions   Nutrition Discussed/Reviewed  Nutrition Reviewed, Nutrition Discussed  Pharmacy Interventions   Pharmacy Dicussed/Reviewed Pharmacy Topics  Discussed, Pharmacy Topics Reviewed          SDOH assessments and interventions completed:  Yes  SDOH Interventions Today    Flowsheet Row Most Recent Value  SDOH Interventions   Food Insecurity Interventions Intervention Not Indicated  Housing Interventions Intervention Not Indicated  Transportation Interventions Intervention Not Indicated  Utilities Interventions Intervention Not Indicated        Care Coordination Interventions:  Yes, provided   Follow up plan: Follow up call scheduled for 08/19/23 @11 :00 AM    Encounter Outcome:  Patient Visit Completed

## 2023-08-10 ENCOUNTER — Encounter: Payer: PPO | Admitting: Radiology

## 2023-08-17 ENCOUNTER — Encounter: Payer: Self-pay | Admitting: Internal Medicine

## 2023-08-17 ENCOUNTER — Ambulatory Visit (INDEPENDENT_AMBULATORY_CARE_PROVIDER_SITE_OTHER): Payer: PPO | Admitting: Internal Medicine

## 2023-08-17 VITALS — BP 130/70 | HR 81 | Temp 98.7°F | Ht 64.0 in | Wt 159.0 lb

## 2023-08-17 DIAGNOSIS — F418 Other specified anxiety disorders: Secondary | ICD-10-CM | POA: Diagnosis not present

## 2023-08-17 DIAGNOSIS — R7989 Other specified abnormal findings of blood chemistry: Secondary | ICD-10-CM

## 2023-08-17 DIAGNOSIS — I10 Essential (primary) hypertension: Secondary | ICD-10-CM

## 2023-08-17 DIAGNOSIS — R7309 Other abnormal glucose: Secondary | ICD-10-CM

## 2023-08-17 MED ORDER — ESCITALOPRAM OXALATE 10 MG PO TABS: ORAL_TABLET | ORAL | 1 refills | Status: DC

## 2023-08-17 NOTE — Progress Notes (Signed)
 I,[name], CMA,acting as a Neurosurgeon for [name], MD.,have documented all relevant documentation on the behalf of [name], MD,as directed by  [name], MD while in the presence of [name], MD.  Subjective:  Patient ID: Jessica Crosby , female    DOB: 01-23-1951 , 73 y.o.   MRN: [id number]  Chief Complaint  Patient presents with   Med Check     HPI  Patient presents today for Lexapro follow up. She reports compliance with medication. She states taking half tablet which works better for her. She has no other specific questions or concerns.      Past Medical History:  Diagnosis Date   Arthritis    GERD (gastroesophageal reflux disease)    Hx of scarlet fever    as achild   Hypertension    Thyroid disease    hyperparathyroidism   Vitamin D deficiency      Family History  Problem Relation Age of Onset   Breast cancer Sister 12   Prostate cancer Father 73   Breast cancer Maternal Aunt 81   Prostate cancer Paternal Uncle    Throat cancer Paternal Grandfather        pipe smoker   Breast cancer Sister 4   Lymphoma Sister 48   Leukemia Sister 42       as a result of chemotherapy from her lymphoma   Cancer Sister 25       non hodgins lymphoma/ leukemia   Pancreatic cancer Maternal Aunt 69   Colon cancer Maternal Aunt        diagnosed in her 23s   Thyroid cancer Maternal Aunt        dx in her 34s   Leukemia Maternal Aunt        diagnosed in her 67s   Breast cancer Cousin        diagnosed in her 33s   Prostate cancer Paternal Uncle      Current Outpatient Medications:    amLODipine (NORVASC) 5 MG tablet, TAKE 1 TABLET BY MOUTH DAILY AT NIGHT, Disp: 90 tablet, Rfl: 1   aspirin 81 MG tablet, Take 81 mg by mouth daily., Disp: , Rfl:    atorvastatin (LIPITOR) 20 MG tablet, ONE TAB PO MWF, Disp: 45 tablet, Rfl: 2   calcium carbonate (OS-CAL) 600 MG TABS, Take 600 mg by mouth 2 (two) times daily with a meal., Disp: , Rfl:     Cholecalciferol (VITAMIN D PO), Take 2,000 mg by mouth daily. , Disp: , Rfl:    estradiol (ESTRACE) 0.1 MG/GM vaginal cream, Apply one gram vaginally twice weekly., Disp: 42.5 g, Rfl: 1   guaiFENesin (MUCINEX) 600 MG 12 hr tablet, Take 600 mg by mouth 2 (two) times daily., Disp: , Rfl:    losartan-hydrochlorothiazide (HYZAAR) 100-12.5 MG tablet, Take 1 tablet by mouth daily., Disp: 90 tablet, Rfl: 2   Magnesium 250 MG TABS, Take by mouth., Disp: , Rfl:    Multiple Vitamin (THERA) TABS, Take 1 tablet by mouth daily., Disp: , Rfl:    Omega-3 Fatty Acids (FISH OIL) 1000 MG CAPS, Take 1 capsule by mouth., Disp: , Rfl:    escitalopram (LEXAPRO) 10 MG tablet, Take 1/2 tablet po daily, Disp: 45 tablet, Rfl: 1   Allergies  Allergen Reactions   Ace Inhibitors Cough     Review of Systems  Constitutional: Negative.   Respiratory: Negative.    Cardiovascular: Negative.   Gastrointestinal: Negative.  Neurological: Negative.   Psychiatric/Behavioral: Negative.       Today's Vitals   08/17/23 1511  BP: 130/70  Pulse: 81  Temp: 98.7 F (37.1 C)  SpO2: 98%  Weight: 159 lb (72.1 kg)  Height: 5\' 4"  (1.626 m)   Body mass index is 27.29 kg/m.  Wt Readings from Last 3 Encounters:  08/17/23 159 lb (72.1 kg)  07/06/23 161 lb (73 kg)  06/03/23 161 lb (73 kg)    BP Readings from Last 3 Encounters:  08/17/23 130/70  07/06/23 118/74  06/03/23 (!) 150/90      Objective:  Physical Exam Vitals and nursing note reviewed.  Constitutional:      Appearance: Normal appearance.  HENT:     Head: Normocephalic and atraumatic.  Eyes:     Extraocular Movements: Extraocular movements intact.  Cardiovascular:     Rate and Rhythm: Normal rate and regular rhythm.     Heart sounds: Normal heart sounds.  Pulmonary:     Effort: Pulmonary effort is normal.     Breath sounds: Normal breath sounds.  Musculoskeletal:     Cervical back: Normal range of motion.  Skin:    General: Skin is warm.   Neurological:     General: No focal deficit present.     Mental Status: She is alert.  Psychiatric:        Mood and Affect: Mood normal.        Behavior: Behavior normal.         Assessment And Plan:  Situational anxiety Assessment & Plan: Chronic, she will continue with escitalopram, 10mg  1/2 tablet daily.  She will f/u in four to six months for re-evaluation.    Essential hypertension Assessment & Plan: Chronic, fair control.  She will continue with losartan/hct 100/12.5mg  and amlodipine 5mg  daily. She will f/u in four to six months.  She is reminded to follow low sodium diet.    Elevated liver function tests -     Hepatic function panel  Other abnormal glucose Assessment & Plan: Previous labs reviewed, her A1c has been elevated in the past. I will check an A1c today. Reminded to avoid refined sugars including sugary drinks/foods and processed meats including bacon, sausages and deli meats.    Orders: -     Hemoglobin A1c  Other orders -     Escitalopram Oxalate; Take 1/2 tablet po daily  Dispense: 45 tablet; Refill: 1     Return if symptoms worsen or fail to improve.  Patient was given opportunity to ask questions. Patient verbalized understanding of the plan and was able to repeat key elements of the plan. All questions were answered to their satisfaction.    I, [name], MD, have reviewed all documentation for this visit. The documentation on 08/17/23 for the exam, diagnosis, procedures, and orders are all accurate and complete.   IF YOU HAVE BEEN REFERRED TO A SPECIALIST, IT MAY TAKE 1-2 WEEKS TO SCHEDULE/PROCESS THE REFERRAL. IF YOU HAVE NOT HEARD FROM US/SPECIALIST IN TWO WEEKS, PLEASE GIVE [place] A CALL AT [phone] X [phone].   THE PATIENT IS ENCOURAGED TO PRACTICE SOCIAL DISTANCING DUE TO THE COVID-19 PANDEMIC.

## 2023-08-18 LAB — HEMOGLOBIN A1C
Est. average glucose Bld gHb Est-mCnc: 111 mg/dL
Hgb A1c MFr Bld: 5.5 % (ref 4.8–5.6)

## 2023-08-18 LAB — HEPATIC FUNCTION PANEL
ALT: 25 IU/L (ref 0–32)
AST: 18 IU/L (ref 0–40)
Albumin: 4.3 g/dL (ref 3.8–4.8)
Alkaline Phosphatase: 88 IU/L (ref 44–121)
Bilirubin Total: 0.6 mg/dL (ref 0.0–1.2)
Bilirubin, Direct: 0.24 mg/dL (ref 0.00–0.40)
Total Protein: 6.2 g/dL (ref 6.0–8.5)

## 2023-08-19 ENCOUNTER — Ambulatory Visit: Payer: Self-pay

## 2023-08-19 NOTE — Patient Instructions (Signed)
 Visit Information  Thank you for taking time to visit with me today. Please don't hesitate to contact me if I can be of assistance to you.   Following are the goals we discussed today:   Goals Addressed             This Visit's Progress    Monitor blood pressure at home more frequently   On track    Care Coordination Interventions: Evaluation of current treatment plan related to hypertension self management and patient's adherence to plan as established by provider Reviewed medications with patient and discussed importance of compliance Counseled on the importance of exercise goals with target of 150 minutes per week Provided education on prescribed diet low Sodium Discussed plans with patient for ongoing care coordination follow up and provided patient with direct contact information for nurse care coordinator Last practice recorded BP readings:  BP Readings from Last 3 Encounters:  08/17/23 130/70  07/06/23 118/74  06/03/23 (!) 150/90   Most recent eGFR/CrCl:  Lab Results  Component Value Date   EGFR 65 04/25/2023    No components found for: "CRCL"      Reduce stress and anxiety   On track    Care Coordination Interventions: Evaluation of current treatment plan related to anxiety  and patient's adherence to plan as established by provider Reviewed and discussed with patient her recent PCP follow up for evaluation of situational depression  Patient reports adherence with taking Lexapro, 1/2 tablet daily without noted SE  Educated patient regarding titration off this medication per PCP guidelines if she feels she would like to to discontinue this medication in the future and patient verbalizes understanding Instructed patient to notify her PCP of new symptoms or concerns Reviewed and discussed patient's next PCP follow up is scheduled for 10/24/23 @09 :40 AM Discussed plans with patient for ongoing care coordination follow up and provided patient with direct contact information  for nurse care coordinator      COMPLETED: Reduce sugar intake to help lower A1c       Care Coordination Interventions: Evaluation of current treatment plan related to prediabetes and patient's adherence to plan as established by provider  Review of patient status, including review of consultants reports, relevant laboratory and other test results, and medications completed Positive reinforcement given to patient for making efforts to improve her health Lab Results  Component Value Date   HGBA1C 5.5 08/17/2023           Our next appointment is by telephone on 10/26/23 at 2:00 PM  Please call the care guide team at [phone] if you need to cancel or reschedule your appointment.   If you are experiencing a Mental Health or Behavioral Health Crisis or need someone to talk to, please call 1-800-273-TALK (toll free, 24 hour hotline)  Patient verbalizes understanding of instructions and care plan provided today and agrees to view in MyChart. Active MyChart status and patient understanding of how to access instructions and care plan via MyChart confirmed with patient.     Delsa Sale RN BSN CCM [place]  [facility], [facility] Nurse Care Coordinator  Direct Dial: [phone] Website: [email or web address]

## 2023-08-19 NOTE — Patient Outreach (Signed)
 Care Coordination   Follow Up Visit Note   08/19/2023 Name: Jessica Crosby MRN: [id number] DOB: 1951-05-26  Jessica Crosby is a 73 y.o. year old female who sees Jessica Peng, MD for primary care. I spoke with  Jessica Crosby by phone today.  What matters to the patients health and wellness today?  Patient would like to continue managing her chronic health conditions.     Goals Addressed             This Visit's Progress    Monitor blood pressure at home more frequently   On track    Care Coordination Interventions: Evaluation of current treatment plan related to hypertension self management and patient's adherence to plan as established by provider Reviewed medications with patient and discussed importance of compliance Counseled on the importance of exercise goals with target of 150 minutes per week Provided education on prescribed diet low Sodium Discussed plans with patient for ongoing care coordination follow up and provided patient with direct contact information for nurse care coordinator Last practice recorded BP readings:  BP Readings from Last 3 Encounters:  08/17/23 130/70  07/06/23 118/74  06/03/23 (!) 150/90   Most recent eGFR/CrCl:  Lab Results  Component Value Date   EGFR 65 04/25/2023    No components found for: "CRCL"      Reduce stress and anxiety   On track    Care Coordination Interventions: Evaluation of current treatment plan related to anxiety  and patient's adherence to plan as established by provider Reviewed and discussed with patient her recent PCP follow up for evaluation of situational depression  Patient reports adherence with taking Lexapro, 1/2 tablet daily without noted SE  Educated patient regarding titration off this medication per PCP guidelines if she feels she would like to to discontinue this medication in the future and patient verbalizes understanding Instructed patient to notify her PCP of new symptoms or  concerns Reviewed and discussed patient's next PCP follow up is scheduled for 10/24/23 @09 :40 AM Discussed plans with patient for ongoing care coordination follow up and provided patient with direct contact information for nurse care coordinator      COMPLETED: Reduce sugar intake to help lower A1c       Care Coordination Interventions: Evaluation of current treatment plan related to prediabetes and patient's adherence to plan as established by provider  Review of patient status, including review of consultants reports, relevant laboratory and other test results, and medications completed Positive reinforcement given to patient for making efforts to improve her health Lab Results  Component Value Date   HGBA1C 5.5 08/17/2023       Interventions Today    Flowsheet Row Most Recent Value  Chronic Disease   Chronic disease during today's visit Hypertension (HTN), Other  [situational depression]  General Interventions   General Interventions Discussed/Reviewed General Interventions Discussed, General Interventions Reviewed, Doctor Visits, Labs  Doctor Visits Discussed/Reviewed Doctor Visits Discussed, Doctor Visits Reviewed, PCP  Exercise Interventions   Exercise Discussed/Reviewed Physical Activity, Exercise Reviewed, Exercise Discussed  Physical Activity Discussed/Reviewed Physical Activity Discussed, Physical Activity Reviewed, Types of exercise, Gym  Education Interventions   Education Provided Provided Education  Provided Verbal Education On Labs, When to see the doctor, Medication, Exercise  Labs Reviewed Hgb A1c  [liver enzymes]  Mental Health Interventions   Mental Health Discussed/Reviewed Mental Health Discussed, Mental Health Reviewed, Anxiety, Depression  Pharmacy Interventions   Pharmacy Dicussed/Reviewed Pharmacy Topics Discussed, Pharmacy Topics Reviewed, Medications and their functions  SDOH assessments and interventions completed:  Yes     Care  Coordination Interventions:  Yes, provided   Follow up plan: Follow up call scheduled for 10/26/23 @2 :00 PM    Encounter Outcome:  Patient Visit Completed

## 2023-08-21 DIAGNOSIS — R7989 Other specified abnormal findings of blood chemistry: Secondary | ICD-10-CM | POA: Insufficient documentation

## 2023-08-21 DIAGNOSIS — R7309 Other abnormal glucose: Secondary | ICD-10-CM | POA: Insufficient documentation

## 2023-08-21 NOTE — Assessment & Plan Note (Signed)
 Chronic, fair control.  She will continue with losartan/hct 100/12.5mg  and amlodipine 5mg  daily. She will f/u in four to six months.  She is reminded to follow low sodium diet.

## 2023-08-21 NOTE — Assessment & Plan Note (Signed)
 Previous labs reviewed, her A1c has been elevated in the past. I will check an A1c today. Reminded to avoid refined sugars including sugary drinks/foods and processed meats including bacon, sausages and deli meats.

## 2023-08-21 NOTE — Assessment & Plan Note (Signed)
 Chronic, she will continue with escitalopram, 10mg  1/2 tablet daily.  She will f/u in four to six months for re-evaluation.

## 2023-08-21 NOTE — Patient Instructions (Signed)
 Managing Anxiety This video describes anxiety and what can be done to manage it. To view the content, go to this web address: [email or web address]  This video will expire on: 06/01/2025. If you need access to this video following this date, please reach out to the healthcare provider who assigned it to you. This information is not intended to replace advice given to you by your health care provider. Make sure you discuss any questions you have with your health care provider. Elsevier Patient Education  2024 [employer].

## 2023-08-31 ENCOUNTER — Ambulatory Visit (INDEPENDENT_AMBULATORY_CARE_PROVIDER_SITE_OTHER): Payer: PPO | Admitting: Radiology

## 2023-08-31 ENCOUNTER — Encounter: Payer: Self-pay | Admitting: Radiology

## 2023-08-31 VITALS — BP 116/74 | HR 106 | Ht 64.25 in | Wt 155.6 lb

## 2023-08-31 DIAGNOSIS — Z4689 Encounter for fitting and adjustment of other specified devices: Secondary | ICD-10-CM

## 2023-08-31 DIAGNOSIS — Z01419 Encounter for gynecological examination (general) (routine) without abnormal findings: Secondary | ICD-10-CM

## 2023-08-31 NOTE — Progress Notes (Signed)
 Jessica Crosby Dec 31, 1950 [id number]   History: Postmenopausal 73 y.o. presents for  breast and pelvic and pessary maintenance. Doing well. Takes it out and cleans it atleast weekly. Using vaginal estrogen.   Gynecologic History Postmenopausal Last Pap: 2020. Results were: normal Last mammogram: 4/24. Results were: normal Last colonoscopy: 2023 DEXA:12/24   Obstetric History OB History  Gravida Para Term Preterm AB Living  3 3 3   3   SAB IAB Ectopic Multiple Live Births          # Outcome Date GA Lbr Len/2nd Weight Sex Type Anes PTL Lv  3 Term 62 [redacted]w[redacted]d   F      2 Term 7 [redacted]w[redacted]d   M      1 Term 89 [redacted]w[redacted]d   F           08/17/2023    3:20 PM 10/27/2022   10:29 AM 10/27/2022   10:04 AM  Depression screen PHQ 2/9  Decreased Interest 0 0 0  Down, Depressed, Hopeless 0 0 0  PHQ - 2 Score 0 0 0  Altered sleeping 0 0   Tired, decreased energy 0 0   Change in appetite 0 0   Feeling bad or failure about yourself  0 0   Trouble concentrating 0 0   Moving slowly or fidgety/restless 0 0   Suicidal thoughts 0 0   PHQ-9 Score 0 0   Difficult doing work/chores Not difficult at all Not difficult at all      The following portions of the patient's history were reviewed and updated as appropriate: allergies, current medications, past family history, past medical history, past social history, past surgical history, and problem list.  Review of Systems Pertinent items noted in HPI and remainder of comprehensive ROS otherwise negative.  Past medical history, past surgical history, family history and social history were all reviewed and documented in the EPIC chart.  Exam:  Vitals:   08/31/23 1016  BP: 116/74  Pulse: (!) 106  SpO2: 99%  Weight: 155 lb 9.6 oz (70.6 kg)  Height: 5' 4.25" (1.632 m)   Body mass index is 26.5 kg/m.  General appearance:  Normal Thyroid:  Symmetrical, normal in size, without palpable masses or nodularity. Respiratory  Auscultation:   Clear without wheezing or rhonchi Cardiovascular  Auscultation:  Regular rate, without rubs, murmurs or gallops  Edema/varicosities:  Not grossly evident Abdominal  Soft,nontender, without masses, guarding or rebound.  Liver/spleen:  No organomegaly noted  Hernia:  None appreciated  Skin  Inspection:  Grossly normal Breasts: Examined lying and sitting.   Right: Without masses, retractions, nipple discharge or axillary adenopathy.   Left: Without masses, retractions, nipple discharge or axillary adenopathy. Genitourinary   Inguinal/mons:  Normal without inguinal adenopathy  External genitalia:  Normal appearing vulva with no masses, tenderness, or lesions  BUS/Urethra/Skene's glands:  Normal  Vagina:  Prolapsed vaginal walls normal color and discharge, no lesions. Atrophy: mild. #6ring pessary with support removed without difficulty. Vagina inspected, no erosion, irritation or discharge. Pessary cleaned and put back in place easily. Tolerated well.   Cervix:  Normal appearing without discharge or lesions  Uterus:  Normal in size, shape and contour.  Midline and mobile, nontender  Adnexa/parametria:     Rt: Normal in size, without masses or tenderness.   Lt: Normal in size, without masses or tenderness.  Anus and perineum: Normal    Jessica Crosby, CMA present for exam  Assessment/Plan:   1. Encounter for breast  and pelvic examination (Primary) Follow up 2 years Annual mammograms  2. Pessary maintenance F/u 6 months Continue vaginal estrogen use  Jessica Crosby B WHNP-BC, 10:38 AM 08/31/2023

## 2023-09-12 ENCOUNTER — Other Ambulatory Visit: Payer: Self-pay | Admitting: Internal Medicine

## 2023-09-12 DIAGNOSIS — Z1231 Encounter for screening mammogram for malignant neoplasm of breast: Secondary | ICD-10-CM

## 2023-09-18 ENCOUNTER — Other Ambulatory Visit: Payer: Self-pay | Admitting: Internal Medicine

## 2023-09-28 DIAGNOSIS — H40013 Open angle with borderline findings, low risk, bilateral: Secondary | ICD-10-CM | POA: Diagnosis not present

## 2023-10-04 ENCOUNTER — Other Ambulatory Visit: Payer: Self-pay

## 2023-10-04 DIAGNOSIS — N952 Postmenopausal atrophic vaginitis: Secondary | ICD-10-CM

## 2023-10-04 MED ORDER — ESTRADIOL 0.1 MG/GM VA CREA: TOPICAL_CREAM | VAGINAL | 1 refills | Status: DC

## 2023-10-04 NOTE — Telephone Encounter (Signed)
 [facility] to refill

## 2023-10-14 ENCOUNTER — Ambulatory Visit
Admission: RE | Admit: 2023-10-14 | Discharge: 2023-10-14 | Disposition: A | Discharge status: Home / Self Care | Source: Ambulatory Visit | Attending: Internal Medicine | Admitting: Internal Medicine

## 2023-10-14 DIAGNOSIS — Z1231 Encounter for screening mammogram for malignant neoplasm of breast: Secondary | ICD-10-CM

## 2023-10-17 ENCOUNTER — Telehealth: Payer: Self-pay | Admitting: *Deleted

## 2023-10-17 NOTE — Progress Notes (Signed)
 Complex Care Management Care Guide Note  10/17/2023 Name: Jessica Crosby MRN: [id number] DOB: 1951/05/07  Jessica Crosby is a 73 y.o. year old female who is a primary care patient of Cleave Curling, MD and is actively engaged with the care management team. I reached out to Lettie Ray by phone today to assist with re-scheduling  with the RN Case Manager.  Follow up plan: Unsuccessful telephone outreach attempt made. A HIPAA compliant phone message was left for the patient providing contact information and requesting a return call.  Barnie Bora  [facility]  [facility], [facility] Guide  Direct Dial: [phone]  Fax [phone]

## 2023-10-19 ENCOUNTER — Ambulatory Visit: Payer: PPO

## 2023-10-19 DIAGNOSIS — Z0000 Encounter for general adult medical examination without abnormal findings: Secondary | ICD-10-CM | POA: Diagnosis not present

## 2023-10-19 NOTE — Progress Notes (Cosign Needed Addendum)
 Subjective:   Jessica Crosby is a 73 y.o. who presents for a Medicare Wellness preventive visit.  Visit Complete: Virtual I connected with  Clare Houdeshell Heath on 10/19/23 by a video and audio enabled telemedicine application and verified that I am speaking with the correct person using two identifiers.  Patient Location: Home  Provider Location: Office/Clinic  I discussed the limitations of evaluation and management by telemedicine. The patient expressed understanding and agreed to proceed.  Vital Signs: Because this visit was a virtual/telehealth visit, some criteria may be missing or patient reported. Any vitals not documented were not able to be obtained and vitals that have been documented are patient reported.    Persons Participating in Visit: Patient.  AWV Questionnaire: No: Patient Medicare AWV questionnaire was not completed prior to this visit.  Cardiac Risk Factors include: advanced age (>26men, >31 women);dyslipidemia;hypertension     Objective:    Today's Vitals   There is no height or weight on file to calculate BMI.     10/19/2023    3:44 PM 10/27/2022   10:03 AM 10/14/2021    9:55 AM 10/01/2020   11:01 AM 09/26/2019   11:38 AM 04/17/2019   12:48 PM 03/22/2019   12:13 PM  Advanced Directives  Does Patient Have a Medical Advance Directive? No Yes Yes No No Yes No  Type of Special educational needs teacher of [place];Living will Healthcare Power of [place];Living will   Healthcare Power of Attorney   Does patient want to make changes to medical advance directive?      No - Patient declined   Copy of Healthcare Power of Attorney in Chart?  No - copy requested No - copy requested   No - copy requested   Would patient like information on creating a medical advance directive?    No - Patient declined No - Patient declined      Current Medications (verified) Outpatient Encounter Medications as of 10/19/2023  Medication Sig   amLODipine  (NORVASC ) 5 MG  tablet TAKE 1 TABLET BY MOUTH DAILY AT NIGHT   aspirin 81 MG tablet Take 81 mg by mouth daily.   atorvastatin  (LIPITOR) 20 MG tablet ONE TAB PO MWF   calcium  carbonate (OS-CAL) 600 MG TABS Take 600 mg by mouth 2 (two) times daily with a meal.   Cholecalciferol (VITAMIN D PO) Take 2,000 mg by mouth daily.    escitalopram  (LEXAPRO ) 10 MG tablet Take 1/2 tablet po daily   estradiol  (ESTRACE ) 0.1 MG/GM vaginal cream Apply one gram vaginally twice weekly.   guaiFENesin (MUCINEX) 600 MG 12 hr tablet Take 600 mg by mouth 2 (two) times daily.   losartan -hydrochlorothiazide  (HYZAAR) 100-12.5 MG tablet TAKE 1 TABLET EVERY DAY   Magnesium 250 MG TABS Take by mouth.   Multiple Vitamin (THERA) TABS Take 1 tablet by mouth daily.   Omega-3 Fatty Acids (FISH OIL) 1000 MG CAPS Take 1 capsule by mouth.   No facility-administered encounter medications on file as of 10/19/2023.    Allergies (verified) Ace inhibitors   History: Past Medical History:  Diagnosis Date   Arthritis    GERD (gastroesophageal reflux disease)    Hx of scarlet fever    as achild   Hypertension    Thyroid  disease    hyperparathyroidism   Vitamin D deficiency    Past Surgical History:  Procedure Laterality Date   CHOLECYSTECTOMY  06/21/1997   PARATHYROIDECTOMY Right 06/21/2008   Family History  Problem Relation Age of Onset  Breast cancer Sister 52   Prostate cancer Father 72   Breast cancer Maternal Aunt 81   Prostate cancer Paternal Uncle    Throat cancer Paternal Grandfather        pipe smoker   Breast cancer Sister 90   Lymphoma Sister 61   Leukemia Sister 30       as a result of chemotherapy from her lymphoma   Cancer Sister 69       non hodgins lymphoma/ leukemia   Pancreatic cancer Maternal Aunt 70   Colon cancer Maternal Aunt        diagnosed in her 61s   Thyroid  cancer Maternal Aunt        dx in her 29s   Leukemia Maternal Aunt        diagnosed in her 70s   Breast cancer Cousin        diagnosed in  her 52s   Prostate cancer Paternal Uncle    Social History   Socioeconomic History   Marital status: Married    Spouse name: Not on file   Number of children: Not on file   Years of education: Not on file   Highest education level: Not on file  Occupational History   Not on file  Tobacco Use   Smoking status: Never    Passive exposure: Never   Smokeless tobacco: Never  Vaping Use   Vaping status: Never Used  Substance and Sexual Activity   Alcohol use: No   Drug use: No   Sexual activity: Yes    Partners: Male    Birth control/protection: Post-menopausal    Comment: menarche 73yo, sexual debut 73yo  Other Topics Concern   Not on file  Social History Narrative   Not on file   Social Drivers of Health   Financial Resource Strain: Low Risk  (10/19/2023)   Overall Financial Resource Strain (CARDIA)    Difficulty of Paying Living Expenses: Not hard at all  Food Insecurity: No Food Insecurity (10/19/2023)   Hunger Vital Sign    Worried About Running Out of Food in the Last Year: Never true    Ran Out of Food in the Last Year: Never true  Transportation Needs: No Transportation Needs (10/19/2023)   PRAPARE - Administrator, Civil Service (Medical): No    Lack of Transportation (Non-Medical): No  Physical Activity: Sufficiently Active (10/19/2023)   Exercise Vital Sign    Days of Exercise per Week: 7 days    Minutes of Exercise per Session: 30 min  Stress: No Stress Concern Present (10/19/2023)   [employer] of Occupational Health - Occupational Stress Questionnaire    Feeling of Stress : Not at all  Social Connections: Socially Integrated (10/19/2023)   Social Connection and Isolation Panel [NHANES]    Frequency of Communication with Friends and Family: More than three times a week    Frequency of Social Gatherings with Friends and Family: More than three times a week    Attends Religious Services: More than 4 times per year    Active Member of [employer] or  Organizations: Yes    Attends Engineer, structural: More than 4 times per year    Marital Status: Married    Tobacco Counseling Counseling given: Not Answered    Clinical Intake:  Pre-visit preparation completed: Yes  Pain : No/denies pain     Nutritional Risks: None Diabetes: No  Lab Results  Component Value Date   HGBA1C 5.5 08/17/2023  HGBA1C 5.7 (H) 04/25/2023   HGBA1C 5.3 09/26/2019     How often do you need to have someone help you when you read instructions, pamphlets, or other written materials from your doctor or pharmacy?: 1 - Never  Interpreter Needed?: No  Information entered by :: NAllen LPN   Activities of Daily Living     10/19/2023    3:40 PM 10/27/2022    8:53 AM  In your present state of health, do you have any difficulty performing the following activities:  Hearing? 0 0  Vision? 0 0  Difficulty concentrating or making decisions? 0 0  Walking or climbing stairs? 0 0  Dressing or bathing? 0 0  Doing errands, shopping? 0 0  Preparing Food and eating ? N N  Using the Toilet? N N  In the past six months, have you accidently leaked urine? N N  Do you have problems with loss of bowel control? N N  Managing your Medications? N N  Managing your Finances? N N  Housekeeping or managing your Housekeeping? N N    Patient Care Team: Cleave Curling, MD as PCP - General (Internal Medicine) Nathen Balder Skeeter Dukes, RN as [facility] Care Management Little, Skeeter Dukes, RN [place], Patricia Boon [facility])  Indicate any recent Medical Services you may have received from other than [facility] providers in the past year (date may be approximate).     Assessment:   This is a routine wellness examination for Indira.  Hearing/Vision screen Hearing Screening - Comments:: Denies hearing issues Vision Screening - Comments:: Regular eye exams, Dr. Alto Atta   Goals Addressed             This Visit's Progress    Patient Stated       10/19/2023, wants to cut back on sugar        Depression Screen     10/19/2023    3:46 PM 08/17/2023    3:20 PM 10/27/2022   10:29 AM 10/27/2022   10:04 AM 12/24/2021   10:23 AM 10/14/2021    9:56 AM 04/09/2021    8:37 AM  PHQ 2/9 Scores  PHQ - 2 Score 0 0 0 0 0 0 0  PHQ- 9 Score 0 0 0  0  0    Fall Risk     10/19/2023    3:45 PM 08/17/2023    3:15 PM 10/27/2022    8:53 AM 10/14/2021    9:56 AM 10/01/2020   11:01 AM  Fall Risk   Falls in the past year? 0 0 0 0 0  Number falls in past yr: 0 0 0 0   Injury with Fall? 0 0 0 0   Risk for fall due to : Medication side effect No Fall Risks Medication side effect Medication side effect Medication side effect  Follow up Falls prevention discussed;Falls evaluation completed Falls evaluation completed Falls prevention discussed;Education provided;Falls evaluation completed Falls evaluation completed;Education provided;Falls prevention discussed Falls evaluation completed;Education provided;Falls prevention discussed    MEDICARE RISK AT HOME:  Medicare Risk at Home Any stairs in or around the home?: Yes If so, are there any without handrails?: No Home free of loose throw rugs in walkways, pet beds, electrical cords, etc?: Yes Adequate lighting in your home to reduce risk of falls?: Yes Life alert?: No Use of a cane, walker or w/c?: No Grab bars in the bathroom?: Yes Shower chair or bench in shower?: Yes Elevated toilet seat or a handicapped toilet?: Yes  TIMED UP AND  GO:  Was the test performed?  No  Cognitive Function: 6CIT completed        10/19/2023    3:47 PM 10/27/2022   10:05 AM 10/14/2021    9:57 AM 10/01/2020   11:03 AM 09/26/2019   11:40 AM  6CIT Screen  What Year? 0 points 0 points 0 points 0 points 0 points  What month? 0 points 0 points 0 points 0 points 0 points  What time? 0 points 0 points 0 points 0 points 0 points  Count back from 20 0 points 0 points 0 points 0 points 0 points  Months in reverse 0 points 0 points 0 points 0 points 0 points  Repeat phrase 0  points 0 points 2 points 2 points 0 points  Total Score 0 points 0 points 2 points 2 points 0 points    Immunizations Immunization History  Administered Date(s) Administered   DT (Pediatric) 02/12/2003   Fluad Quad(high Dose 65+) 04/08/2020, 04/09/2021   Influenza, High Dose Seasonal PF 03/13/2018, 03/01/2019, 03/31/2022, 04/18/2023   Influenza, Seasonal, Injecte, Preservative Fre 04/29/2001, 05/11/2005, 03/30/2006, 05/12/2007, 04/01/2015   Influenza-Unspecified 04/29/2001, 05/11/2005, 03/30/2006, 05/12/2007, 04/19/2014, 04/01/2015, 03/26/2017   PFIZER(Purple Top)SARS-COV-2 Vaccination 07/13/2019, 08/03/2019   Pneumococcal Conjugate-13 05/26/2016   Pneumococcal Polysaccharide-23 03/28/2019   Tdap 03/04/2015   Zoster Recombinant(Shingrix ) 10/14/2021, 12/24/2021   Zoster, Live 06/22/2010    Screening Tests Health Maintenance  Topic Date Due   COVID-19 Vaccine (3 - 2024-25 season) 11/04/2023 (Originally 02/20/2023)   INFLUENZA VACCINE  01/20/2024   Medicare Annual Wellness (AWV)  10/18/2024   DTaP/Tdap/Td (3 - Td or Tdap) 03/03/2025   MAMMOGRAM  10/13/2025   Colonoscopy  02/04/2032   Pneumonia Vaccine 11+ Years old  Completed   DEXA SCAN  Completed   Hepatitis C Screening  Completed   Zoster Vaccines- Shingrix   Completed   HPV VACCINES  Aged Out   Meningococcal B Vaccine  Aged Out    Health Maintenance  There are no preventive care reminders to display for this patient.  Health Maintenance Items Addressed: Declines covid vaccine  Additional Screening:  Vision Screening: Recommended annual ophthalmology exams for early detection of glaucoma and other disorders of the eye.  Dental Screening: Recommended annual dental exams for proper oral hygiene  Community Resource Referral / Chronic Care Management: CRR required this visit?  No   CCM required this visit?  No     Plan:     I have personally reviewed and noted the following in the patient's chart:   Medical and  social history Use of alcohol, tobacco or illicit drugs  Current medications and supplements including opioid prescriptions. Patient is not currently taking opioid prescriptions. Functional ability and status Nutritional status Physical activity Advanced directives List of other physicians Hospitalizations, surgeries, and ER visits in previous 12 months Vitals Screenings to include cognitive, depression, and falls Referrals and appointments  In addition, I have reviewed and discussed with patient certain preventive protocols, quality metrics, and best practice recommendations. A written personalized care plan for preventive services as well as general preventive health recommendations were provided to patient.     Areatha Beecham, LPN   1/61/0960   After Visit Summary: (MyChart) Due to this being a telephonic visit, the after visit summary with patients personalized plan was offered to patient via MyChart   Notes: Nothing significant to report at this time.

## 2023-10-19 NOTE — Patient Instructions (Signed)
 Jessica Crosby , Thank you for taking time to come for your Medicare Wellness Visit. I appreciate your ongoing commitment to your health goals. Please review the following plan we discussed and let me know if I can assist you in the future.   Referrals/Orders/Follow-Ups/Clinician Recommendations: none  This is a list of the screening recommended for you and due dates:  Health Maintenance  Topic Date Due   COVID-19 Vaccine (3 - 2024-25 season) 11/04/2023*   Flu Shot  01/20/2024   Medicare Annual Wellness Visit  10/18/2024   DTaP/Tdap/Td vaccine (3 - Td or Tdap) 03/03/2025   Mammogram  10/13/2025   Colon Cancer Screening  02/04/2032   Pneumonia Vaccine  Completed   DEXA scan (bone density measurement)  Completed   Hepatitis C Screening  Completed   Zoster (Shingles) Vaccine  Completed   HPV Vaccine  Aged Out   Meningitis B Vaccine  Aged Out  *Topic was postponed. The date shown is not the original due date.    Advanced directives: (ACP Link)Information on Advanced Care Planning can be found at [place]  Secretary of [facility] Advance Health Care Directives Advance Health Care Directives. [email or web address]   Next Medicare Annual Wellness Visit scheduled for next year: No, office will schedule  insert Preventive Care attachment Insert FALL PREVENTION attachment if needed

## 2023-10-24 ENCOUNTER — Encounter: Payer: Self-pay | Admitting: Internal Medicine

## 2023-10-24 ENCOUNTER — Ambulatory Visit (INDEPENDENT_AMBULATORY_CARE_PROVIDER_SITE_OTHER): Payer: Self-pay | Admitting: Internal Medicine

## 2023-10-24 VITALS — BP 130/70 | HR 89 | Temp 98.1°F | Ht 64.0 in | Wt 156.0 lb

## 2023-10-24 DIAGNOSIS — I10 Essential (primary) hypertension: Secondary | ICD-10-CM | POA: Diagnosis not present

## 2023-10-24 DIAGNOSIS — N952 Postmenopausal atrophic vaginitis: Secondary | ICD-10-CM | POA: Diagnosis not present

## 2023-10-24 DIAGNOSIS — Z8249 Family history of ischemic heart disease and other diseases of the circulatory system: Secondary | ICD-10-CM | POA: Diagnosis not present

## 2023-10-24 DIAGNOSIS — E7800 Pure hypercholesterolemia, unspecified: Secondary | ICD-10-CM | POA: Diagnosis not present

## 2023-10-24 MED ORDER — LOSARTAN POTASSIUM-HCTZ 100-12.5 MG PO TABS: 1.0000 | ORAL_TABLET | Freq: Every day | ORAL | 2 refills | Status: AC

## 2023-10-24 MED ORDER — ATORVASTATIN CALCIUM 20 MG PO TABS: ORAL_TABLET | ORAL | 2 refills | Status: DC

## 2023-10-24 MED ORDER — ESCITALOPRAM OXALATE 10 MG PO TABS: ORAL_TABLET | ORAL | 1 refills | Status: DC

## 2023-10-24 MED ORDER — AMLODIPINE BESYLATE 5 MG PO TABS: ORAL_TABLET | ORAL | 1 refills | Status: DC

## 2023-10-24 NOTE — Patient Instructions (Signed)
 Hypertension, Adult Hypertension is another name for high blood pressure. High blood pressure forces your heart to work harder to pump blood. This can cause problems over time. There are two numbers in a blood pressure reading. There is a top number (systolic) over a bottom number (diastolic). It is best to have a blood pressure that is below 120/80. What are the causes? The cause of this condition is not known. Some other conditions can lead to high blood pressure. What increases the risk? Some lifestyle factors can make you more likely to develop high blood pressure: Smoking. Not getting enough exercise or physical activity. Being overweight. Having too much fat, sugar, calories, or salt (sodium) in your diet. Drinking too much alcohol. Other risk factors include: Having any of these conditions: Heart disease. Diabetes. High cholesterol. Kidney disease. Obstructive sleep apnea. Having a family history of high blood pressure and high cholesterol. Age. The risk increases with age. Stress. What are the signs or symptoms? High blood pressure may not cause symptoms. Very high blood pressure (hypertensive crisis) may cause: Headache. Fast or uneven heartbeats (palpitations). Shortness of breath. Nosebleed. Vomiting or feeling like you may vomit (nauseous). Changes in how you see. Very bad chest pain. Feeling dizzy. Seizures. How is this treated? This condition is treated by making healthy lifestyle changes, such as: Eating healthy foods. Exercising more. Drinking less alcohol. Your doctor may prescribe medicine if lifestyle changes do not help enough and if: Your top number is above 130. Your bottom number is above 80. Your personal target blood pressure may vary. Follow these instructions at home: Eating and drinking  If told, follow the DASH eating plan. To follow this plan: Fill one half of your plate at each meal with fruits and vegetables. Fill one fourth of your plate  at each meal with whole grains. Whole grains include whole-wheat pasta, brown rice, and whole-grain bread. Eat or drink low-fat dairy products, such as skim milk or low-fat yogurt. Fill one fourth of your plate at each meal with low-fat (lean) proteins. Low-fat proteins include fish, chicken without skin, eggs, beans, and tofu. Avoid fatty meat, cured and processed meat, or chicken with skin. Avoid pre-made or processed food. Limit the amount of salt in your diet to less than 1,500 mg each day. Do not drink alcohol if: Your doctor tells you not to drink. You are pregnant, may be pregnant, or are planning to become pregnant. If you drink alcohol: Limit how much you have to: 0-1 drink a day for women. 0-2 drinks a day for men. Know how much alcohol is in your drink. In the [place] one drink equals one 12 oz bottle of beer (355 mL), one 5 oz glass of wine (148 mL), or one 1 oz glass of hard liquor (44 mL). Lifestyle  Work with your doctor to stay at a healthy weight or to lose weight. Ask your doctor what the best weight is for you. Get at least 30 minutes of exercise that causes your heart to beat faster (aerobic exercise) most days of the week. This may include walking, swimming, or biking. Get at least 30 minutes of exercise that strengthens your muscles (resistance exercise) at least 3 days a week. This may include lifting weights or doing Pilates. Do not smoke or use any products that contain nicotine or tobacco. If you need help quitting, ask your doctor. Check your blood pressure at home as told by your doctor. Keep all follow-up visits. Medicines Take over-the-counter and prescription medicines  only as told by your doctor. Follow directions carefully. Do not skip doses of blood pressure medicine. The medicine does not work as well if you skip doses. Skipping doses also puts you at risk for problems. Ask your doctor about side effects or reactions to medicines that you should watch  for. Contact a doctor if: You think you are having a reaction to the medicine you are taking. You have headaches that keep coming back. You feel dizzy. You have swelling in your ankles. You have trouble with your vision. Get help right away if: You get a very bad headache. You start to feel mixed up (confused). You feel weak or numb. You feel faint. You have very bad pain in your: Chest. Belly (abdomen). You vomit more than once. You have trouble breathing. These symptoms may be an emergency. Get help right away. Call 911. Do not wait to see if the symptoms will go away. Do not drive yourself to the hospital. Summary Hypertension is another name for high blood pressure. High blood pressure forces your heart to work harder to pump blood. For most people, a normal blood pressure is less than 120/80. Making healthy choices can help lower blood pressure. If your blood pressure does not get lower with healthy choices, you may need to take medicine. This information is not intended to replace advice given to you by your health care provider. Make sure you discuss any questions you have with your health care provider. Document Revised: 03/26/2021 Document Reviewed: 03/26/2021 Elsevier Patient Education  2024 [employer].

## 2023-10-24 NOTE — Progress Notes (Signed)
 I,Victoria T Basil Lim, CMA,acting as a Neurosurgeon for Smiley Dung, MD.,have documented all relevant documentation on the behalf of Smiley Dung, MD,as directed by  Smiley Dung, MD while in the presence of Smiley Dung, MD.  Subjective:  Patient ID: Jessica Crosby , female    DOB: 1951/06/21 , 73 y.o.   MRN: [id number]  Chief Complaint  Patient presents with   Hypertension    Patient presents today for bp & cholesterol follow up. She reports compliance with medications. Denies headache, chest pain & sob.    Hyperlipidemia    HPI Discussed the use of AI scribe software for clinical note transcription with the patient, who gave verbal consent to proceed.  History of Present Illness Jessica Crosby "Jessica Crosby" is a 73 year old female with hypertension and hyperlipidemia who presents for a blood pressure and cholesterol check.  Her blood pressure readings at home have been in the 120s over 60s to 70s, which she describes as 'beautiful'. She feels good and is not experiencing significant symptoms related to her blood pressure. Her current medications for hypertension include losartan  100 mg and hydrochlorothiazide  12.5 mg daily, and amlodipine  5 mg daily.  She takes atorvastatin  20 mg three times a week (Monday, Wednesday, Friday) for cholesterol management. She has a family history of heart disease, including her grandmother who had a stroke at 20 and died at 70, and both grandmothers who had heart attacks. She has undergone a calcium  score test which was zero.  She is trying to maintain an active lifestyle, although she has been less active in April due to being busy. She enjoys walking and plans to resume yoga classes. She mentions a challenge with reducing sugar intake, as she loves sweets.  She uses Estrace  vaginal cream twice a week and has a history of using a pessary.    Hypertension This is a chronic problem. The current episode started more than 1 year ago. The  problem has been gradually improving since onset. The problem is controlled. Pertinent negatives include no blurred vision, chest pain, palpitations or shortness of breath. Risk factors for coronary artery disease include dyslipidemia and post-menopausal state. Past treatments include angiotensin blockers and diuretics. Compliance problems include exercise.   Hyperlipidemia Pertinent negatives include no chest pain or shortness of breath. The current treatment provides moderate improvement of lipids. There are no compliance problems.  Risk factors for coronary artery disease include dyslipidemia and post-menopausal.     Past Medical History:  Diagnosis Date   Arthritis    GERD (gastroesophageal reflux disease)    Hx of scarlet fever    as achild   Hypertension    Thyroid  disease    hyperparathyroidism   Vitamin D deficiency      Family History  Problem Relation Age of Onset   Breast cancer Sister 64   Prostate cancer Father 31   Breast cancer Maternal Aunt 81   Prostate cancer Paternal Uncle    Throat cancer Paternal Grandfather        pipe smoker   Breast cancer Sister 47   Lymphoma Sister 67   Leukemia Sister 26       as a result of chemotherapy from her lymphoma   Cancer Sister 24       non hodgins lymphoma/ leukemia   Pancreatic cancer Maternal Aunt 78   Colon cancer Maternal Aunt        diagnosed in her 72s   Thyroid  cancer Maternal Aunt  dx in her 35s   Leukemia Maternal Aunt        diagnosed in her 57s   Breast cancer Cousin        diagnosed in her 74s   Prostate cancer Paternal Uncle      Current Outpatient Medications:    aspirin 81 MG tablet, Take 81 mg by mouth daily., Disp: , Rfl:    calcium  carbonate (OS-CAL) 600 MG TABS, Take 600 mg by mouth 2 (two) times daily with a meal., Disp: , Rfl:    Cholecalciferol (VITAMIN D PO), Take 2,000 mg by mouth daily. , Disp: , Rfl:    estradiol  (ESTRACE ) 0.1 MG/GM vaginal cream, Apply one gram vaginally twice  weekly., Disp: 42.5 g, Rfl: 1   guaiFENesin (MUCINEX) 600 MG 12 hr tablet, Take 600 mg by mouth 2 (two) times daily., Disp: , Rfl:    Magnesium 250 MG TABS, Take by mouth., Disp: , Rfl:    Multiple Vitamin (THERA) TABS, Take 1 tablet by mouth daily., Disp: , Rfl:    Omega-3 Fatty Acids (FISH OIL) 1000 MG CAPS, Take 1 capsule by mouth., Disp: , Rfl:    amLODipine  (NORVASC ) 5 MG tablet, TAKE 1 TABLET BY MOUTH DAILY AT NIGHT, Disp: 90 tablet, Rfl: 1   atorvastatin  (LIPITOR) 20 MG tablet, ONE TAB PO MWF, Disp: 45 tablet, Rfl: 2   escitalopram  (LEXAPRO ) 10 MG tablet, Take 1/2 tablet po daily, Disp: 45 tablet, Rfl: 1   losartan -hydrochlorothiazide  (HYZAAR) 100-12.5 MG tablet, Take 1 tablet by mouth daily., Disp: 90 tablet, Rfl: 2   Allergies  Allergen Reactions   Ace Inhibitors Cough     Review of Systems  Constitutional: Negative.   Eyes:  Negative for blurred vision.  Respiratory: Negative.  Negative for shortness of breath.   Cardiovascular: Negative.  Negative for chest pain and palpitations.  Neurological: Negative.   Psychiatric/Behavioral: Negative.       Today's Vitals   10/24/23 0933  BP: 130/70  Pulse: 89  Temp: 98.1 F (36.7 C)  SpO2: 98%  Weight: 156 lb (70.8 kg)  Height: 5\' 4"  (1.626 m)   Body mass index is 26.78 kg/m.  Wt Readings from Last 3 Encounters:  10/24/23 156 lb (70.8 kg)  08/31/23 155 lb 9.6 oz (70.6 kg)  08/17/23 159 lb (72.1 kg)     Objective:  Physical Exam Vitals and nursing note reviewed.  Constitutional:      Appearance: Normal appearance.  HENT:     Head: Normocephalic and atraumatic.  Eyes:     Extraocular Movements: Extraocular movements intact.  Cardiovascular:     Rate and Rhythm: Normal rate and regular rhythm.     Heart sounds: Normal heart sounds.  Pulmonary:     Effort: Pulmonary effort is normal.     Breath sounds: Normal breath sounds.  Musculoskeletal:     Cervical back: Normal range of motion.  Skin:    General: Skin is  warm.  Neurological:     General: No focal deficit present.     Mental Status: She is alert.  Psychiatric:        Mood and Affect: Mood normal.        Behavior: Behavior normal.      Assessment And Plan:  Essential hypertension Assessment & Plan: Chronic, fair control.  Goal BP<130/80.  She will continue with losartan /hct 100/12.5mg  and amlodipine  5mg  daily. She will f/u in six months for a full physical exam.  She is reminded to follow  low sodium diet.   Orders: -     BMP8+eGFR -     amLODIPine  Besylate; TAKE 1 TABLET BY MOUTH DAILY AT NIGHT  Dispense: 90 tablet; Refill: 1  Pure hypercholesterolemia Assessment & Plan: Chronic, she is currently taking atorvastatin  20mg  MWF.  Lipoprotein A test planned to assess genetic predisposition to heart disease. - Order Lipoprotein A test.  Orders: -     Lipid panel -     Lipoprotein A (LPA) -     Atorvastatin  Calcium ; ONE TAB PO MWF  Dispense: 45 tablet; Refill: 2  Atrophic vaginitis Assessment & Plan: Uses Estrace  vaginal cream twice a weekly per GYN.    Family history of heart disease -     Lipoprotein A (LPA)  Other orders -     Escitalopram  Oxalate; Take 1/2 tablet po daily  Dispense: 45 tablet; Refill: 1 -     Losartan  Potassium-HCTZ; Take 1 tablet by mouth daily.  Dispense: 90 tablet; Refill: 2    Return if symptoms worsen or fail to improve.  Patient was given opportunity to ask questions. Patient verbalized understanding of the plan and was able to repeat key elements of the plan. All questions were answered to their satisfaction.    I, Smiley Dung, MD, have reviewed all documentation for this visit. The documentation on 10/24/23 for the exam, diagnosis, procedures, and orders are all accurate and complete.   IF YOU HAVE BEEN REFERRED TO A SPECIALIST, IT MAY TAKE 1-2 WEEKS TO SCHEDULE/PROCESS THE REFERRAL. IF YOU HAVE NOT HEARD FROM US /SPECIALIST IN TWO WEEKS, PLEASE GIVE US  A CALL AT [phone].   THE  PATIENT IS ENCOURAGED TO PRACTICE SOCIAL DISTANCING DUE TO THE COVID-19 PANDEMIC.

## 2023-10-24 NOTE — Assessment & Plan Note (Addendum)
 Uses Estrace  vaginal cream twice a weekly per GYN.

## 2023-10-24 NOTE — Assessment & Plan Note (Signed)
 Chronic, fair control.  Goal BP<130/80.  She will continue with losartan /hct 100/12.5mg  and amlodipine  5mg  daily. She will f/u in six months for a full physical exam.  She is reminded to follow low sodium diet.

## 2023-10-24 NOTE — Assessment & Plan Note (Signed)
 Chronic, she is currently taking atorvastatin  20mg  MWF.  Lipoprotein A test planned to assess genetic predisposition to heart disease. - Order Lipoprotein A test.

## 2023-10-25 LAB — BMP8+EGFR
BUN/Creatinine Ratio: 13 (ref 12–28)
BUN: 12 mg/dL (ref 8–27)
CO2: 24 mmol/L (ref 20–29)
Calcium: 9.7 mg/dL (ref 8.7–10.3)
Chloride: 100 mmol/L (ref 96–106)
Creatinine, Ser: 0.94 mg/dL (ref 0.57–1.00)
Glucose: 86 mg/dL (ref 70–99)
Potassium: 4.4 mmol/L (ref 3.5–5.2)
Sodium: 139 mmol/L (ref 134–144)
eGFR: 64 mL/min/{1.73_m2} (ref >59)

## 2023-10-25 LAB — LIPID PANEL
Chol/HDL Ratio: 2 ratio (ref 0.0–4.4)
Cholesterol, Total: 155 mg/dL (ref 100–199)
HDL: 77 mg/dL (ref >39)
LDL Chol Calc (NIH): 61 mg/dL (ref 0–99)
Triglycerides: 96 mg/dL (ref 0–149)
VLDL Cholesterol Cal: 17 mg/dL (ref 5–40)

## 2023-10-25 LAB — LIPOPROTEIN A (LPA): Lipoprotein (a): 38.1 nmol/L (ref <75.0)

## 2023-10-28 ENCOUNTER — Encounter: Payer: Self-pay | Admitting: Internal Medicine

## 2023-10-28 NOTE — Progress Notes (Signed)
 Complex Care Management Care Guide Note  10/28/2023 Name: Jessica Crosby MRN: [id number] DOB: [birth year]  Jessica Crosby is a 74 y.o. year old female who is a primary care patient of Cleave Curling, MD and is actively engaged with the care management team. I reached out to Jessica Crosby by phone today to assist with re-scheduling  with the RN Case Manager.  Follow up plan: Telephone appointment with complex care management team member scheduled for:  5/21  Jessica Crosby  [facility]  [facility], [facility] Guide  Direct Dial: [phone]  Fax [phone]

## 2023-11-01 DIAGNOSIS — M67911 Unspecified disorder of synovium and tendon, right shoulder: Secondary | ICD-10-CM | POA: Diagnosis not present

## 2023-11-09 ENCOUNTER — Telehealth: Payer: Self-pay

## 2023-11-25 ENCOUNTER — Encounter (HOSPITAL_COMMUNITY): Payer: Self-pay | Admitting: Occupational Therapy

## 2023-11-25 ENCOUNTER — Ambulatory Visit (HOSPITAL_COMMUNITY): Attending: Orthopedic Surgery | Admitting: Occupational Therapy

## 2023-11-25 ENCOUNTER — Other Ambulatory Visit: Payer: Self-pay

## 2023-11-25 DIAGNOSIS — R29898 Other symptoms and signs involving the musculoskeletal system: Secondary | ICD-10-CM | POA: Insufficient documentation

## 2023-11-25 DIAGNOSIS — M25511 Pain in right shoulder: Secondary | ICD-10-CM | POA: Insufficient documentation

## 2023-11-25 NOTE — Patient Instructions (Signed)

## 2023-11-25 NOTE — Therapy (Signed)
 OUTPATIENT OCCUPATIONAL THERAPY ORTHO EVALUATION  Patient Name: Jessica Crosby MRN: [id number] DOB:06/27/1950, 73 y.o., female Today's Date: 11/25/2023   END OF SESSION:  OT End of Session - 11/25/23 1002     Visit Number 1    Number of Visits 8    Date for OT Re-Evaluation 12/25/23    Authorization Type [employer], $15 copay    Authorization Time Period no auth required    Progress Note Due on Visit 10    OT Start Time 0930    OT Stop Time 1000    OT Time Calculation (min) 30 min    Activity Tolerance Patient tolerated treatment well    Behavior During Therapy WFL for tasks assessed/performed             Past Medical History:  Diagnosis Date   Arthritis    GERD (gastroesophageal reflux disease)    Hx of scarlet fever    as achild   Hypertension    Thyroid  disease    hyperparathyroidism   Vitamin D deficiency    Past Surgical History:  Procedure Laterality Date   CHOLECYSTECTOMY  06/21/1997   PARATHYROIDECTOMY Right 06/21/2008   Patient Active Problem List   Diagnosis Date Noted   Atrophic vaginitis 10/24/2023   Elevated liver function tests 08/21/2023   Other abnormal glucose 08/21/2023   Overweight with body mass index (BMI) of 27 to 27.9 in adult 07/11/2023   Encounter for general adult medical examination w/o abnormal findings 04/25/2023   Pure hypercholesterolemia 04/25/2023   Colon cancer screening 10/06/2022   Obesity 10/06/2022   Second degree hemorrhoids 10/06/2022   Night sweats 04/13/2020   Primary insomnia 04/13/2020   Situational anxiety 04/13/2020   Vitamin D deficiency disease 04/13/2020   Allergic rhinitis due to other allergen 06/10/2017   Depression 06/10/2017   Pericarditis 06/10/2017   Scarlet fever 06/10/2017   Single skin nodule 06/25/2014   Multiple thyroid  nodules 01/08/2014   Essential hypertension 12/25/2013   H/O hyperparathyroidism 12/25/2013   Abdominal pain, chronic, epigastric 05/09/2012   Hypercalcemia  12/30/2008   Family history of malignant neoplasm of breast 03/30/2006   Fibrocystic breast disease 03/30/2006   Enthesopathy of hip region 08/30/2005   Esophageal reflux 12/27/2003   Mixed hyperlipidemia 02/12/2003   Osteopenia 01/26/2000    PCP: Dr. Cleave Curling  REFERRING PROVIDER: Dr. Neil Balls  ONSET DATE: ~ 2 month ago  REFERRING DIAG: right shoulder pain  THERAPY DIAG:  Acute pain of right shoulder  Other symptoms and signs involving the musculoskeletal system  Rationale for Evaluation and Treatment: Rehabilitation  SUBJECTIVE:   SUBJECTIVE STATEMENT: S: I walk every night and it hurts.  Pt accompanied by: self  PERTINENT HISTORY: Pt is a 73 y/o female presenting with right shoulder pain from unknown origin. Pt with PMH of right RC repair in 2019.   PRECAUTIONS: None  WEIGHT BEARING RESTRICTIONS: No  PAIN:  Are you having pain? Yes: NPRS scale: 4/10 Pain location: right shoulder down to bicep Pain description: aching, occasional sharp pains Aggravating factors: reaching overhead Relieving factors: rest  FALLS: Has patient fallen in last 6 months? No  PLOF: Independent  PATIENT GOALS: To have less pain and be able to move the RUE  NEXT MD VISIT: 12/15/23  OBJECTIVE:   HAND DOMINANCE: Right  ADLs: Overall ADLs: Pt reports difficulty with reaching overhead and out to the side, UB dressing, sleeping, lifting items. Pt with difficulty with cleaning and cooking at times.   FUNCTIONAL  OUTCOME MEASURES: Upper Extremity Functional Scale (UEFS): 39/80=49%  UPPER EXTREMITY ROM:       Assessed in sitting, er/IR adducted  Active ROM Right eval  Shoulder flexion 122  Shoulder abduction 128  Shoulder internal rotation 90  Shoulder external rotation 46  (Blank rows = not tested)    Assessed in supine, er/IR adducted  Active ROM Right eval  Shoulder flexion 168  Shoulder abduction 175  Shoulder internal rotation 90  Shoulder external rotation 60   (Blank rows = not tested)  UPPER EXTREMITY MMT:     Assessed in sitting, er/IR adducted  MMT Right eval  Shoulder flexion 4/5  Shoulder abduction 4-/5  Shoulder internal rotation 5/5  Shoulder external rotation 4/5  (Blank rows = not tested)   SENSATION: WFL  EDEMA: None  COGNITION: Overall cognitive status: Within functional limits for tasks assessed  OBSERVATIONS: mod fascial restrictions in left upper arm, trapezius, and scapular regions   TODAY'S TREATMENT:                                                                                                                              DATE:  Eval:  -AA/ROM: sitting-protraction, flexion, er, abduction, horizontal abduction, 10 reps    PATIENT EDUCATION: Education details: AA/ROM Person educated: Patient Education method: Programmer, multimedia, Facilities manager, and Handouts Education comprehension: verbalized understanding and returned demonstration  HOME EXERCISE PROGRAM: Eval: AA/ROM  GOALS: Goals reviewed with patient? Yes   SHORT TERM GOALS: Target date: 12/25/23  Pt will be provided with and educated on HEP to improve mobility in RUE required for use during ADL completion.   Goal status: INITIAL  2.  Pt will increase RUE A/ROM by 20+ degrees to improve ability to use RUE when reaching overhead or behind back during dressing and bathing tasks  Goal status: INITIAL  3.  Pt will increase RUE strength to 4+/5 or greater to improve ability to lift or carry items during meal preparation/housework/yardwork tasks.   Goal status: INITIAL  4.  Pt will decrease pain in RUE to 3/10 or less to improve ability to sleep for 2+ consecutive hours without waking due to pain.   Goal status: INITIAL  5. Pt will decrease RUE fascial restrictions to min amounts or less to improve mobility required for functional reaching tasks.   Goal status: INITIAL    ASSESSMENT:  CLINICAL IMPRESSION: Patient is a 73 y.o. female who was seen  today for occupational therapy evaluation for right shoulder pain. Pt presents with increased pain and fascial restrictions, decreased ROM, strength, and functional use of the RUE. Pt with great joint mobility, intermittent clicking noted. Pt with catching pain near end ROM, improved with AA/ROM tasks.    PERFORMANCE DEFICITS: in functional skills including in functional skills including ADLs, IADLs, coordination, tone, ROM, strength, pain, fascial restrictions, muscle spasms, and UE functional use  IMPAIRMENTS: are limiting patient from ADLs, IADLs, rest and sleep, and leisure.   COMORBIDITIES:  has no other co-morbidities that affects occupational performance. Patient will benefit from skilled OT to address above impairments and improve overall function.  MODIFICATION OR ASSISTANCE TO COMPLETE EVALUATION: No modification of tasks or assist necessary to complete an evaluation.  OT OCCUPATIONAL PROFILE AND HISTORY: Problem focused assessment: Including review of records relating to presenting problem.  CLINICAL DECISION MAKING: LOW - limited treatment options, no task modification necessary  REHAB POTENTIAL: Good  EVALUATION COMPLEXITY: Low      PLAN:  OT FREQUENCY: 2x/week  OT DURATION: 4 weeks  PLANNED INTERVENTIONS: 97168 OT Re-evaluation, 97535 self care/ADL training, 19147 therapeutic exercise, 97530 therapeutic activity, 97112 neuromuscular re-education, 97140 manual therapy, 97014 electrical stimulation unattended, patient/family education, and DME and/or AE instructions  RECOMMENDED OTHER SERVICES: None at this time  CONSULTED AND AGREED WITH PLAN OF CARE: Patient  PLAN FOR NEXT SESSION: Follow up on HEP, manual techniques prn, passive stretching, AA/ROM, A/ROM, shoulder and scapular strengthening and stability work   [employer], OTR/L  [phone] 11/25/2023, 10:03 AM

## 2023-11-28 ENCOUNTER — Other Ambulatory Visit: Payer: Self-pay | Admitting: Internal Medicine

## 2023-12-07 ENCOUNTER — Telehealth

## 2023-12-08 ENCOUNTER — Encounter (HOSPITAL_COMMUNITY): Payer: Self-pay | Admitting: Occupational Therapy

## 2023-12-08 ENCOUNTER — Other Ambulatory Visit: Payer: Self-pay

## 2023-12-08 ENCOUNTER — Ambulatory Visit (HOSPITAL_COMMUNITY): Admitting: Occupational Therapy

## 2023-12-08 DIAGNOSIS — M25511 Pain in right shoulder: Secondary | ICD-10-CM

## 2023-12-08 DIAGNOSIS — R29898 Other symptoms and signs involving the musculoskeletal system: Secondary | ICD-10-CM

## 2023-12-08 NOTE — Patient Instructions (Signed)
 Repeat all exercises 10-15 times, 1-2 times per day.  1) Shoulder Protraction    Begin with elbows by your side, slowly "punch" straight out in front of you.      2) Shoulder Flexion Standing:         Begin with arms at your side with thumbs pointed up, slowly raise both arms up and forward towards overhead.               3) Horizontal abduction/adduction  Standing:           Begin with arms straight out in front of you, bring out to the side in at "T" shape. Keep arms straight entire time.                 4) Internal & External Rotation  Standing:     Stand with elbows at the side and elbows bent 90 degrees. Move your forearms away from your body, then bring back inward toward the body.     5) Shoulder Abduction  Standing:       Lying on your back begin with your arms flat on the table next to your side. Slowly move your arms out to the side so that they go overhead, in a jumping jack or snow angel movement.

## 2023-12-08 NOTE — Patient Outreach (Signed)
 Complex Care Management   Visit Note  12/08/2023  Name:  Jessica Crosby MRN: [id number] DOB: May 13, 1951  Situation: Referral received for Complex Care Management related to Essential Hypertension, OT for s/p right shoulder rotator cuff repair. I obtained verbal consent from Patient.  Visit completed with patient  on the phone.  Background:   Past Medical History:  Diagnosis Date   Arthritis    GERD (gastroesophageal reflux disease)    Hx of scarlet fever    as achild   Hypertension    Thyroid  disease    hyperparathyroidism   Vitamin D deficiency     Assessment: Patient Reported Symptoms:  Cognitive Cognitive Status: Alert and oriented to person, place, and time Cognitive/Intellectual Conditions Management [RPT]: None reported or documented in medical history or problem list   Health Maintenance Behaviors: Annual physical exam, Exercise, Healthy diet, Spiritual practice(s), Sleep adequate, Immunizations, Social activities Healing Pattern: Average Health Facilitated by: Healthy diet, Rest  Neurological Neurological Review of Symptoms: No symptoms reported    HEENT HEENT Symptoms Reported: No symptoms reported      Cardiovascular Cardiovascular Symptoms Reported: No symptoms reported Does patient have uncontrolled Hypertension?: No Cardiovascular Conditions: Hypertension Cardiovascular Management Strategies: Routine screening, Medication therapy, Medical device, Diet modification, Exercise Weight: 151 lb (68.5 kg) (patient reported) Cardiovascular Self-Management Outcome: 4 (good)  Respiratory Respiratory Symptoms Reported: No symptoms reported Respiratory Conditions: Seasonal allergies Respiratory Self-Management Outcome: 4 (good)  Endocrine Patient reports the following symptoms related to hypoglycemia or hyperglycemia : No symptoms reported Endocrine Conditions: Other Other Endocrine Conditions: osteoporosis Endocrine Management Strategies: Medication therapy,  Routine screening, Exercise Endocrine Self-Management Outcome: 4 (good)  Gastrointestinal Gastrointestinal Symptoms Reported: No symptoms reported   Nutrition Risk Screen (CP): No indicators present  Genitourinary Genitourinary Symptoms Reported: No symptoms reported    Integumentary Integumentary Symptoms Reported: No symptoms reported    Musculoskeletal Musculoskelatal Symptoms Reviewed: No symptoms reported Musculoskeletal Conditions: Joint pain Musculoskeletal Management Strategies: Routine screening, Exercise Musculoskeletal Self-Management Outcome: 4 (good) Musculoskeletal Comment: s/p right shoulder rotator cuff repair in 2019, patient is working [employer] [facility] in the past year?: No Number of falls in past year: 1 or less Was there an injury with Fall?: No Fall Risk Category Calculator: 0 Patient Fall Risk Level: Low Fall Risk Patient at Risk for Falls Due to: No Fall Risks Fall risk Follow up: Falls evaluation completed  Psychosocial Psychosocial Symptoms Reported: No symptoms reported   Major Change/Loss/Stressor/Fears (CP): Denies Quality of Family Relationships: supportive, involved, helpful Do you feel physically threatened by others?: No      12/08/2023   11:07 AM  Depression screen PHQ 2/9  Decreased Interest 0  Down, Depressed, Hopeless 0  PHQ - 2 Score 0    Vitals:   12/08/23 1038  BP: 110/70  Pulse: 91    Medications Reviewed Today     Reviewed by Kaylene Pascal, RN (Registered Nurse) on 12/08/23 at 1054  Med List Status: <None>   Medication Order Taking? Sig Documenting Provider Last Dose Status Informant  amLODipine  (NORVASC ) 5 MG tablet [id number] Yes TAKE 1 TABLET BY MOUTH DAILY AT Lindajo Resides, MD  Active   aspirin 81 MG tablet [id number] Yes Take 81 mg by mouth daily.  Patient taking differently: Take 81 mg by mouth daily. Per Dr. Annabel Barns take M-F, Skip weekends   [provider]  Active    atorvastatin  (LIPITOR) 20 MG tablet [id number] Yes ONE TAB PO MWF Cleave Curling, MD  Active   calcium  carbonate (OS-CAL) 600 MG TABS [id number] Yes Take 600 mg by mouth 2 (two) times daily with a meal. [provider]  Active   Cholecalciferol (VITAMIN D PO) [id number] Yes Take 2,000 mg by mouth daily.  [provider]  Active   escitalopram  (LEXAPRO ) 10 MG tablet [id number] Yes Take 1/2 tablet po daily Cleave Curling, MD  Active   estradiol  (ESTRACE ) 0.1 MG/GM vaginal cream [id number] Yes Apply one gram vaginally twice weekly. Chrzanowski, Jami B, NP  Active   guaiFENesin (MUCINEX) 600 MG 12 hr tablet [id number] Yes Take 600 mg by mouth 2 (two) times daily.  Patient taking differently: Take 600 mg by mouth daily as needed for to loosen phlegm.   [provider]  Active   losartan -hydrochlorothiazide  (HYZAAR) 100-12.5 MG tablet [id number] Yes Take 1 tablet by mouth daily. Cleave Curling, MD  Active   Magnesium 250 MG TABS [id number] Yes Take by mouth. [provider]  Active            Med Note Aviva Lemmings   Wed Jun 04, 2015  9:45 AM) Received from: [facility]  Multiple Vitamin (THERA) TABS [id number] Yes Take 1 tablet by mouth daily. [provider]  Active            Med Note Brenita Callow, TAYLOR A   Wed Jun 09, 2016  1:24 PM) Received from: [facility] Received Sig: Take 1 tablet by mouth daily.  Omega-3 Fatty Acids (FISH OIL) 1000 MG CAPS [id number] Yes Take 1 capsule by mouth. [provider]  Active            Med Note Aviva Lemmings   Wed Jun 04, 2015  9:45 AM) Received from: [facility]            Recommendation:   PCP Follow-up with Dr. Cleave Curling on 04/26/24 at 09:20 AM Specialty provider follow-up with [facility] for OT as directed   Follow Up Plan:   Closing From:  Complex Care Management  Louanne Roussel RN BSN CCM [facility]  [facility], [facility]  Health Nurse Care Coordinator  Direct Dial: [phone] Website: [email or web address]

## 2023-12-08 NOTE — Therapy (Signed)
 OUTPATIENT OCCUPATIONAL THERAPY ORTHO TREATMENT  Patient Name: Jessica Crosby MRN: [id number] DOB:1951-02-17, 73 y.o., female Today's Date: 12/08/2023   END OF SESSION:  OT End of Session - 12/08/23 0922     Visit Number 2    Number of Visits 8    Date for OT Re-Evaluation 12/25/23    Authorization Type [employer], $15 copay    Authorization Time Period no auth required    Progress Note Due on Visit 10    OT Start Time 0850    OT Stop Time 0928    OT Time Calculation (min) 38 min    Activity Tolerance Patient tolerated treatment well    Behavior During Therapy [facility] for tasks assessed/performed           Past Medical History:  Diagnosis Date   Arthritis    GERD (gastroesophageal reflux disease)    Hx of scarlet fever    as achild   Hypertension    Thyroid  disease    hyperparathyroidism   Vitamin D deficiency    Past Surgical History:  Procedure Laterality Date   CHOLECYSTECTOMY  06/21/1997   PARATHYROIDECTOMY Right 06/21/2008   Patient Active Problem List   Diagnosis Date Noted   Atrophic vaginitis 10/24/2023   Elevated liver function tests 08/21/2023   Other abnormal glucose 08/21/2023   Overweight with body mass index (BMI) of 27 to 27.9 in adult 07/11/2023   Encounter for general adult medical examination w/o abnormal findings 04/25/2023   Pure hypercholesterolemia 04/25/2023   Colon cancer screening 10/06/2022   Obesity 10/06/2022   Second degree hemorrhoids 10/06/2022   Night sweats 04/13/2020   Primary insomnia 04/13/2020   Situational anxiety 04/13/2020   Vitamin D deficiency disease 04/13/2020   Allergic rhinitis due to other allergen 06/10/2017   Depression 06/10/2017   Pericarditis 06/10/2017   Scarlet fever 06/10/2017   Single skin nodule 06/25/2014   Multiple thyroid  nodules 01/08/2014   Essential hypertension 12/25/2013   H/O hyperparathyroidism 12/25/2013   Abdominal pain, chronic, epigastric 05/09/2012   Hypercalcemia  12/30/2008   Family history of malignant neoplasm of breast 03/30/2006   Fibrocystic breast disease 03/30/2006   Enthesopathy of hip region 08/30/2005   Esophageal reflux 12/27/2003   Mixed hyperlipidemia 02/12/2003   Osteopenia 01/26/2000    PCP: Dr. Cleave Curling  REFERRING PROVIDER: Dr. Neil Balls  ONSET DATE: ~ 2 month ago  REFERRING DIAG: right shoulder pain  THERAPY DIAG:  Acute pain of right shoulder  Other symptoms and signs involving the musculoskeletal system  Rationale for Evaluation and Treatment: Rehabilitation  SUBJECTIVE:   SUBJECTIVE STATEMENT: S: It feeling better.   PERTINENT HISTORY: Pt is a 73 y/o female presenting with right shoulder pain from unknown origin. Pt with PMH of right RC repair in 2019.   PRECAUTIONS: None  WEIGHT BEARING RESTRICTIONS: No  PAIN:  Are you having pain? No  FALLS: Has patient fallen in last 6 months? No  PLOF: Independent  PATIENT GOALS: To have less pain and be able to move the RUE  NEXT MD VISIT:   OBJECTIVE:   HAND DOMINANCE: Right  ADLs: Overall ADLs: Pt reports difficulty with reaching overhead and out to the side, UB dressing, sleeping, lifting items. Pt with difficulty with cleaning and cooking at times.   FUNCTIONAL OUTCOME MEASURES: Upper Extremity Functional Scale (UEFS): 39/80=49%  UPPER EXTREMITY ROM:       Assessed in sitting, er/IR adducted  Active ROM Right eval  Shoulder flexion 122  Shoulder abduction 128  Shoulder internal rotation 90  Shoulder external rotation 46  (Blank rows = not tested)    Assessed in supine, er/IR adducted  Active ROM Right eval  Shoulder flexion 168  Shoulder abduction 175  Shoulder internal rotation 90  Shoulder external rotation 60  (Blank rows = not tested)  UPPER EXTREMITY MMT:     Assessed in sitting, er/IR adducted  MMT Right eval  Shoulder flexion 4/5  Shoulder abduction 4-/5  Shoulder internal rotation 5/5  Shoulder external rotation  4/5  (Blank rows = not tested)   OBSERVATIONS: mod fascial restrictions in left upper arm, trapezius, and scapular regions   TODAY'S TREATMENT:                                                                                                                              DATE:  12/08/23 -Myofascial release to right upper arm, trapezius, and scapular regions to decrease pain and fascial restrictions and increase joint ROM -A/ROM: supine-protraction, flexion, horizontal abduction, er, abduction, 10 reps -Proximal shoulder strengthening: supine-paddles, criss cross, circles each direction, 10 reps -A/ROM: standing-protraction, flexion, horizontal abduction, er, abduction, 10 reps -Proximal shoulder strengthening: standing-paddles, criss cross, circles each direction, 10 reps -Scapular theraband: red-row, extension, retraction, 10 reps -Overhead lacing: pt seated, lacing from top down then reversing -Ball on wall: 1' flexion, 1' abduction,  -UBE: level 2, 3' forward, 3' reverse, pace: 8.0  Eval:  -AA/ROM: sitting-protraction, flexion, er, abduction, horizontal abduction, 10 reps    PATIENT EDUCATION: Education details: A/ROM Person educated: Patient Education method: Programmer, multimedia, Facilities manager, and Handouts Education comprehension: verbalized understanding and returned demonstration  HOME EXERCISE PROGRAM: Eval: AA/ROM 12/08/23: A/ROM  GOALS: Goals reviewed with patient? Yes   SHORT TERM GOALS: Target date: 12/25/23  Pt will be provided with and educated on HEP to improve mobility in RUE required for use during ADL completion.   Goal status: IN PROGRESS  2.  Pt will increase RUE A/ROM by 20+ degrees to improve ability to use RUE when reaching overhead or behind back during dressing and bathing tasks  Goal status: IN PROGRESS  3.  Pt will increase RUE strength to 4+/5 or greater to improve ability to lift or carry items during meal preparation/housework/yardwork tasks.   Goal  status: IN PROGRESS  4.  Pt will decrease pain in RUE to 3/10 or less to improve ability to sleep for 2+ consecutive hours without waking due to pain.   Goal status: IN PROGRESS  5. Pt will decrease RUE fascial restrictions to min amounts or less to improve mobility required for functional reaching tasks.   Goal status: IN PROGRESS     ASSESSMENT:  CLINICAL IMPRESSION: Pt reports she is feeling well, HEP is going well. Initiated manual techniques, no passive stretching needed as ROM is WNL. Progressed to A/ROM in both supine and seated, updated HEP for A/ROM. Pt completing scapular theraband and shoulder stability work. Good activity tolerance  with min fatigue at end of tasks. No increased pain. Pt with A/ROM WNL for all tasks. Verbal cuing for form and technique.    PERFORMANCE DEFICITS: in functional skills including in functional skills including ADLs, IADLs, coordination, tone, ROM, strength, pain, fascial restrictions, muscle spasms, and UE functional use     PLAN:  OT FREQUENCY: 2x/week  OT DURATION: 4 weeks  PLANNED INTERVENTIONS: 97168 OT Re-evaluation, 97535 self care/ADL training, 16109 therapeutic exercise, 97530 therapeutic activity, 97112 neuromuscular re-education, 97140 manual therapy, 97014 electrical stimulation unattended, patient/family education, and DME and/or AE instructions  CONSULTED AND AGREED WITH PLAN OF CARE: Patient  PLAN FOR NEXT SESSION: Follow up on HEP, manual techniques prn, passive stretching, A/ROM, shoulder and scapular strengthening and stability work   [employer], OTR/L  [phone] 12/08/2023, 9:28 AM

## 2023-12-08 NOTE — Patient Instructions (Signed)
 Visit Information  Thank you for taking time to visit with me today.   Please call the care guide team at [phone] if you need to cancel, schedule, or reschedule an appointment.   Please call 1-800-273-TALK (toll free, 24 hour hotline) if you are experiencing a Mental Health or Behavioral Health Crisis or need someone to talk to.  Louanne Roussel RN BSN CCM [place]  [facility], [facility] Health Nurse Care Coordinator  Direct Dial: [phone] Website: [email or web address]

## 2023-12-12 ENCOUNTER — Ambulatory Visit (HOSPITAL_COMMUNITY): Admitting: Occupational Therapy

## 2023-12-12 ENCOUNTER — Encounter (HOSPITAL_COMMUNITY): Payer: Self-pay | Admitting: Occupational Therapy

## 2023-12-12 DIAGNOSIS — R29898 Other symptoms and signs involving the musculoskeletal system: Secondary | ICD-10-CM

## 2023-12-12 DIAGNOSIS — M25511 Pain in right shoulder: Secondary | ICD-10-CM | POA: Diagnosis not present

## 2023-12-12 NOTE — Patient Instructions (Signed)

## 2023-12-12 NOTE — Therapy (Signed)
 OUTPATIENT OCCUPATIONAL THERAPY ORTHO TREATMENT  Patient Name: Jessica Crosby MRN: [id number] DOB:1950-09-25, 73 y.o., female Today's Date: 12/12/2023   END OF SESSION:  OT End of Session - 12/12/23 1007     Visit Number 3    Number of Visits 8    Date for OT Re-Evaluation 12/25/23    Authorization Type [employer], $15 copay    Authorization Time Period no auth required    Progress Note Due on Visit 10    OT Start Time 0927    OT Stop Time 1006    OT Time Calculation (min) 39 min    Activity Tolerance Patient tolerated treatment well    Behavior During Therapy WFL for tasks assessed/performed            Past Medical History:  Diagnosis Date   Arthritis    GERD (gastroesophageal reflux disease)    Hx of scarlet fever    as achild   Hypertension    Thyroid  disease    hyperparathyroidism   Vitamin D deficiency    Past Surgical History:  Procedure Laterality Date   CHOLECYSTECTOMY  06/21/1997   PARATHYROIDECTOMY Right 06/21/2008   Patient Active Problem List   Diagnosis Date Noted   Atrophic vaginitis 10/24/2023   Elevated liver function tests 08/21/2023   Other abnormal glucose 08/21/2023   Overweight with body mass index (BMI) of 27 to 27.9 in adult 07/11/2023   Encounter for general adult medical examination w/o abnormal findings 04/25/2023   Pure hypercholesterolemia 04/25/2023   Colon cancer screening 10/06/2022   Obesity 10/06/2022   Second degree hemorrhoids 10/06/2022   Night sweats 04/13/2020   Primary insomnia 04/13/2020   Situational anxiety 04/13/2020   Vitamin D deficiency disease 04/13/2020   Allergic rhinitis due to other allergen 06/10/2017   Depression 06/10/2017   Pericarditis 06/10/2017   Scarlet fever 06/10/2017   Single skin nodule 06/25/2014   Multiple thyroid  nodules 01/08/2014   Essential hypertension 12/25/2013   H/O hyperparathyroidism 12/25/2013   Abdominal pain, chronic, epigastric 05/09/2012   Hypercalcemia  12/30/2008   Family history of malignant neoplasm of breast 03/30/2006   Fibrocystic breast disease 03/30/2006   Enthesopathy of hip region 08/30/2005   Esophageal reflux 12/27/2003   Mixed hyperlipidemia 02/12/2003   Osteopenia 01/26/2000    PCP: Dr. Catheryn Slocumb  REFERRING PROVIDER: Dr. Norleen Gavel  ONSET DATE: ~ 2 month ago  REFERRING DIAG: right shoulder pain  THERAPY DIAG:  Acute pain of right shoulder  Other symptoms and signs involving the musculoskeletal system  Rationale for Evaluation and Treatment: Rehabilitation  SUBJECTIVE:   SUBJECTIVE STATEMENT: S: I did have a little knot over the weekend.   PERTINENT HISTORY: Pt is a 73 y/o female presenting with right shoulder pain from unknown origin. Pt with PMH of right RC repair in 2019.   PRECAUTIONS: None  WEIGHT BEARING RESTRICTIONS: No  PAIN:  Are you having pain? Yes: NPRS scale: 3/10 Pain location: right shoulder Pain description: sore Aggravating factors: abduction Relieving factors: resting  FALLS: Has patient fallen in last 6 months? No  PLOF: Independent  PATIENT GOALS: To have less pain and be able to move the RUE   OBJECTIVE:   HAND DOMINANCE: Right  ADLs: Overall ADLs: Pt reports difficulty with reaching overhead and out to the side, UB dressing, sleeping, lifting items. Pt with difficulty with cleaning and cooking at times.   FUNCTIONAL OUTCOME MEASURES: Upper Extremity Functional Scale (UEFS): 39/80=49%  UPPER EXTREMITY ROM:  Assessed in sitting, er/IR adducted  Active ROM Right eval  Shoulder flexion 122  Shoulder abduction 128  Shoulder internal rotation 90  Shoulder external rotation 46  (Blank rows = not tested)    Assessed in supine, er/IR adducted  Active ROM Right eval  Shoulder flexion 168  Shoulder abduction 175  Shoulder internal rotation 90  Shoulder external rotation 60  (Blank rows = not tested)  UPPER EXTREMITY MMT:     Assessed in sitting,  er/IR adducted  MMT Right eval  Shoulder flexion 4/5  Shoulder abduction 4-/5  Shoulder internal rotation 5/5  Shoulder external rotation 4/5  (Blank rows = not tested)   OBSERVATIONS: mod fascial restrictions in left upper arm, trapezius, and scapular regions   TODAY'S TREATMENT:                                                                                                                              DATE:  12/12/23 -Myofascial release to right upper arm, trapezius, and scapular regions to decrease pain and fascial restrictions and increase joint ROM -Strengthening: 1#, supine-protraction, flexion, horizontal abduction, er, abduction, 10 reps -Proximal shoulder strengthening: 1#, supine-paddles, criss cross, circles each direction, 10 reps -Scapular theraband: red-row, extension, retraction, 10 reps -Ball on wall: 1' flexion, 1' abduction -Overhead lacing: pt seated, lacing from top down then reversing -UBE: level 2, 3' forward, 3' reverse, pace: 6.0  12/08/23 -Myofascial release to right upper arm, trapezius, and scapular regions to decrease pain and fascial restrictions and increase joint ROM -A/ROM: supine-protraction, flexion, horizontal abduction, er, abduction, 10 reps -Proximal shoulder strengthening: supine-paddles, criss cross, circles each direction, 10 reps -A/ROM: standing-protraction, flexion, horizontal abduction, er, abduction, 10 reps -Proximal shoulder strengthening: standing-paddles, criss cross, circles each direction, 10 reps -Scapular theraband: red-row, extension, retraction, 10 reps -Overhead lacing: pt seated, lacing from top down then reversing -Ball on wall: 1' flexion, 1' abduction,  -UBE: level 2, 3' forward, 3' reverse, pace: 8.0  Eval:  -AA/ROM: sitting-protraction, flexion, er, abduction, horizontal abduction, 10 reps    PATIENT EDUCATION: Education details: scapular theraband-red Person educated: Patient Education method: Explanation,  Demonstration, and Handouts Education comprehension: verbalized understanding and returned demonstration  HOME EXERCISE PROGRAM: Eval: AA/ROM 12/08/23: A/ROM 12/12/23: red scapular theraband  GOALS: Goals reviewed with patient? Yes   SHORT TERM GOALS: Target date: 12/25/23  Pt will be provided with and educated on HEP to improve mobility in RUE required for use during ADL completion.   Goal status: IN PROGRESS  2.  Pt will increase RUE A/ROM by 20+ degrees to improve ability to use RUE when reaching overhead or behind back during dressing and bathing tasks  Goal status: IN PROGRESS  3.  Pt will increase RUE strength to 4+/5 or greater to improve ability to lift or carry items during meal preparation/housework/yardwork tasks.   Goal status: IN PROGRESS  4.  Pt will decrease pain in RUE to 3/10 or less to improve  ability to sleep for 2+ consecutive hours without waking due to pain.   Goal status: IN PROGRESS  5. Pt will decrease RUE fascial restrictions to min amounts or less to improve mobility required for functional reaching tasks.   Goal status: IN PROGRESS     ASSESSMENT:  CLINICAL IMPRESSION: Pt reports she is feeling good, felt great after previous session. Pt progressing to strengthening in supine today, ROM continues to be excellent. Improved activity tolerance to ball on wall today. Continued with scapular theraband and added to HEP. Verbal cuing for form and technique.    PERFORMANCE DEFICITS: in functional skills including in functional skills including ADLs, IADLs, coordination, tone, ROM, strength, pain, fascial restrictions, muscle spasms, and UE functional use     PLAN:  OT FREQUENCY: 2x/week  OT DURATION: 4 weeks  PLANNED INTERVENTIONS: 97168 OT Re-evaluation, 97535 self care/ADL training, 02889 therapeutic exercise, 97530 therapeutic activity, 97112 neuromuscular re-education, 97140 manual therapy, 97014 electrical stimulation unattended,  patient/family education, and DME and/or AE instructions  CONSULTED AND AGREED WITH PLAN OF CARE: Patient  PLAN FOR NEXT SESSION: Follow up on HEP, manual techniques prn, passive stretching, A/ROM, shoulder and scapular strengthening and stability work   [employer], OTR/L  [phone] 12/12/2023, 10:07 AM

## 2023-12-14 ENCOUNTER — Ambulatory Visit (HOSPITAL_COMMUNITY): Admitting: Occupational Therapy

## 2023-12-14 ENCOUNTER — Encounter (HOSPITAL_COMMUNITY): Payer: Self-pay | Admitting: Occupational Therapy

## 2023-12-14 DIAGNOSIS — R29898 Other symptoms and signs involving the musculoskeletal system: Secondary | ICD-10-CM

## 2023-12-14 DIAGNOSIS — M25511 Pain in right shoulder: Secondary | ICD-10-CM | POA: Diagnosis not present

## 2023-12-14 NOTE — Therapy (Signed)
 OUTPATIENT OCCUPATIONAL THERAPY ORTHO TREATMENT  Patient Name: Jessica Crosby MRN: [id number] DOB:10/28/1950, 73 y.o., female Today's Date: 12/14/2023   END OF SESSION:  OT End of Session - 12/14/23 1137     Visit Number 4    Number of Visits 8    Date for OT Re-Evaluation 12/25/23    Authorization Type [employer], $15 copay    Authorization Time Period no auth required    Progress Note Due on Visit 10    OT Start Time 1103    OT Stop Time 1141    OT Time Calculation (min) 38 min    Activity Tolerance Patient tolerated treatment well    Behavior During Therapy WFL for tasks assessed/performed             Past Medical History:  Diagnosis Date   Arthritis    GERD (gastroesophageal reflux disease)    Hx of scarlet fever    as achild   Hypertension    Thyroid  disease    hyperparathyroidism   Vitamin D deficiency    Past Surgical History:  Procedure Laterality Date   CHOLECYSTECTOMY  06/21/1997   PARATHYROIDECTOMY Right 06/21/2008   Patient Active Problem List   Diagnosis Date Noted   Atrophic vaginitis 10/24/2023   Elevated liver function tests 08/21/2023   Other abnormal glucose 08/21/2023   Overweight with body mass index (BMI) of 27 to 27.9 in adult 07/11/2023   Encounter for general adult medical examination w/o abnormal findings 04/25/2023   Pure hypercholesterolemia 04/25/2023   Colon cancer screening 10/06/2022   Obesity 10/06/2022   Second degree hemorrhoids 10/06/2022   Night sweats 04/13/2020   Primary insomnia 04/13/2020   Situational anxiety 04/13/2020   Vitamin D deficiency disease 04/13/2020   Allergic rhinitis due to other allergen 06/10/2017   Depression 06/10/2017   Pericarditis 06/10/2017   Scarlet fever 06/10/2017   Single skin nodule 06/25/2014   Multiple thyroid  nodules 01/08/2014   Essential hypertension 12/25/2013   H/O hyperparathyroidism 12/25/2013   Abdominal pain, chronic, epigastric 05/09/2012    Hypercalcemia 12/30/2008   Family history of malignant neoplasm of breast 03/30/2006   Fibrocystic breast disease 03/30/2006   Enthesopathy of hip region 08/30/2005   Esophageal reflux 12/27/2003   Mixed hyperlipidemia 02/12/2003   Osteopenia 01/26/2000    PCP: Dr. Catheryn Slocumb  REFERRING PROVIDER: Dr. Norleen Gavel  ONSET DATE: ~ 2 month ago  REFERRING DIAG: right shoulder pain  THERAPY DIAG:  Acute pain of right shoulder  Other symptoms and signs involving the musculoskeletal system  Rationale for Evaluation and Treatment: Rehabilitation  SUBJECTIVE:   SUBJECTIVE STATEMENT: S: I like the band  PERTINENT HISTORY: Pt is a 73 y/o female presenting with right shoulder pain from unknown origin. Pt with PMH of right RC repair in 2019.   PRECAUTIONS: None  WEIGHT BEARING RESTRICTIONS: No  PAIN:  Are you having pain? No  FALLS: Has patient fallen in last 6 months? No  PLOF: Independent  PATIENT GOALS: To have less pain and be able to move the RUE   OBJECTIVE:   HAND DOMINANCE: Right  ADLs: Overall ADLs: Pt reports difficulty with reaching overhead and out to the side, UB dressing, sleeping, lifting items. Pt with difficulty with cleaning and cooking at times.   FUNCTIONAL OUTCOME MEASURES: Upper Extremity Functional Scale (UEFS): 39/80=49%  UPPER EXTREMITY ROM:       Assessed in sitting, er/IR adducted  Active ROM Right eval  Shoulder flexion 122  Shoulder abduction  128  Shoulder internal rotation 90  Shoulder external rotation 46  (Blank rows = not tested)    Assessed in supine, er/IR adducted  Active ROM Right eval  Shoulder flexion 168  Shoulder abduction 175  Shoulder internal rotation 90  Shoulder external rotation 60  (Blank rows = not tested)  UPPER EXTREMITY MMT:     Assessed in sitting, er/IR adducted  MMT Right eval  Shoulder flexion 4/5  Shoulder abduction 4-/5  Shoulder internal rotation 5/5  Shoulder external rotation 4/5   (Blank rows = not tested)   OBSERVATIONS: mod fascial restrictions in left upper arm, trapezius, and scapular regions   TODAY'S TREATMENT:                                                                                                                              DATE:  12/14/23 -Myofascial release to right upper arm, trapezius, and scapular regions to decrease pain and fascial restrictions and increase joint ROM -Strengthening: 1#, supine-protraction, flexion, horizontal abduction, er, abduction, 12 reps -Proximal shoulder strengthening: 1#, supine-paddles, criss cross, circles each direction, 10 reps -Strengthening: 1#, standing-protraction, flexion, horizontal abduction, er, abduction, 10 reps -Scapular theraband: red-row, extension, retraction, 10 reps -Ball on wall: 1' flexion, 1' abduction -Overhead lacing: pt seated, lacing from top down then reversing, 1# wrist weight -UBE: level 2, 3' forward, 3' reverse, pace: 6.0  12/12/23 -Myofascial release to right upper arm, trapezius, and scapular regions to decrease pain and fascial restrictions and increase joint ROM -Strengthening: 1#, supine-protraction, flexion, horizontal abduction, er, abduction, 10 reps -Proximal shoulder strengthening: 1#, supine-paddles, criss cross, circles each direction, 10 reps -Scapular theraband: red-row, extension, retraction, 10 reps -Ball on wall: 1' flexion, 1' abduction -Overhead lacing: pt seated, lacing from top down then reversing -UBE: level 2, 3' forward, 3' reverse, pace: 6.0  12/08/23 -Myofascial release to right upper arm, trapezius, and scapular regions to decrease pain and fascial restrictions and increase joint ROM -A/ROM: supine-protraction, flexion, horizontal abduction, er, abduction, 10 reps -Proximal shoulder strengthening: supine-paddles, criss cross, circles each direction, 10 reps -A/ROM: standing-protraction, flexion, horizontal abduction, er, abduction, 10 reps -Proximal  shoulder strengthening: standing-paddles, criss cross, circles each direction, 10 reps -Scapular theraband: red-row, extension, retraction, 10 reps -Overhead lacing: pt seated, lacing from top down then reversing -Ball on wall: 1' flexion, 1' abduction,  -UBE: level 2, 3' forward, 3' reverse, pace: 8.0    PATIENT EDUCATION: Education details: scapular theraband-red Person educated: Patient Education method: Explanation, Demonstration, and Handouts Education comprehension: verbalized understanding and returned demonstration  HOME EXERCISE PROGRAM: Eval: AA/ROM 12/08/23: A/ROM 12/12/23: red scapular theraband  GOALS: Goals reviewed with patient? Yes   SHORT TERM GOALS: Target date: 12/25/23  Pt will be provided with and educated on HEP to improve mobility in RUE required for use during ADL completion.   Goal status: IN PROGRESS  2.  Pt will increase RUE A/ROM by 20+ degrees to improve ability to use RUE  when reaching overhead or behind back during dressing and bathing tasks  Goal status: IN PROGRESS  3.  Pt will increase RUE strength to 4+/5 or greater to improve ability to lift or carry items during meal preparation/housework/yardwork tasks.   Goal status: IN PROGRESS  4.  Pt will decrease pain in RUE to 3/10 or less to improve ability to sleep for 2+ consecutive hours without waking due to pain.   Goal status: IN PROGRESS  5. Pt will decrease RUE fascial restrictions to min amounts or less to improve mobility required for functional reaching tasks.   Goal status: IN PROGRESS     ASSESSMENT:  CLINICAL IMPRESSION: Pt reports she is feeling good, HEP is going well. Continued with RUE strengthening and stability work, adding strengthening in standing and adding weight to overhead lacing. Pt with good activity tolerance, fatigue with overhead lacing and ball on wall tasks. ROM is full throughout tasks. Verbal cuing for form and technique.    PERFORMANCE DEFICITS: in  functional skills including in functional skills including ADLs, IADLs, coordination, tone, ROM, strength, pain, fascial restrictions, muscle spasms, and UE functional use     PLAN:  OT FREQUENCY: 2x/week  OT DURATION: 4 weeks  PLANNED INTERVENTIONS: 97168 OT Re-evaluation, 97535 self care/ADL training, 02889 therapeutic exercise, 97530 therapeutic activity, 97112 neuromuscular re-education, 97140 manual therapy, 97014 electrical stimulation unattended, patient/family education, and DME and/or AE instructions  CONSULTED AND AGREED WITH PLAN OF CARE: Patient  PLAN FOR NEXT SESSION: Follow up on HEP, manual techniques prn, passive stretching, A/ROM, shoulder and scapular strengthening and stability work   [employer], OTR/L  [phone] 12/14/2023, 11:44 AM

## 2023-12-18 ENCOUNTER — Other Ambulatory Visit: Payer: Self-pay | Admitting: Internal Medicine

## 2023-12-19 ENCOUNTER — Encounter (HOSPITAL_COMMUNITY): Payer: Self-pay | Admitting: Occupational Therapy

## 2023-12-19 ENCOUNTER — Ambulatory Visit (HOSPITAL_COMMUNITY): Admitting: Occupational Therapy

## 2023-12-19 DIAGNOSIS — M25511 Pain in right shoulder: Secondary | ICD-10-CM

## 2023-12-19 DIAGNOSIS — R29898 Other symptoms and signs involving the musculoskeletal system: Secondary | ICD-10-CM

## 2023-12-19 NOTE — Therapy (Signed)
 OUTPATIENT OCCUPATIONAL THERAPY ORTHO TREATMENT  Patient Name: Jessica Crosby MRN: [id number] DOB:[birth year], 73 y.o., female Today's Date: 12/19/2023   END OF SESSION:  OT End of Session - 12/19/23 1013     Visit Number 5    Number of Visits 8    Date for OT Re-Evaluation 12/25/23    Authorization Type [employer], $15 copay    Authorization Time Period no auth required    Progress Note Due on Visit 10    OT Start Time [phone]    OT Stop Time 1008    OT Time Calculation (min) 40 min    Activity Tolerance Patient tolerated treatment well    Behavior During Therapy WFL for tasks assessed/performed              Past Medical History:  Diagnosis Date   Arthritis    GERD (gastroesophageal reflux disease)    Hx of scarlet fever    as achild   Hypertension    Thyroid  disease    hyperparathyroidism   Vitamin D deficiency    Past Surgical History:  Procedure Laterality Date   CHOLECYSTECTOMY  06/21/1997   PARATHYROIDECTOMY Right 06/21/2008   Patient Active Problem List   Diagnosis Date Noted   Atrophic vaginitis 10/24/2023   Elevated liver function tests 08/21/2023   Other abnormal glucose 08/21/2023   Overweight with body mass index (BMI) of 27 to 27.9 in adult 07/11/2023   Encounter for general adult medical examination w/o abnormal findings 04/25/2023   Pure hypercholesterolemia 04/25/2023   Colon cancer screening 10/06/2022   Obesity 10/06/2022   Second degree hemorrhoids 10/06/2022   Night sweats 04/13/2020   Primary insomnia 04/13/2020   Situational anxiety 04/13/2020   Vitamin D deficiency disease 04/13/2020   Allergic rhinitis due to other allergen 06/10/2017   Depression 06/10/2017   Pericarditis 06/10/2017   Scarlet fever 06/10/2017   Single skin nodule 06/25/2014   Multiple thyroid  nodules 01/08/2014   Essential hypertension 12/25/2013   H/O hyperparathyroidism 12/25/2013   Abdominal pain, chronic, epigastric 05/09/2012    Hypercalcemia 12/30/2008   Family history of malignant neoplasm of breast 03/30/2006   Fibrocystic breast disease 03/30/2006   Enthesopathy of hip region 08/30/2005   Esophageal reflux 12/27/2003   Mixed hyperlipidemia 02/12/2003   Osteopenia 01/26/2000    PCP: Dr. Catheryn Slocumb  REFERRING PROVIDER: Dr. Norleen Gavel  ONSET DATE: ~ 2 month ago  REFERRING DIAG: right shoulder pain  THERAPY DIAG:  Acute pain of right shoulder  Other symptoms and signs involving the musculoskeletal system  Rationale for Evaluation and Treatment: Rehabilitation  SUBJECTIVE:   SUBJECTIVE STATEMENT: S: When I walk and get back my shoulders hurt.  PERTINENT HISTORY: Pt is a 73 y/o female presenting with right shoulder pain from unknown origin. Pt with PMH of right RC repair in 2019.   PRECAUTIONS: None  WEIGHT BEARING RESTRICTIONS: No  PAIN:  Are you having pain? No  FALLS: Has patient fallen in last 6 months? No  PLOF: Independent  PATIENT GOALS: To have less pain and be able to move the RUE   OBJECTIVE:   HAND DOMINANCE: Right  ADLs: Overall ADLs: Pt reports difficulty with reaching overhead and out to the side, UB dressing, sleeping, lifting items. Pt with difficulty with cleaning and cooking at times.   FUNCTIONAL OUTCOME MEASURES: Upper Extremity Functional Scale (UEFS): 39/80=49%  UPPER EXTREMITY ROM:       Assessed in sitting, er/IR adducted  Active ROM Right eval  Shoulder flexion 122  Shoulder abduction 128  Shoulder internal rotation 90  Shoulder external rotation 46  (Blank rows = not tested)    Assessed in supine, er/IR adducted  Active ROM Right eval  Shoulder flexion 168  Shoulder abduction 175  Shoulder internal rotation 90  Shoulder external rotation 60  (Blank rows = not tested)  UPPER EXTREMITY MMT:     Assessed in sitting, er/IR adducted  MMT Right eval  Shoulder flexion 4/5  Shoulder abduction 4-/5  Shoulder internal rotation 5/5   Shoulder external rotation 4/5  (Blank rows = not tested)   OBSERVATIONS: mod fascial restrictions in left upper arm, trapezius, and scapular regions   TODAY'S TREATMENT:                                                                                                                              DATE:  12/19/23 -Myofascial release to right upper arm, trapezius, and scapular regions to decrease pain and fascial restrictions and increase joint ROM -Strengthening: 2#, supine-protraction, flexion, horizontal abduction, er, abduction, 10 reps -Proximal shoulder strengthening: 2#, supine-paddles, criss cross, circles each direction, 10 reps -Strengthening: 1#, standing-protraction, flexion, horizontal abduction, er, abduction, 12 reps -Proximal shoulder strengthening: 1#, supine-paddles, criss cross, circles each direction, 12 reps -Ball on wall: 1' flexion, 1' abduction -Overhead lacing: pt seated, lacing from top down then reversing, 2# wrist weight -Functional reaching: pt wearing 2# wrist weight, moving items from middle shelf of overhead cabinet to countertop, moving items from bottom shelf to middle shelf, then moving items from counter to bottom shelf. 1 rest break.   12/14/23 -Myofascial release to right upper arm, trapezius, and scapular regions to decrease pain and fascial restrictions and increase joint ROM -Strengthening: 1#, supine-protraction, flexion, horizontal abduction, er, abduction, 12 reps -Proximal shoulder strengthening: 1#, supine-paddles, criss cross, circles each direction, 10 reps -Strengthening: 1#, standing-protraction, flexion, horizontal abduction, er, abduction, 10 reps -Scapular theraband: red-row, extension, retraction, 10 reps -Ball on wall: 1' flexion, 1' abduction -Overhead lacing: pt seated, lacing from top down then reversing, 1# wrist weight -UBE: level 2, 3' forward, 3' reverse, pace: 6.0  12/12/23 -Myofascial release to right upper arm, trapezius, and  scapular regions to decrease pain and fascial restrictions and increase joint ROM -Strengthening: 1#, supine-protraction, flexion, horizontal abduction, er, abduction, 10 reps -Proximal shoulder strengthening: 1#, supine-paddles, criss cross, circles each direction, 10 reps -Scapular theraband: red-row, extension, retraction, 10 reps -Ball on wall: 1' flexion, 1' abduction -Overhead lacing: pt seated, lacing from top down then reversing -UBE: level 2, 3' forward, 3' reverse, pace: 6.0     PATIENT EDUCATION: Education details: reviewed HEP Person educated: Patient Education method: Explanation, Demonstration, and Handouts Education comprehension: verbalized understanding and returned demonstration  HOME EXERCISE PROGRAM: Eval: AA/ROM 12/08/23: A/ROM 12/12/23: red scapular theraband  GOALS: Goals reviewed with patient? Yes   SHORT TERM GOALS: Target date: 12/25/23  Pt will be provided with and educated on HEP  to improve mobility in RUE required for use during ADL completion.   Goal status: IN PROGRESS  2.  Pt will increase RUE A/ROM by 20+ degrees to improve ability to use RUE when reaching overhead or behind back during dressing and bathing tasks  Goal status: IN PROGRESS  3.  Pt will increase RUE strength to 4+/5 or greater to improve ability to lift or carry items during meal preparation/housework/yardwork tasks.   Goal status: IN PROGRESS  4.  Pt will decrease pain in RUE to 3/10 or less to improve ability to sleep for 2+ consecutive hours without waking due to pain.   Goal status: IN PROGRESS  5. Pt will decrease RUE fascial restrictions to min amounts or less to improve mobility required for functional reaching tasks.   Goal status: IN PROGRESS     ASSESSMENT:  CLINICAL IMPRESSION: Pt reports she feels good, does not have constant pain throughout day anymore, occasionally has pain with certain movements. Has reduced the weight she is using in workout classes  successfully. Continued with manual techniques and strengthening tasks, increased weight to 2# in supine, maintained 1# in standing. Pt completing functional reaching and stability work targeting improved use of the RUE for ADLs. Verbal cuing for form and technique.    PERFORMANCE DEFICITS: in functional skills including in functional skills including ADLs, IADLs, coordination, tone, ROM, strength, pain, fascial restrictions, muscle spasms, and UE functional use     PLAN:  OT FREQUENCY: 2x/week  OT DURATION: 4 weeks  PLANNED INTERVENTIONS: 97168 OT Re-evaluation, 97535 self care/ADL training, 02889 therapeutic exercise, 97530 therapeutic activity, 97112 neuromuscular re-education, 97140 manual therapy, 97014 electrical stimulation unattended, patient/family education, and DME and/or AE instructions  CONSULTED AND AGREED WITH PLAN OF CARE: Patient  PLAN FOR NEXT SESSION: REASSESSMENT-possible d/c with HEP   Sonny Cory, OTR/L  [phone] 12/19/2023, 10:13 AM

## 2023-12-22 ENCOUNTER — Encounter (HOSPITAL_COMMUNITY): Payer: Self-pay | Admitting: Occupational Therapy

## 2023-12-22 ENCOUNTER — Ambulatory Visit (HOSPITAL_COMMUNITY): Attending: Orthopedic Surgery | Admitting: Occupational Therapy

## 2023-12-22 DIAGNOSIS — M25511 Pain in right shoulder: Secondary | ICD-10-CM | POA: Insufficient documentation

## 2023-12-22 DIAGNOSIS — R29898 Other symptoms and signs involving the musculoskeletal system: Secondary | ICD-10-CM | POA: Diagnosis not present

## 2023-12-22 NOTE — Therapy (Signed)
 OUTPATIENT OCCUPATIONAL THERAPY ORTHO TREATMENT REASSESSMENT AND DISCHARGE  Patient Name: Jessica Crosby MRN: [id number] DOB:December 18, 1950, 73 y.o., female Today's Date: 12/22/2023  OCCUPATIONAL THERAPY DISCHARGE SUMMARY  Visits from Start of Care: 6  Current functional level related to goals / functional outcomes: See below. Pt has met all goals, made excellent progress and demonstrates ROM and strength WNL, no pain.    Remaining deficits: Slightly decreased activity tolerance   Education / Equipment: HEP for strengthening   Patient agrees to discharge. Patient goals were met. Patient is being discharged due to meeting the stated rehab goals..     END OF SESSION:  OT End of Session - 12/22/23 1045     Visit Number 6    Number of Visits 8    Date for OT Re-Evaluation 12/25/23    Authorization Type [employer], $15 copay    Authorization Time Period no auth required    Progress Note Due on Visit 10    OT Start Time 1022    OT Stop Time 1054    OT Time Calculation (min) 32 min    Activity Tolerance Patient tolerated treatment well    Behavior During Therapy WFL for tasks assessed/performed               Past Medical History:  Diagnosis Date   Arthritis    GERD (gastroesophageal reflux disease)    Hx of scarlet fever    as achild   Hypertension    Thyroid  disease    hyperparathyroidism   Vitamin D deficiency    Past Surgical History:  Procedure Laterality Date   CHOLECYSTECTOMY  06/21/1997   PARATHYROIDECTOMY Right 06/21/2008   Patient Active Problem List   Diagnosis Date Noted   Atrophic vaginitis 10/24/2023   Elevated liver function tests 08/21/2023   Other abnormal glucose 08/21/2023   Overweight with body mass index (BMI) of 27 to 27.9 in adult 07/11/2023   Encounter for general adult medical examination w/o abnormal findings 04/25/2023   Pure hypercholesterolemia 04/25/2023   Colon cancer screening 10/06/2022   Obesity 10/06/2022    Second degree hemorrhoids 10/06/2022   Night sweats 04/13/2020   Primary insomnia 04/13/2020   Situational anxiety 04/13/2020   Vitamin D deficiency disease 04/13/2020   Allergic rhinitis due to other allergen 06/10/2017   Depression 06/10/2017   Pericarditis 06/10/2017   Scarlet fever 06/10/2017   Single skin nodule 06/25/2014   Multiple thyroid  nodules 01/08/2014   Essential hypertension 12/25/2013   H/O hyperparathyroidism 12/25/2013   Abdominal pain, chronic, epigastric 05/09/2012   Hypercalcemia 12/30/2008   Family history of malignant neoplasm of breast 03/30/2006   Fibrocystic breast disease 03/30/2006   Enthesopathy of hip region 08/30/2005   Esophageal reflux 12/27/2003   Mixed hyperlipidemia 02/12/2003   Osteopenia 01/26/2000    PCP: Dr. Catheryn Slocumb  REFERRING PROVIDER: Dr. Norleen Gavel  ONSET DATE: ~ 2 month ago  REFERRING DIAG: right shoulder pain  THERAPY DIAG:  Acute pain of right shoulder  Other symptoms and signs involving the musculoskeletal system  Rationale for Evaluation and Treatment: Rehabilitation  SUBJECTIVE:   SUBJECTIVE STATEMENT: S: I did my exercises last night.   PERTINENT HISTORY: Pt is a 73 y/o female presenting with right shoulder pain from unknown origin. Pt with PMH of right RC repair in 2019.   PRECAUTIONS: None  WEIGHT BEARING RESTRICTIONS: No  PAIN:  Are you having pain? No  FALLS: Has patient fallen in last 6 months? No  PLOF: Independent  PATIENT GOALS: To have less pain and be able to move the RUE   OBJECTIVE:   HAND DOMINANCE: Right  ADLs: Overall ADLs: Pt reports difficulty with reaching overhead and out to the side, UB dressing, sleeping, lifting items. Pt with difficulty with cleaning and cooking at times.   FUNCTIONAL OUTCOME MEASURES: Upper Extremity Functional Scale (UEFS): 39/80=49% 12/22/23: 80/80=100%  UPPER EXTREMITY ROM:       Assessed in sitting, er/IR adducted  Active ROM Right eval  Right 12/22/23  Shoulder flexion 122 165  Shoulder abduction 128 170  Shoulder internal rotation 90 90  Shoulder external rotation 46 81  (Blank rows = not tested)    Assessed in supine, er/IR adducted  Passive ROM Right eval Right 12/22/23  Shoulder flexion 168 170  Shoulder abduction 175 180  Shoulder internal rotation 90 90  Shoulder external rotation 60 90  (Blank rows = not tested)  UPPER EXTREMITY MMT:     Assessed in sitting, er/IR adducted  MMT Right eval Right 12/22/23  Shoulder flexion 4/5 5/5  Shoulder abduction 4-/5 5/5  Shoulder internal rotation 5/5 5/5  Shoulder external rotation 4/5 5/5  (Blank rows = not tested)   OBSERVATIONS: mod fascial restrictions in left upper arm, trapezius, and scapular regions   TODAY'S TREATMENT:                                                                                                                              DATE:   12/22/23 -Myofascial release to right upper arm, trapezius, and scapular regions to decrease pain and fascial restrictions and increase joint ROM -Theraband strengthening: green-protraction, flexion, abduction, er, IR, horizontal abduction, 10 reps -UBE: level 2, 3' forward, 3' reverse, pace: 6.0  12/19/23 -Myofascial release to right upper arm, trapezius, and scapular regions to decrease pain and fascial restrictions and increase joint ROM -Strengthening: 2#, supine-protraction, flexion, horizontal abduction, er, abduction, 10 reps -Proximal shoulder strengthening: 2#, supine-paddles, criss cross, circles each direction, 10 reps -Strengthening: 1#, standing-protraction, flexion, horizontal abduction, er, abduction, 12 reps -Proximal shoulder strengthening: 1#, supine-paddles, criss cross, circles each direction, 12 reps -Ball on wall: 1' flexion, 1' abduction -Overhead lacing: pt seated, lacing from top down then reversing, 2# wrist weight -Functional reaching: pt wearing 2# wrist weight, moving items from  middle shelf of overhead cabinet to countertop, moving items from bottom shelf to middle shelf, then moving items from counter to bottom shelf. 1 rest break.   12/14/23 -Myofascial release to right upper arm, trapezius, and scapular regions to decrease pain and fascial restrictions and increase joint ROM -Strengthening: 1#, supine-protraction, flexion, horizontal abduction, er, abduction, 12 reps -Proximal shoulder strengthening: 1#, supine-paddles, criss cross, circles each direction, 10 reps -Strengthening: 1#, standing-protraction, flexion, horizontal abduction, er, abduction, 10 reps -Scapular theraband: red-row, extension, retraction, 10 reps -Ball on wall: 1' flexion, 1' abduction -Overhead lacing: pt seated, lacing from top down then reversing, 1# wrist weight -UBE: level 2, 3'  forward, 3' reverse, pace: 6.0     PATIENT EDUCATION: Education details: theraband strengthening Person educated: Patient Education method: Explanation, Demonstration, and Handouts Education comprehension: verbalized understanding and returned demonstration  HOME EXERCISE PROGRAM: Eval: AA/ROM 12/08/23: A/ROM 12/12/23: red scapular theraband 12/22/23: theraband strengthening-green  GOALS: Goals reviewed with patient? Yes   SHORT TERM GOALS: Target date: 12/25/23  Pt will be provided with and educated on HEP to improve mobility in RUE required for use during ADL completion.   Goal status: MET  2.  Pt will increase RUE A/ROM by 20+ degrees to improve ability to use RUE when reaching overhead or behind back during dressing and bathing tasks  Goal status: MET  3.  Pt will increase RUE strength to 4+/5 or greater to improve ability to lift or carry items during meal preparation/housework/yardwork tasks.   Goal status: MET  4.  Pt will decrease pain in RUE to 3/10 or less to improve ability to sleep for 2+ consecutive hours without waking due to pain.   Goal status: MET  5. Pt will decrease RUE  fascial restrictions to min amounts or less to improve mobility required for functional reaching tasks.   Goal status: MET    ASSESSMENT:  CLINICAL IMPRESSION: Reassessment completed this session, pt has made excellent progress and has met all of her goals. Pt demonstrates ROM and strength WNL, reports no pain today and is able to sleep even on her right side. Pt is using her RUE as dominant during all ADLs and functional reaching tasks. Pt completed updated HEP for strengthening. Pt is agreeable to discharge today.    PERFORMANCE DEFICITS: in functional skills including in functional skills including ADLs, IADLs, coordination, tone, ROM, strength, pain, fascial restrictions, muscle spasms, and UE functional use     PLAN:  OT FREQUENCY: 2x/week  OT DURATION: 4 weeks  PLANNED INTERVENTIONS: 97168 OT Re-evaluation, 97535 self care/ADL training, 02889 therapeutic exercise, 97530 therapeutic activity, 97112 neuromuscular re-education, 97140 manual therapy, 97014 electrical stimulation unattended, patient/family education, and DME and/or AE instructions  CONSULTED AND AGREED WITH PLAN OF CARE: Patient  PLAN FOR NEXT SESSION: Discharge today   Sonny Cory, OTR/L  [phone] 12/22/2023, 10:55 AM

## 2023-12-22 NOTE — Patient Instructions (Signed)

## 2023-12-26 ENCOUNTER — Ambulatory Visit: Payer: Self-pay

## 2023-12-26 ENCOUNTER — Encounter (HOSPITAL_COMMUNITY): Admitting: Occupational Therapy

## 2023-12-26 NOTE — Telephone Encounter (Signed)
 FYI Only or Action Required?: FYI only for provider.  Patient was last seen in primary care on 10/24/2023 by Jarold Medici, MD.  Called Nurse Triage reporting urinary symptoms.  Symptoms began several days ago.  Interventions attempted: Nothing.  Symptoms are: unchanged.  Triage Disposition: See Physician Within 24 Hours appointment scheduled for 7/10  Patient/caregiver understands and will follow disposition?: No, wishes to speak with PCP   Copied from CRM [id number]. Topic: Clinical - Medical Advice >> Dec 26, 2023  3:45 PM Rachelle R wrote: Reason for CRM: Patient states she has taken 2 at home UTI tests and both have come back positive. Symptoms include itching and dark urine. Would like to know since she took the at home test if she could be prescribed something for the UTI.  Patient can be reached at [phone] Reason for Disposition  Side (flank) or lower back pain present  Answer Assessment - Initial Assessment Questions 1. SYMPTOM: What's the main symptom you're concerned about? (e.g., frequency, incontinence)     Itching and back pain 2. ONSET: When did the  itching and back pain  start?     A couple of days ago 3. PAIN: Is there any pain? If Yes, ask: How bad is it? (Scale: 1-10; mild, moderate, severe)     Back pain, mild 4. CAUSE: What do you think is causing the symptoms?     Possible uti 5. OTHER SYMPTOMS: Do you have any other symptoms? (e.g., blood in urine, fever, flank pain, pain with urination)     Denies fever, denies burning with urination, frequency or urgency  Protocols used: Urinary Symptoms-A-AH

## 2023-12-27 ENCOUNTER — Ambulatory Visit (INDEPENDENT_AMBULATORY_CARE_PROVIDER_SITE_OTHER)

## 2023-12-27 ENCOUNTER — Ambulatory Visit (HOSPITAL_COMMUNITY): Payer: Self-pay

## 2023-12-27 ENCOUNTER — Telehealth: Payer: Self-pay | Admitting: Nurse Practitioner

## 2023-12-27 ENCOUNTER — Ambulatory Visit
Admission: RE | Admit: 2023-12-27 | Discharge: 2023-12-27 | Disposition: A | Discharge status: Home / Self Care | Payer: Self-pay | Source: Ambulatory Visit | Attending: Nurse Practitioner | Admitting: Nurse Practitioner

## 2023-12-27 ENCOUNTER — Other Ambulatory Visit: Payer: Self-pay

## 2023-12-27 VITALS — BP 129/81 | HR 88 | Temp 98.4°F | Resp 20

## 2023-12-27 DIAGNOSIS — R399 Unspecified symptoms and signs involving the genitourinary system: Secondary | ICD-10-CM | POA: Insufficient documentation

## 2023-12-27 DIAGNOSIS — R109 Unspecified abdominal pain: Secondary | ICD-10-CM | POA: Insufficient documentation

## 2023-12-27 DIAGNOSIS — Z87442 Personal history of urinary calculi: Secondary | ICD-10-CM | POA: Diagnosis not present

## 2023-12-27 DIAGNOSIS — R1032 Left lower quadrant pain: Secondary | ICD-10-CM | POA: Diagnosis not present

## 2023-12-27 DIAGNOSIS — Z9049 Acquired absence of other specified parts of digestive tract: Secondary | ICD-10-CM | POA: Diagnosis not present

## 2023-12-27 LAB — POCT URINALYSIS DIP (MANUAL ENTRY)
Bilirubin, UA: NEGATIVE
Blood, UA: NEGATIVE
Glucose, UA: NEGATIVE mg/dL
Nitrite, UA: NEGATIVE
Spec Grav, UA: 1.02 (ref 1.010–1.025)
Urobilinogen, UA: 0.2 U/dL
pH, UA: 6.5 (ref 5.0–8.0)

## 2023-12-27 MED ORDER — NITROFURANTOIN MONOHYD MACRO 100 MG PO CAPS: 100.0000 mg | ORAL_CAPSULE | Freq: Two times a day (BID) | ORAL | 0 refills | Status: DC

## 2023-12-27 MED ORDER — TAMSULOSIN HCL 0.4 MG PO CAPS: 0.4000 mg | ORAL_CAPSULE | Freq: Every day | ORAL | 0 refills | Status: DC

## 2023-12-27 NOTE — ED Provider Notes (Signed)
 [facility]    CSN: [id number] Arrival date & time: 12/27/23  1032      History   Chief Complaint Chief Complaint  Patient presents with   Urinary Retention    UTI - Entered by patient    HPI Jessica Crosby is a 73 y.o. female.   The history is provided by the patient.   Patient presents with a 1 week history of intermittent flank pain, dark urine, and urinary frequency.  Patient reports that the flank pain started suddenly, she developed nausea with the onset of the pain, and then it resolved.  She states since that time, she has had continued urinary frequency, states that the flank pain has improved.  She states that she also had dark urine, but that it lightened after she began drinking water.  Patient reports prior history of kidney stones.  She denies fever, chills, abdominal pain, dysuria, decreased urine stream, hematuria, or recurrent UTIs.   Past Medical History:  Diagnosis Date   Arthritis    GERD (gastroesophageal reflux disease)    Hx of scarlet fever    as achild   Hypertension    Thyroid  disease    hyperparathyroidism   Vitamin D deficiency     Patient Active Problem List   Diagnosis Date Noted   Atrophic vaginitis 10/24/2023   Elevated liver function tests 08/21/2023   Other abnormal glucose 08/21/2023   Overweight with body mass index (BMI) of 27 to 27.9 in adult 07/11/2023   Encounter for general adult medical examination w/o abnormal findings 04/25/2023   Pure hypercholesterolemia 04/25/2023   Colon cancer screening 10/06/2022   Obesity 10/06/2022   Second degree hemorrhoids 10/06/2022   Night sweats 04/13/2020   Primary insomnia 04/13/2020   Situational anxiety 04/13/2020   Vitamin D deficiency disease 04/13/2020   Allergic rhinitis due to other allergen 06/10/2017   Depression 06/10/2017   Pericarditis 06/10/2017   Scarlet fever 06/10/2017   Single skin nodule 06/25/2014   Multiple thyroid  nodules 01/08/2014   Essential  hypertension 12/25/2013   H/O hyperparathyroidism 12/25/2013   Abdominal pain, chronic, epigastric 05/09/2012   Hypercalcemia 12/30/2008   Family history of malignant neoplasm of breast 03/30/2006   Fibrocystic breast disease 03/30/2006   Enthesopathy of hip region 08/30/2005   Esophageal reflux 12/27/2003   Mixed hyperlipidemia 02/12/2003   Osteopenia 01/26/2000    Past Surgical History:  Procedure Laterality Date   CHOLECYSTECTOMY  06/21/1997   PARATHYROIDECTOMY Right 06/21/2008    OB History     Gravida  3   Para  3   Term  3   Preterm      AB      Living  3      SAB      IAB      Ectopic      Multiple      Live Births               Home Medications    Prior to Admission medications   Medication Sig Start Date End Date Taking? Authorizing Provider  amLODipine  (NORVASC ) 5 MG tablet TAKE 1 TABLET BY MOUTH DAILY AT NIGHT 10/24/23   Jarold Medici, MD  aspirin 81 MG tablet Take 81 mg by mouth daily. Patient taking differently: Take 81 mg by mouth daily. Per Dr. Tammi take M-F, Skip weekends    [provider]  atorvastatin  (LIPITOR) 20 MG tablet ONE TAB PO MWF 10/24/23   Jarold Medici, MD  calcium   carbonate (OS-CAL) 600 MG TABS Take 600 mg by mouth 2 (two) times daily with a meal.    [provider]  Cholecalciferol (VITAMIN D PO) Take 2,000 mg by mouth daily.     [provider]  escitalopram  (LEXAPRO ) 10 MG tablet TAKE 1 TABLET BY MOUTH EVERY DAY 12/19/23   Jarold Medici, MD  estradiol  (ESTRACE ) 0.1 MG/GM vaginal cream Apply one gram vaginally twice weekly. 10/04/23   Chrzanowski, Jami B, NP  guaiFENesin (MUCINEX) 600 MG 12 hr tablet Take 600 mg by mouth 2 (two) times daily. Patient taking differently: Take 600 mg by mouth daily as needed for to loosen phlegm.    [provider]  losartan -hydrochlorothiazide  (HYZAAR) 100-12.5 MG tablet Take 1 tablet by mouth daily. 10/24/23   Jarold Medici, MD  Magnesium 250 MG TABS  Take by mouth.    [provider]  Multiple Vitamin (THERA) TABS Take 1 tablet by mouth daily.    [provider]  Omega-3 Fatty Acids (FISH OIL) 1000 MG CAPS Take 1 capsule by mouth.    [provider]    Family History Family History  Problem Relation Age of Onset   Breast cancer Sister 2   Prostate cancer Father 54   Breast cancer Maternal Aunt 81   Prostate cancer Paternal Uncle    Throat cancer Paternal Grandfather        pipe smoker   Breast cancer Sister 60   Lymphoma Sister 55   Leukemia Sister 42       as a result of chemotherapy from her lymphoma   Cancer Sister 7       non hodgins lymphoma/ leukemia   Pancreatic cancer Maternal Aunt 84   Colon cancer Maternal Aunt        diagnosed in her 43s   Thyroid  cancer Maternal Aunt        dx in her 50s   Leukemia Maternal Aunt        diagnosed in her 10s   Breast cancer Cousin        diagnosed in her 64s   Prostate cancer Paternal Uncle     Social History Social History   Tobacco Use   Smoking status: Never    Passive exposure: Never   Smokeless tobacco: Never  Vaping Use   Vaping status: Never Used  Substance Use Topics   Alcohol use: No   Drug use: No     Allergies   Patient has no active allergies.   Review of Systems Review of Systems Per HPI  Physical Exam Triage Vital Signs ED Triage Vitals  Encounter Vitals Group     BP 12/27/23 1039 129/81     Girls Systolic BP Percentile --      Girls Diastolic BP Percentile --      Boys Systolic BP Percentile --      Boys Diastolic BP Percentile --      Pulse Rate 12/27/23 1039 88     Resp 12/27/23 1039 20     Temp 12/27/23 1039 98.4 F (36.9 C)     Temp Source 12/27/23 1039 Oral     SpO2 12/27/23 1039 97 %     Weight --      Height --      Head Circumference --      Peak Flow --      Pain Score 12/27/23 1042 5     Pain Loc --      Pain Education --  Exclude from Growth Chart --    No data found.  Updated  Vital Signs BP 129/81 (BP Location: Right Arm)   Pulse 88   Temp 98.4 F (36.9 C) (Oral)   Resp 20   LMP 06/21/1998   SpO2 97%   Visual Acuity Right Eye Distance:   Left Eye Distance:   Bilateral Distance:    Right Eye Near:   Left Eye Near:    Bilateral Near:     Physical Exam Vitals and nursing note reviewed.  Constitutional:      General: She is not in acute distress.    Appearance: Normal appearance.  HENT:     Head: Normocephalic.  Eyes:     Extraocular Movements: Extraocular movements intact.     Conjunctiva/sclera: Conjunctivae normal.     Pupils: Pupils are equal, round, and reactive to light.  Cardiovascular:     Rate and Rhythm: Normal rate and regular rhythm.     Pulses: Normal pulses.     Heart sounds: Normal heart sounds.  Pulmonary:     Effort: Pulmonary effort is normal. No respiratory distress.     Breath sounds: Normal breath sounds. No stridor. No wheezing, rhonchi or rales.  Abdominal:     General: Bowel sounds are normal.     Palpations: Abdomen is soft.     Tenderness: There is no abdominal tenderness. There is no right CVA tenderness or left CVA tenderness.  Musculoskeletal:     Cervical back: Normal range of motion.  Skin:    General: Skin is warm and dry.  Neurological:     General: No focal deficit present.     Mental Status: She is alert and oriented to person, place, and time.  Psychiatric:        Mood and Affect: Mood normal.        Behavior: Behavior normal.     Regarding send present will likely review We started the Paxlovid  for 2 days patient   UC Treatments / Results  Labs (all labs ordered are listed, but only abnormal results are displayed) Labs Reviewed  POCT URINALYSIS DIP (MANUAL ENTRY) - Abnormal; Notable for the following components:      Result Value   Ketones, POC UA trace (5) (*)    Protein Ur, POC trace (*)    Leukocytes, UA Trace (*)    All other components within normal limits    EKG   Radiology No  results found.  Procedures Procedures (including critical care time)  Medications Ordered in UC Medications - No data to display  Initial Impression / Assessment and Plan / UC Course  I have reviewed the triage vital signs and the nursing notes.  Pertinent labs & imaging results that were available during my care of the patient were reviewed by me and considered in my medical decision making (see chart for details).  Urinalysis does not indicate an obvious urinary tract infection with trace leukocytes and trace protein.  Urine culture is pending along with an abdominal x-ray.  Based on the patient's description of her symptoms, symptoms are consistent with a possible kidney stone..  She continues to have urinary frequency, but no other symptoms, she has had resolution of her flank pain.  Will treat for possible kidney stone with tamsulosin  0.4 mg and cover for UTI with Macrobid  100 mg.  Urine strainer was provided.  Supportive care recommendations were provided discussed with the patient to include continuing fluids, rest, over-the-counter analgesics, developing a toileting schedule,  and avoiding caffeine.  Patient was advised if the result of the culture is negative and she is continuing to experience symptoms, recommend follow-up with her PCP for further evaluation.  Patient was also given strict ER follow-up precautions.  Patient was in agreement with this plan of care and verbalizes understanding.  All questions were answered.  Patient stable for discharge.  Final Clinical Impressions(s) / UC Diagnoses   Final diagnoses:  None   Discharge Instructions   None    ED Prescriptions   None    PDMP not reviewed this encounter.   Gilmer Etta PARAS, NP 12/27/23 1140

## 2023-12-27 NOTE — Telephone Encounter (Signed)
 Called patient to discuss results of the abdominal x-ray.  Spoke with patient, verified patient with 2 patient identifiers.  Patient was advised that the x-ray did show a renal stone on the right side along with mild stool burden.  Advised patient she may begin over-the-counter stool softener such as senna.  Will have patient continue with current treatment recommendations until her urine culture results are received.  Patient was advised she will be contacted when the results of the culture are received.  Patient was in agreement with this plan of care and verbalizes understanding.  All questions were answered.  Patient stable for discharge.

## 2023-12-27 NOTE — ED Triage Notes (Signed)
 Pt reports intermittent left flank pain, emesis beginning of last week. Reports emesis improved but reports dark urine started end of last week. Pt reports this has also improved but reports left flank pain, urinary frequency remains. Denies abd pain, known fevers.

## 2023-12-27 NOTE — Discharge Instructions (Addendum)
 X-ray of the abdomen and urine culture are pending.  I will give you a call when the results of the x-ray are received sometime today.  The culture results may take anywhere from 3 to 4 days.  You will be contacted if the results of the culture are abnormal.  You will be advised to discontinue use of the antibiotics prescribed today if your culture result is negative.  You will also access to your results via MyChart. Take medication as prescribed. Continue to drink plenty of fluids.  Recommend at least 8-10 8 ounce glasses of water daily. You may take over-the-counter Tylenol  as needed for pain, fever, or general discomfort. Develop a toileting schedule that will allow you urinate at least every 2 hours. Avoid caffeine such as tea, soda, or coffee while symptoms persist. Go to the emergency department immediately if you experience worsening flank pain, or if you develop new symptoms of fever, chills, with worsening urinary symptoms. If the result of your culture is negative and you are continuing to experience symptoms, please follow-up with your primary care physician for further evaluation. Follow-up as needed.

## 2023-12-29 ENCOUNTER — Ambulatory Visit: Payer: Self-pay | Admitting: Internal Medicine

## 2023-12-29 ENCOUNTER — Encounter (HOSPITAL_COMMUNITY): Admitting: Occupational Therapy

## 2023-12-29 LAB — URINE CULTURE

## 2024-01-03 ENCOUNTER — Encounter (HOSPITAL_COMMUNITY): Admitting: Occupational Therapy

## 2024-01-27 ENCOUNTER — Other Ambulatory Visit: Payer: Self-pay | Admitting: Internal Medicine

## 2024-01-28 ENCOUNTER — Other Ambulatory Visit: Payer: Self-pay | Admitting: Internal Medicine

## 2024-01-28 DIAGNOSIS — E7800 Pure hypercholesterolemia, unspecified: Secondary | ICD-10-CM

## 2024-02-06 ENCOUNTER — Ambulatory Visit (INDEPENDENT_AMBULATORY_CARE_PROVIDER_SITE_OTHER): Payer: Self-pay | Admitting: Internal Medicine

## 2024-02-06 ENCOUNTER — Encounter: Payer: Self-pay | Admitting: Internal Medicine

## 2024-02-06 VITALS — BP 122/80 | HR 69 | Temp 98.6°F | Ht 64.0 in | Wt 156.4 lb

## 2024-02-06 DIAGNOSIS — F418 Other specified anxiety disorders: Secondary | ICD-10-CM

## 2024-02-06 DIAGNOSIS — N200 Calculus of kidney: Secondary | ICD-10-CM | POA: Diagnosis not present

## 2024-02-06 DIAGNOSIS — R1012 Left upper quadrant pain: Secondary | ICD-10-CM

## 2024-02-06 DIAGNOSIS — R109 Unspecified abdominal pain: Secondary | ICD-10-CM | POA: Diagnosis not present

## 2024-02-06 LAB — POCT URINALYSIS DIP (CLINITEK)
Glucose, UA: NEGATIVE mg/dL
Nitrite, UA: NEGATIVE
POC PROTEIN,UA: NEGATIVE
Spec Grav, UA: 1.02 (ref 1.010–1.025)
Urobilinogen, UA: 0.2 U/dL
pH, UA: 6.5 (ref 5.0–8.0)

## 2024-02-06 MED ORDER — TAMSULOSIN HCL 0.4 MG PO CAPS: 0.4000 mg | ORAL_CAPSULE | Freq: Every day | ORAL | 0 refills | Status: DC

## 2024-02-06 NOTE — Patient Instructions (Signed)
 Kidney Stones  Kidney stones are solid, rock-like deposits that form inside of the kidneys. The kidneys are a pair of organs that make urine. A kidney stone may form in a kidney and move into other parts of the urinary tract, including the tubes that connect the kidneys to the bladder (ureters), the bladder, and the tube that carries urine out of the body (urethra). As the stone moves through these areas, it can cause intense pain and block the flow of urine. Kidney stones are created when high levels of certain minerals are found in the urine. The stones are usually passed out of the body through urination, but in some cases, medical treatment may be needed to remove them. What are the causes? Kidney stones may be caused by: A condition in which certain glands produce too much parathyroid hormone (primary hyperparathyroidism), which causes too much calcium  buildup in the blood. A buildup of uric acid crystals in the bladder (hyperuricosuria). Uric acid is a chemical that the body produces when you eat certain foods. It usually leaves the body in the urine. Narrowing (stricture) of one or both of the ureters. A kidney blockage that is present at birth (congenital obstruction). Past surgery on the kidney or the ureters. What increases the risk? The following factors may make you more likely to develop this condition: Having had a kidney stone in the past. Having a family history of kidney stones. Not drinking enough water. Eating a diet that is high in protein, salt (sodium), or sugar. Being overweight or obese. What are the signs or symptoms? Symptoms of a kidney stone may include: Pain in the side of the abdomen, right below the ribs (flank pain). Pain usually spreads (radiates) to the groin. Needing to urinate often or urgently. Painful urination. Blood in the urine (hematuria). Nausea. Vomiting. Fever and chills. How is this diagnosed? This condition may be diagnosed based on: Your  symptoms and medical history. A physical exam. Blood tests. Urine tests. These may be done before and after the stone passes out of your body through urination. Imaging tests, such as a CT scan, abdominal X-ray, or ultrasound. A procedure to examine the inside of the bladder (cystoscopy). How is this treated? Treatment for kidney stones depends on the size, location, and makeup of the stones. Kidney stones will often pass out of the body through urination. You may need to: Increase your fluid intake to help pass the stone. In some cases, you may be given fluids through an IV and may need to be monitored in the hospital. Take medicine for pain. Make changes in your diet to help prevent kidney stones from coming back. Sometimes, procedures are needed to remove a kidney stone. This may involve: A procedure to break up kidney stones using: A focused beam of light (laser therapy). Shock waves (extracorporeal shock wave lithotripsy). Surgery to remove kidney stones. This may be needed if you have severe pain or have stones that block your urinary tract. Follow these instructions at home: Medicines Take over-the-counter and prescription medicines only as told by your health care provider. Ask your health care provider if the medicine prescribed to you requires you to avoid driving or using heavy machinery. Eating and drinking Drink enough fluid to keep your urine pale yellow. You may be instructed to drink at least 8-10 glasses of water each day. This will help you pass the kidney stone. If directed, change your diet. This may include: Limiting how much sodium you eat. Eating more fruits  and vegetables. Limiting how much animal protein you eat. Animal proteins include red meat, poultry, fish, and eggs. Eating a normal amount of calcium  (1,000-1,300 mg per day). Follow instructions from your health care provider about eating or drinking restrictions. General instructions Collect urine samples as  told by your health care provider. You may need to collect a urine sample: 24 hours after you pass the stone. 8-12 weeks after you pass the kidney stone, and every 6-12 months after that. Strain your urine every time you urinate, for as long as directed. Use the strainer that your health care provider recommends. Do not throw out the kidney stone after passing it. Keep the stone so it can be tested by your health care provider. Testing the makeup of your kidney stone may help prevent you from getting kidney stones in the future. Keep all follow-up visits. You may need follow-up X-rays or ultrasounds to make sure that your stone has passed. How is this prevented? To prevent another kidney stone: Drink enough fluid to keep your urine pale yellow. This is the best way to prevent kidney stones. Eat a healthy diet. Follow recommendations from your health care provider about foods to avoid. Recommendations vary depending on the type of kidney stone that you have. You may be instructed to eat a low-protein diet. Maintain a healthy weight. Where to find more information National Kidney Foundation (NKF): www.kidney.org [facility]): www.urologyhealth.org Contact a health care provider if: You have pain that gets worse or does not get better with medicine. Get help right away if: You have a fever or chills. You develop severe pain. You develop new abdominal pain. You faint. You are unable to urinate. Summary Kidney stones are solid, rock-like deposits that form inside of the kidneys. Kidney stones can cause nausea, vomiting, blood in the urine, abdominal pain, and the urge to urinate often. Treatment for kidney stones depends on the size, location, and makeup of the stones. Kidney stones will often pass out of the body through urination. Kidney stones can be prevented by drinking enough fluids, eating a healthy diet, and maintaining a healthy weight. This information is not intended  to replace advice given to you by your health care provider. Make sure you discuss any questions you have with your health care provider. Document Revised: 09/16/2021 Document Reviewed: 09/16/2021 Elsevier Patient Education  2024 [employer].

## 2024-02-06 NOTE — Progress Notes (Unsigned)
 I,Victoria T Emmitt, CMA,acting as a Neurosurgeon for Jessica LOISE Slocumb, MD.,have documented all relevant documentation on the behalf of Jessica LOISE Slocumb, MD,as directed by  Jessica LOISE Slocumb, MD while in the presence of Jessica LOISE Slocumb, MD.  Subjective:  Patient ID: IDALEE FOXWORTHY , female    DOB: October 05, 1950 , 73 y.o.   MRN: [id number]  Chief Complaint  Patient presents with  . Flank Pain    Patient presents today for left lower flank pain. She states going to urgent care on July 7th. She was told she had a kidney stone but never saw it pass. She did experience extreme pain for weeks. She still has pain today she admits it is not as bad.     HPI Discussed the use of AI scribe software for clinical note transcription with the patient, who gave verbal consent to proceed.  History of Present Illness Jessica Crosby is a 73 year old female with kidney stones who presents with persistent flank pain.  She has been experiencing persistent pain in the area of her kidneys since July, following a diagnosis of a kidney stone at an urgent care facility in [place]. The pain was initially severe and constant for over two weeks, described as 'twenty-four seven pain'. Although the intensity has decreased, the pain remains localized to her kidney area and does not radiate across her back.  In July, x-rays at urgent care indicated a small calculus. She was prescribed tamsulosin  and an antibiotic. The pain has improved but has not completely resolved. She has not observed the passage of the stone and has not experienced hematuria. However, her urine has been darker than usual despite efforts to stay hydrated.  She has a history of at least two prior kidney stones, which occurred before moving to her current location. She recalls visiting a urologist in the past, possibly at [facility] near [place], but has not had a kidney stone since seeing her current provider.  Her current medications  include tamsulosin , which she completed a course of, and she has been taking Tylenol  for pain relief, typically two tablets a day. She is concerned about the potential effects of taking too much Tylenol .  She moved from [place], [place]  to her current location. She drinks caffeine-free soda but does not consume sweet tea or spinach.   Hypertension This is a chronic problem. The current episode started more than 1 year ago. The problem has been gradually improving since onset. The problem is controlled. Pertinent negatives include no blurred vision or palpitations. Risk factors for coronary artery disease include dyslipidemia and post-menopausal state. Past treatments include angiotensin blockers and diuretics. Compliance problems include exercise.      Past Medical History:  Diagnosis Date  . Arthritis   . GERD (gastroesophageal reflux disease)   . Hx of scarlet fever    as achild  . Hypertension   . Thyroid  disease    hyperparathyroidism  . Vitamin D deficiency      Family History  Problem Relation Age of Onset  . Breast cancer Sister 50  . Prostate cancer Father 30  . Breast cancer Maternal Aunt 81  . Prostate cancer Paternal Uncle   . Throat cancer Paternal Grandfather        pipe smoker  . Breast cancer Sister 43  . Lymphoma Sister 35  . Leukemia Sister 22       as a result of chemotherapy from her lymphoma  . Cancer Sister 59  non hodgins lymphoma/ leukemia  . Pancreatic cancer Maternal Aunt 65  . Colon cancer Maternal Aunt        diagnosed in her 74s  . Thyroid  cancer Maternal Aunt        dx in her 28s  . Leukemia Maternal Aunt        diagnosed in her 47s  . Breast cancer Cousin        diagnosed in her 46s  . Prostate cancer Paternal Uncle      Current Outpatient Medications:  .  amLODipine  (NORVASC ) 5 MG tablet, TAKE 1 TABLET BY MOUTH DAILY AT NIGHT, Disp: 90 tablet, Rfl: 1 .  aspirin 81 MG tablet, Take 81 mg by mouth daily. (Patient taking differently:  Take 81 mg by mouth daily. Per Dr. Tammi take M-F, Skip weekends), Disp: , Rfl:  .  atorvastatin  (LIPITOR) 20 MG tablet, ONE TAB BY MOUTH MONDAY,WEDNESDAY,FRIDAY., Disp: 135 tablet, Rfl: 0 .  calcium  carbonate (OS-CAL) 600 MG TABS, Take 600 mg by mouth 2 (two) times daily with a meal., Disp: , Rfl:  .  Cholecalciferol (VITAMIN D PO), Take 2,000 mg by mouth daily. , Disp: , Rfl:  .  escitalopram  (LEXAPRO ) 10 MG tablet, TAKE 1 TABLET BY MOUTH EVERY DAY, Disp: 90 tablet, Rfl: 1 .  estradiol  (ESTRACE ) 0.1 MG/GM vaginal cream, Apply one gram vaginally twice weekly., Disp: 42.5 g, Rfl: 1 .  guaiFENesin (MUCINEX) 600 MG 12 hr tablet, Take 600 mg by mouth 2 (two) times daily. (Patient taking differently: Take 600 mg by mouth daily as needed for to loosen phlegm.), Disp: , Rfl:  .  losartan -hydrochlorothiazide  (HYZAAR) 100-12.5 MG tablet, Take 1 tablet by mouth daily., Disp: 90 tablet, Rfl: 2 .  Magnesium 250 MG TABS, Take by mouth., Disp: , Rfl:  .  Multiple Vitamin (THERA) TABS, Take 1 tablet by mouth daily., Disp: , Rfl:  .  Omega-3 Fatty Acids (FISH OIL) 1000 MG CAPS, Take 1 capsule by mouth., Disp: , Rfl:  .  tamsulosin  (FLOMAX ) 0.4 MG CAPS capsule, Take 1 capsule (0.4 mg total) by mouth daily after supper., Disp: 30 capsule, Rfl: 0 .  nitrofurantoin , macrocrystal-monohydrate, (MACROBID ) 100 MG capsule, Take 1 capsule (100 mg total) by mouth 2 (two) times daily. (Patient not taking: Reported on 02/06/2024), Disp: 10 capsule, Rfl: 0   No Active Allergies   Review of Systems  Constitutional: Negative.   Eyes:  Negative for blurred vision.  Respiratory: Negative.    Cardiovascular: Negative.  Negative for palpitations.  Gastrointestinal: Negative.   Neurological: Negative.   Psychiatric/Behavioral: Negative.       Today's Vitals   02/06/24 0908  BP: 122/80  Pulse: 69  Temp: 98.6 F (37 C)  SpO2: 98%  Weight: 156 lb 6.4 oz (70.9 kg)  Height: 5' 4 (1.626 m)   Body mass index is 26.85  kg/m.  Wt Readings from Last 3 Encounters:  02/06/24 156 lb 6.4 oz (70.9 kg)  12/08/23 151 lb (68.5 kg)  10/24/23 156 lb (70.8 kg)     Objective:  Physical Exam Vitals and nursing note reviewed.  Constitutional:      Appearance: Normal appearance.  HENT:     Head: Normocephalic and atraumatic.  Eyes:     Extraocular Movements: Extraocular movements intact.  Cardiovascular:     Rate and Rhythm: Normal rate and regular rhythm.     Heart sounds: Normal heart sounds.  Pulmonary:     Effort: Pulmonary effort is normal.  Breath sounds: Normal breath sounds.  Musculoskeletal:     Cervical back: Normal range of motion.  Skin:    General: Skin is warm.  Neurological:     General: No focal deficit present.     Mental Status: She is alert.  Psychiatric:        Mood and Affect: Mood normal.        Behavior: Behavior normal.         Assessment And Plan:  Kidney stone   Assessment & Plan Nephrolithiasis with persistent flank pain Persistent flank pain likely due to nephrolithiasis. Previous x-ray showed a small calculus. Pain persists, indicating possible unresolved nephrolithiasis. No hematuria, but urine darker than usual. Nephrolithiasis most likely given history and symptoms. - Order CT scan to evaluate for nephrolithiasis. - Obtain records from urgent care for previous imaging and treatment details. - Refill tamsulosin  (Flomax ) to aid in stone passage. - Refer to urologist if necessary based on CT findings.  Anxiety disorder Anxiety disorder well-managed, but she desires to discontinue medication due to side effects. - Instruct to take anxiety medication every other day for one week. - Advise to email after ten days for further tapering instructions. Return if symptoms worsen or fail to improve.  Patient was given opportunity to ask questions. Patient verbalized understanding of the plan and was able to repeat key elements of the plan. All questions were answered to  their satisfaction.   I, Jessica LOISE Slocumb, MD, have reviewed all documentation for this visit. The documentation on 02/06/24 for the exam, diagnosis, procedures, and orders are all accurate and complete.   IF YOU HAVE BEEN REFERRED TO A SPECIALIST, IT MAY TAKE 1-2 WEEKS TO SCHEDULE/PROCESS THE REFERRAL. IF YOU HAVE NOT HEARD FROM US /SPECIALIST IN TWO WEEKS, PLEASE GIVE US  A CALL AT [phone].   THE PATIENT IS ENCOURAGED TO PRACTICE SOCIAL DISTANCING DUE TO THE COVID-19 PANDEMIC.

## 2024-02-08 DIAGNOSIS — N200 Calculus of kidney: Secondary | ICD-10-CM | POA: Insufficient documentation

## 2024-02-08 NOTE — Assessment & Plan Note (Signed)
 Persistent flank pain likely due to nephrolithiasis. Previous x-ray showed a small calculus. Pain persists, indicating possible unresolved nephrolithiasis. No hematuria, but urine darker than usual. Nephrolithiasis most likely given history and symptoms. - Order CT scan to evaluate for nephrolithiasis. - Obtain records from urgent care for previous imaging and treatment details. - Refill tamsulosin  (Flomax ) to aid in stone passage. - Refer to urologist if necessary based on CT findings.

## 2024-02-08 NOTE — Assessment & Plan Note (Addendum)
 Anxiety disorder well-managed, but she desires to discontinue medication due to side effects. - Instruct to take anxiety medication every other day for one week. - Advise to email after ten days for further tapering instructions.

## 2024-02-09 ENCOUNTER — Ambulatory Visit: Payer: Self-pay | Admitting: Internal Medicine

## 2024-02-13 ENCOUNTER — Ambulatory Visit
Admission: RE | Admit: 2024-02-13 | Discharge: 2024-02-13 | Disposition: A | Discharge status: Home / Self Care | Source: Ambulatory Visit | Attending: Internal Medicine | Admitting: Internal Medicine

## 2024-02-13 DIAGNOSIS — N200 Calculus of kidney: Secondary | ICD-10-CM | POA: Diagnosis not present

## 2024-02-28 ENCOUNTER — Other Ambulatory Visit: Payer: Self-pay | Admitting: Internal Medicine

## 2024-03-02 ENCOUNTER — Ambulatory Visit: Admitting: Radiology

## 2024-03-07 ENCOUNTER — Ambulatory Visit: Admitting: Radiology

## 2024-03-13 ENCOUNTER — Encounter: Payer: Self-pay | Admitting: Radiology

## 2024-03-13 ENCOUNTER — Ambulatory Visit (INDEPENDENT_AMBULATORY_CARE_PROVIDER_SITE_OTHER): Admitting: Radiology

## 2024-03-13 VITALS — BP 126/78 | HR 75 | Wt 155.0 lb

## 2024-03-13 DIAGNOSIS — N814 Uterovaginal prolapse, unspecified: Secondary | ICD-10-CM

## 2024-03-13 DIAGNOSIS — Z4689 Encounter for fitting and adjustment of other specified devices: Secondary | ICD-10-CM | POA: Diagnosis not present

## 2024-03-13 NOTE — Progress Notes (Signed)
      Subjective: Jessica Crosby is a 73 y.o. female here for pessary maintenance. No problems with pessary. Denies discharge, irritation or bleeding. Using estrace  twice weekly. Takes out and cleans 1-2 times week without difficulty. Still sexually active, does not removed pessary before intercourse. Interested in surgical options.   Review of Systems  All other systems reviewed and are negative.   Past Medical History:  Diagnosis Date   Arthritis    GERD (gastroesophageal reflux disease)    Hx of scarlet fever    as achild   Hypertension    Thyroid  disease    hyperparathyroidism   Vitamin D deficiency       Objective:  Today's Vitals   03/13/24 1345  BP: 126/78  Pulse: 75  SpO2: 99%  Weight: 155 lb (70.3 kg)    Body mass index is 26.61 kg/m.   Physical Exam Vitals and nursing note reviewed. Exam conducted with a chaperone present.  Constitutional:      Appearance: Normal appearance. She is normal weight.  Pulmonary:     Effort: Pulmonary effort is normal.  Genitourinary:    General: Normal vulva.     Labia:        Right: No rash.        Left: No rash.      Vagina: Prolapsed vaginal walls present. No vaginal discharge, erythema, tenderness, bleeding or lesions.     Comments: #6ring pessary with support removed without difficulty. Vagina inspected, no erosion, irritation or discharge. Pessary cleaned and put back in place easily. Tolerated well. Neurological:     Mental Status: She is alert.  Psychiatric:        Mood and Affect: Mood normal.        Thought Content: Thought content normal.        Judgment: Judgment normal.      Jessica Crosby, CMA present for exam  Assessment:/Plan:  1. Cystocele with uterine prolapse (Primary) - Ambulatory referral to Urogynecology  2. Pessary maintenance      Jessica Crosby, [facility] 03/13/24 1400

## 2024-03-15 ENCOUNTER — Encounter: Payer: Self-pay | Admitting: Internal Medicine

## 2024-03-19 NOTE — Progress Notes (Unsigned)
 Chief Complaint: No chief complaint on file.   History of Present Illness:  Jessica Crosby is a 73 y.o. female who is seen in consultation from Jarold Medici, MD for evaluation of ***.   Past Medical History:  Past Medical History:  Diagnosis Date   Arthritis    GERD (gastroesophageal reflux disease)    Hx of scarlet fever    as achild   Hypertension    Thyroid  disease    hyperparathyroidism   Vitamin D deficiency     Past Surgical History:  Past Surgical History:  Procedure Laterality Date   CHOLECYSTECTOMY  06/21/1997   PARATHYROIDECTOMY Right 06/21/2008    Allergies:  No Known Allergies  Family History:  Family History  Problem Relation Age of Onset   Breast cancer Sister 57   Prostate cancer Father 88   Breast cancer Maternal Aunt 81   Prostate cancer Paternal Uncle    Throat cancer Paternal Grandfather        pipe smoker   Breast cancer Sister 49   Lymphoma Sister 4   Leukemia Sister 50       as a result of chemotherapy from her lymphoma   Cancer Sister 13       non hodgins lymphoma/ leukemia   Pancreatic cancer Maternal Aunt 23   Colon cancer Maternal Aunt        diagnosed in her 55s   Thyroid  cancer Maternal Aunt        dx in her 17s   Leukemia Maternal Aunt        diagnosed in her 46s   Breast cancer Cousin        diagnosed in her 20s   Prostate cancer Paternal Uncle     Social History:  Social History   Tobacco Use   Smoking status: Never    Passive exposure: Never   Smokeless tobacco: Never  Vaping Use   Vaping status: Never Used  Substance Use Topics   Alcohol use: No   Drug use: No    Review of symptoms:  Constitutional:  Negative for unexplained weight loss, night sweats, fever, chills ENT:  Negative for nose bleeds, sinus pain, painful swallowing CV:  Negative for chest pain, shortness of breath, exercise intolerance, palpitations, loss of consciousness Resp:  Negative for cough, wheezing, shortness of breath GI:   Negative for nausea, vomiting, diarrhea, bloody stools GU:  Positives noted in HPI; otherwise negative for gross hematuria, dysuria, urinary incontinence Neuro:  Negative for seizures, poor balance, limb weakness, slurred speech Psych:  Negative for lack of energy, depression, anxiety Endocrine:  Negative for polydipsia, polyuria, symptoms of hypoglycemia (dizziness, hunger, sweating) Hematologic:  Negative for anemia, purpura, petechia, prolonged or excessive bleeding, use of anticoagulants  Allergic:  Negative for difficulty breathing or choking as a result of exposure to anything; no shellfish allergy; no allergic response (rash/itch) to materials, foods  Physical exam: LMP 06/21/1998  GENERAL APPEARANCE:  Well appearing, well developed, well nourished, NAD HEENT: Atraumatic, Normocephalic, oropharynx clear. NECK: Supple without lymphadenopathy or thyromegaly. LUNGS: Clear to auscultation bilaterally. HEART: Regular Rate and Rhythm without murmurs, gallops, or rubs. ABDOMEN: Soft, non-tender, No Masses. EXTREMITIES: Moves all extremities well.  Without clubbing, cyanosis, or edema. NEUROLOGIC:  Alert and oriented x 3, normal gait, CN II-XII grossly intact.  MENTAL STATUS:  Appropriate. BACK:  Non-tender to palpation.  No CVAT SKIN:  Warm, dry and intact.    Results: No results found for this or any previous visit (  from the past 24 hours).  I have reviewed prior patient's records  I have reviewed referring/prior physicians records  I have reviewed urinalysis  I have reviewed prior urine cultures  I reviewed prior imaging studies  Assessment: No diagnosis found.   Plan: ***

## 2024-03-20 ENCOUNTER — Ambulatory Visit: Admitting: Urology

## 2024-03-20 VITALS — BP 128/80 | HR 87

## 2024-03-20 DIAGNOSIS — N200 Calculus of kidney: Secondary | ICD-10-CM | POA: Diagnosis not present

## 2024-03-20 DIAGNOSIS — N201 Calculus of ureter: Secondary | ICD-10-CM

## 2024-03-27 ENCOUNTER — Encounter: Payer: Self-pay | Admitting: Urology

## 2024-03-27 ENCOUNTER — Other Ambulatory Visit: Payer: Self-pay

## 2024-03-27 DIAGNOSIS — N201 Calculus of ureter: Secondary | ICD-10-CM

## 2024-03-27 NOTE — Telephone Encounter (Signed)
 FYI and advise please

## 2024-03-30 DIAGNOSIS — H40013 Open angle with borderline findings, low risk, bilateral: Secondary | ICD-10-CM | POA: Diagnosis not present

## 2024-04-11 DIAGNOSIS — M8580 Other specified disorders of bone density and structure, unspecified site: Secondary | ICD-10-CM | POA: Diagnosis not present

## 2024-04-11 DIAGNOSIS — Z7982 Long term (current) use of aspirin: Secondary | ICD-10-CM | POA: Diagnosis not present

## 2024-04-11 DIAGNOSIS — E559 Vitamin D deficiency, unspecified: Secondary | ICD-10-CM | POA: Diagnosis not present

## 2024-04-11 DIAGNOSIS — N200 Calculus of kidney: Secondary | ICD-10-CM | POA: Diagnosis not present

## 2024-04-11 DIAGNOSIS — E210 Primary hyperparathyroidism: Secondary | ICD-10-CM | POA: Diagnosis not present

## 2024-04-11 DIAGNOSIS — I10 Essential (primary) hypertension: Secondary | ICD-10-CM | POA: Diagnosis not present

## 2024-04-11 DIAGNOSIS — E7800 Pure hypercholesterolemia, unspecified: Secondary | ICD-10-CM | POA: Diagnosis not present

## 2024-04-11 DIAGNOSIS — E663 Overweight: Secondary | ICD-10-CM | POA: Diagnosis not present

## 2024-04-11 DIAGNOSIS — F419 Anxiety disorder, unspecified: Secondary | ICD-10-CM | POA: Diagnosis not present

## 2024-04-25 ENCOUNTER — Other Ambulatory Visit: Payer: Self-pay | Admitting: Internal Medicine

## 2024-04-25 DIAGNOSIS — E7800 Pure hypercholesterolemia, unspecified: Secondary | ICD-10-CM

## 2024-04-26 ENCOUNTER — Encounter: Payer: Self-pay | Admitting: Internal Medicine

## 2024-04-26 ENCOUNTER — Ambulatory Visit: Payer: PPO | Admitting: Internal Medicine

## 2024-04-26 ENCOUNTER — Encounter: Payer: PPO | Admitting: Internal Medicine

## 2024-04-26 ENCOUNTER — Ambulatory Visit: Payer: Self-pay | Admitting: Internal Medicine

## 2024-04-26 VITALS — BP 130/70 | HR 70 | Temp 98.3°F | Ht 64.0 in | Wt 158.4 lb

## 2024-04-26 DIAGNOSIS — Z0000 Encounter for general adult medical examination without abnormal findings: Secondary | ICD-10-CM

## 2024-04-26 DIAGNOSIS — R10A2 Flank pain, left side: Secondary | ICD-10-CM | POA: Diagnosis not present

## 2024-04-26 DIAGNOSIS — R7309 Other abnormal glucose: Secondary | ICD-10-CM

## 2024-04-26 DIAGNOSIS — E7800 Pure hypercholesterolemia, unspecified: Secondary | ICD-10-CM | POA: Diagnosis not present

## 2024-04-26 DIAGNOSIS — I10 Essential (primary) hypertension: Secondary | ICD-10-CM

## 2024-04-26 DIAGNOSIS — F418 Other specified anxiety disorders: Secondary | ICD-10-CM

## 2024-04-26 LAB — POCT URINALYSIS DIPSTICK
Bilirubin, UA: NEGATIVE
Blood, UA: NEGATIVE
Glucose, UA: NEGATIVE
Ketones, UA: NEGATIVE
Leukocytes, UA: NEGATIVE
Nitrite, UA: NEGATIVE
Protein, UA: NEGATIVE
Spec Grav, UA: 1.02 (ref 1.010–1.025)
Urobilinogen, UA: 0.2 U/dL
pH, UA: 7 (ref 5.0–8.0)

## 2024-04-26 MED ORDER — AMLODIPINE BESYLATE 5 MG PO TABS: ORAL_TABLET | ORAL | 1 refills | Status: AC

## 2024-04-26 MED ORDER — TIZANIDINE HCL 4 MG PO TABS: 4.0000 mg | ORAL_TABLET | Freq: Every day | ORAL | 0 refills | Status: DC

## 2024-04-26 NOTE — Patient Instructions (Signed)

## 2024-04-26 NOTE — Progress Notes (Signed)
 I,Jessica Crosby, CMA,acting as a neurosurgeon for Jessica Crosby.,have documented all relevant documentation on the behalf of Jessica Crosby,as directed by  Jessica Crosby while in the presence of Jessica Crosby.  Subjective:    Patient ID: Jessica Crosby , female    DOB: 1950-07-24 , 73 y.o.   MRN: [id number]  Chief Complaint  Patient presents with   Annual Exam    She is here today for a full physical examination.  She is followed by Jessica Crosby for her GYN exams. She reports compliance with meds.  She denies headaches, chest pain and shortness of breath   Hypertension   Hyperlipidemia    HPI Discussed the use of AI scribe software for clinical note transcription with the patient, who gave verbal consent to proceed.  History of Present Illness Jessica Crosby is a 72 year old female who presents with persistent flank pain.  She has been experiencing persistent left flank pain since July 5th and July 8th, when she had a kidney stone. Despite previous imaging and a consultation with a urologist on September 30th, the pain persists. She was initially prescribed Flomax , which she stopped but resumed due to recurring pain. A CT scan is scheduled for tomorrow to further evaluate her condition.  Her current medications include amlodipine  5 mg daily, aspirin, atorvastatin  on Monday, Wednesday, and Friday, Lexapro  10 mg daily, and losartan  112.5 mg. She also uses an estrogen vaginal cream. She experiences pain when sleeping on her left side and occasionally while walking. She tries to mentally ignore the pain during physical activities. She remains active, walking daily with a friend and participating in yoga and her daughter's class.  Her bowel movements are reported as good. No large consumption of spinach or sweet tea, which are known to contribute to kidney stones. She is scheduled to see a urogynecologist for issues related to her pessary, as she  finds the maintenance cumbersome.   Hypertension This is a chronic problem. The current episode started more than 1 year ago. The problem has been gradually improving since onset. The problem is controlled. Pertinent negatives include no blurred vision. Risk factors for coronary artery disease include post-menopausal state. The current treatment provides moderate improvement. Compliance problems include exercise.      Past Medical History:  Diagnosis Date   Arthritis    GERD (gastroesophageal reflux disease)    Hx of scarlet fever    as achild   Hypertension    Thyroid  disease    hyperparathyroidism   Vitamin D deficiency      Family History  Problem Relation Age of Onset   Breast cancer Sister 65   Prostate cancer Father 38   Breast cancer Maternal Aunt 81   Prostate cancer Paternal Uncle    Throat cancer Paternal Grandfather        pipe smoker   Breast cancer Sister 54   Lymphoma Sister 4   Leukemia Sister 52       as a result of chemotherapy from her lymphoma   Cancer Sister 64       non hodgins lymphoma/ leukemia   Pancreatic cancer Maternal Aunt 59   Colon cancer Maternal Aunt        diagnosed in her 46s   Thyroid  cancer Maternal Aunt        dx in her 35s   Leukemia Maternal Aunt        diagnosed in her 35s  Breast cancer Cousin        diagnosed in her 31s   Prostate cancer Paternal Uncle      Current Outpatient Medications:    aspirin 81 MG tablet, Take 81 mg by mouth daily., Disp: , Rfl:    atorvastatin  (LIPITOR) 20 MG tablet, TAKE 1 TABLET BY MOUTH ON MONDAY WEDNESDAY AND FRIDAY, Disp: 45 tablet, Rfl: 2   calcium  carbonate (OS-CAL) 600 MG TABS, Take 600 mg by mouth 2 (two) times daily with a meal., Disp: , Rfl:    Cholecalciferol (VITAMIN D PO), Take 2,000 mg by mouth daily. , Disp: , Rfl:    escitalopram  (LEXAPRO ) 10 MG tablet, TAKE 1 TABLET BY MOUTH EVERY DAY, Disp: 90 tablet, Rfl: 1   estradiol  (ESTRACE ) 0.1 MG/GM vaginal cream, Apply one gram  vaginally twice weekly., Disp: 42.5 g, Rfl: 1   guaiFENesin (MUCINEX) 600 MG 12 hr tablet, Take 600 mg by mouth 2 (two) times daily. (Patient taking differently: Take 600 mg by mouth daily as needed for to loosen phlegm.), Disp: , Rfl:    losartan -hydrochlorothiazide  (HYZAAR) 100-12.5 MG tablet, Take 1 tablet by mouth daily., Disp: 90 tablet, Rfl: 2   Magnesium 250 MG TABS, Take by mouth., Disp: , Rfl:    Multiple Vitamin (THERA) TABS, Take 1 tablet by mouth daily., Disp: , Rfl:    Omega-3 Fatty Acids (FISH OIL) 1000 MG CAPS, Take 1 capsule by mouth., Disp: , Rfl:    tamsulosin  (FLOMAX ) 0.4 MG CAPS capsule, TAKE 1 CAPSULE BY MOUTH EVERY DAY AFTER SUPPER, Disp: 90 capsule, Rfl: 0   tiZANidine (ZANAFLEX) 4 MG tablet, Take 1 tablet (4 mg total) by mouth daily. In the evening as needed, Disp: 30 tablet, Rfl: 0   amLODipine  (NORVASC ) 5 MG tablet, TAKE 1 TABLET BY MOUTH DAILY AT NIGHT, Disp: 90 tablet, Rfl: 1   No Known Allergies    The patient states she uses post menopausal status for birth control. Patient's last menstrual period was 06/21/1998.. Negative for Dysmenorrhea. Negative for: breast discharge, breast lump(s), breast pain and breast self exam. Associated symptoms include abnormal vaginal bleeding. Pertinent negatives include abnormal bleeding (hematology), anxiety, decreased libido, depression, difficulty falling sleep, dyspareunia, history of infertility, nocturia, sexual dysfunction, sleep disturbances, urinary incontinence, urinary urgency, vaginal discharge and vaginal itching. Diet regular.The patient states her exercise level is  moderate.  . The patient's tobacco use is:  Social History   Tobacco Use  Smoking Status Never   Passive exposure: Never  Smokeless Tobacco Never  . She has been exposed to passive smoke. The patient's alcohol use is:  Social History   Substance and Sexual Activity  Alcohol Use No    Review of Systems  Constitutional: Negative.   HENT: Negative.     Eyes: Negative.  Negative for blurred vision.  Respiratory: Negative.    Cardiovascular: Negative.   Gastrointestinal: Negative.   Endocrine: Negative.   Genitourinary: Negative.   Musculoskeletal: Negative.   Skin: Negative.   Allergic/Immunologic: Negative.   Neurological: Negative.   Hematological: Negative.   Psychiatric/Behavioral: Negative.       Today's Vitals   04/26/24 0919  BP: 130/70  Pulse: 70  Temp: 98.3 F (36.8 C)  SpO2: 98%  Weight: 158 lb 6.4 oz (71.8 kg)  Height: 5' 4 (1.626 m)   Body mass index is 27.19 kg/m.  Wt Readings from Last 3 Encounters:  04/26/24 158 lb 6.4 oz (71.8 kg)  03/13/24 155 lb (70.3 kg)  02/06/24 156  lb 6.4 oz (70.9 kg)     Objective:  Physical Exam Vitals and nursing note reviewed.  Constitutional:      Appearance: Normal appearance.  HENT:     Head: Normocephalic and atraumatic.     Right Ear: Tympanic membrane, ear canal and external ear normal.     Left Ear: Tympanic membrane, ear canal and external ear normal.     Nose: Nose normal.     Mouth/Throat:     Mouth: Mucous membranes are moist.     Pharynx: Oropharynx is clear.  Eyes:     Extraocular Movements: Extraocular movements intact.     Conjunctiva/sclera: Conjunctivae normal.     Pupils: Pupils are equal, round, and reactive to light.  Cardiovascular:     Rate and Rhythm: Normal rate and regular rhythm.     Pulses: Normal pulses.     Heart sounds: Normal heart sounds.  Pulmonary:     Effort: Pulmonary effort is normal.     Breath sounds: Normal breath sounds.  Chest:  Breasts:    Tanner Score is 5.     Right: Normal.     Left: Normal.  Abdominal:     General: Abdomen is flat. Bowel sounds are normal.     Palpations: Abdomen is soft.     Comments: Left flank tenderness to palpation  Genitourinary:    Comments: deferred Musculoskeletal:        General: Normal range of motion.     Cervical back: Normal range of motion and neck supple.  Skin:     General: Skin is warm and dry.  Neurological:     General: No focal deficit present.     Mental Status: She is alert and oriented to person, place, and time.  Psychiatric:        Mood and Affect: Mood normal.        Behavior: Behavior normal.     Assessment And Plan:     Encounter for general adult medical examination w/o abnormal findings Assessment & Plan: A full exam was performed.  Importance of monthly self breast exams was discussed with the patient.  She is advised to get 30-45 minutes of regular exercise, no less than four to five days per week. Both weight-bearing and aerobic exercises are recommended.  She is advised to follow a healthy diet with at least six fruits/veggies per day, decrease intake of red meat and other saturated fats and to increase fish intake to twice weekly.  Meats/fish should not be fried -- baked, boiled or broiled is preferable. It is also important to cut back on your sugar intake.  Be sure to read labels - try to avoid anything with added sugar, high fructose corn syrup or other sweeteners.  If you must use a sweetener, you can try stevia or monkfruit.  It is also important to avoid artificially sweetened foods/beverages and diet drinks. Lastly, wear SPF 50 sunscreen on exposed skin and when in direct sunlight for an extended period of time.  Be sure to avoid fast food restaurants and aim for at least 60 ounces of water daily.    - Routine wellness visit with well-controlled blood pressure and normal EKG. Calcium  score zero two years ago. Bone density test due next year. Mammogram due in April. Screenings up to date. - Scheduled six-month and next year's physical based on lab work and back evaluation. - Administer tetanus vaccine between now and next year. - Schedule mammogram for April. - Schedule bone  density test for next year.    Essential hypertension Assessment & Plan: Chronic, controlled.  EKG performed, NSR w/o acute changes.  She will continue with  losartan /hct 100/12.5mg  and amlodipine  5mg  daily. She will f/u in six months for a full physical exam.  She is reminded to follow low sodium diet.   Orders: -     CBC -     CMP14+EGFR -     EKG 12-Lead -     POCT urinalysis dipstick -     Microalbumin / creatinine urine ratio -     amLODIPine  Besylate; TAKE 1 TABLET BY MOUTH DAILY AT NIGHT  Dispense: 90 tablet; Refill: 1  Pure hypercholesterolemia Assessment & Plan: Chronic, she is currently taking atorvastatin  20mg  MWF.   - Follow heart healthy lifestyle.   Orders: -     TSH  Left flank pain Assessment & Plan: Persistent left flank pain despite previous imaging and urology consultation. Differential includes musculoskeletal pain if no stone is found on upcoming CT scan. - Urology ordered CT scan for kidney evaluation. - Prescribed muscle relaxer as needed for pain management. - Advised to stop Flomax  after CT scan and try muscle relaxer if pain persists. - Instructed to send a message on Monday regarding muscle relaxer effectiveness.   Other orders -     tiZANidine HCl; Take 1 tablet (4 mg total) by mouth daily. In the evening as needed  Dispense: 30 tablet; Refill: 0   Return for 1 year physical, 6 month bp. Patient was given opportunity to ask questions. Patient verbalized understanding of the plan and was able to repeat key elements of the plan. All questions were answered to their satisfaction.    I, Jessica Crosby, have reviewed all documentation for this visit. The documentation on 04/26/24 for the exam, diagnosis, procedures, and orders are all accurate and complete.

## 2024-04-27 ENCOUNTER — Ambulatory Visit (HOSPITAL_COMMUNITY)
Admission: RE | Admit: 2024-04-27 | Discharge: 2024-04-27 | Disposition: A | Discharge status: Home / Self Care | Source: Ambulatory Visit | Attending: Urology | Admitting: Urology

## 2024-04-27 DIAGNOSIS — N201 Calculus of ureter: Secondary | ICD-10-CM | POA: Diagnosis not present

## 2024-04-27 DIAGNOSIS — K429 Umbilical hernia without obstruction or gangrene: Secondary | ICD-10-CM | POA: Diagnosis not present

## 2024-04-27 DIAGNOSIS — N200 Calculus of kidney: Secondary | ICD-10-CM | POA: Diagnosis not present

## 2024-04-27 LAB — CMP14+EGFR
ALT: 25 IU/L (ref 0–32)
AST: 23 IU/L (ref 0–40)
Albumin: 4.5 g/dL (ref 3.8–4.8)
Alkaline Phosphatase: 96 IU/L (ref 49–135)
BUN/Creatinine Ratio: 13 (ref 12–28)
BUN: 12 mg/dL (ref 8–27)
Bilirubin Total: 0.8 mg/dL (ref 0.0–1.2)
CO2: 25 mmol/L (ref 20–29)
Calcium: 9.5 mg/dL (ref 8.7–10.3)
Chloride: 102 mmol/L (ref 96–106)
Creatinine, Ser: 0.91 mg/dL (ref 0.57–1.00)
Globulin, Total: 2 g/dL (ref 1.5–4.5)
Glucose: 94 mg/dL (ref 70–99)
Potassium: 4 mmol/L (ref 3.5–5.2)
Sodium: 140 mmol/L (ref 134–144)
Total Protein: 6.5 g/dL (ref 6.0–8.5)
eGFR: 67 mL/min/1.73 (ref >59)

## 2024-04-27 LAB — CBC
Hematocrit: 44 % (ref 34.0–46.6)
Hemoglobin: 14.3 g/dL (ref 11.1–15.9)
MCH: 30.2 pg (ref 26.6–33.0)
MCHC: 32.5 g/dL (ref 31.5–35.7)
MCV: 93 fL (ref 79–97)
Platelets: 310 x10E3/uL (ref 150–450)
RBC: 4.73 x10E6/uL (ref 3.77–5.28)
RDW: 12.5 % (ref 11.7–15.4)
WBC: 6.2 x10E3/uL (ref 3.4–10.8)

## 2024-04-27 LAB — MICROALBUMIN / CREATININE URINE RATIO
Creatinine, Urine: 156.2 mg/dL
Microalb/Creat Ratio: 14 mg/g{creat} (ref 0–29)
Microalbumin, Urine: 22.5 ug/mL

## 2024-04-27 LAB — TSH: TSH: 2.11 u[IU]/mL (ref 0.450–4.500)

## 2024-04-30 ENCOUNTER — Encounter: Payer: Self-pay | Admitting: Internal Medicine

## 2024-05-01 ENCOUNTER — Ambulatory Visit: Payer: Self-pay | Admitting: Urology

## 2024-05-05 DIAGNOSIS — R10A2 Flank pain, left side: Secondary | ICD-10-CM | POA: Insufficient documentation

## 2024-05-05 NOTE — Assessment & Plan Note (Addendum)
 A full exam was performed.  Importance of monthly self breast exams was discussed with the patient.  She is advised to get 30-45 minutes of regular exercise, no less than four to five days per week. Both weight-bearing and aerobic exercises are recommended.  She is advised to follow a healthy diet with at least six fruits/veggies per day, decrease intake of red meat and other saturated fats and to increase fish intake to twice weekly.  Meats/fish should not be fried -- baked, boiled or broiled is preferable. It is also important to cut back on your sugar intake.  Be sure to read labels - try to avoid anything with added sugar, high fructose corn syrup or other sweeteners.  If you must use a sweetener, you can try stevia or monkfruit.  It is also important to avoid artificially sweetened foods/beverages and diet drinks. Lastly, wear SPF 50 sunscreen on exposed skin and when in direct sunlight for an extended period of time.  Be sure to avoid fast food restaurants and aim for at least 60 ounces of water daily.    - Routine wellness visit with well-controlled blood pressure and normal EKG. Calcium  score zero two years ago. Bone density test due next year. Mammogram due in April. Screenings up to date. - Scheduled six-month and next year's physical based on lab work and back evaluation. - Administer tetanus vaccine between now and next year. - Schedule mammogram for April. - Schedule bone density test for next year.

## 2024-05-05 NOTE — Assessment & Plan Note (Signed)
 Persistent left flank pain despite previous imaging and urology consultation. Differential includes musculoskeletal pain if no stone is found on upcoming CT scan. - Urology ordered CT scan for kidney evaluation. - Prescribed muscle relaxer as needed for pain management. - Advised to stop Flomax  after CT scan and try muscle relaxer if pain persists. - Instructed to send a message on Monday regarding muscle relaxer effectiveness.

## 2024-05-05 NOTE — Assessment & Plan Note (Signed)
 Chronic, she is currently taking atorvastatin  20mg  MWF.   - Follow heart healthy lifestyle.

## 2024-05-05 NOTE — Assessment & Plan Note (Signed)
 Chronic, controlled.  EKG performed, NSR w/o acute changes.  She will continue with losartan /hct 100/12.5mg  and amlodipine  5mg  daily. She will f/u in six months for a full physical exam.  She is reminded to follow low sodium diet.

## 2024-05-19 ENCOUNTER — Other Ambulatory Visit: Payer: Self-pay | Admitting: Internal Medicine

## 2024-05-28 ENCOUNTER — Encounter: Payer: Self-pay | Admitting: Internal Medicine

## 2024-06-04 ENCOUNTER — Other Ambulatory Visit: Payer: Self-pay | Admitting: Internal Medicine

## 2024-06-04 ENCOUNTER — Other Ambulatory Visit: Payer: Self-pay | Admitting: Radiology

## 2024-06-04 DIAGNOSIS — N952 Postmenopausal atrophic vaginitis: Secondary | ICD-10-CM

## 2024-06-05 NOTE — Telephone Encounter (Signed)
 Med refill request: estradiol  0.1 vaginal cream Last OV: 03/13/24 pessary maintenance Last AEX: 08/31/23 Next AEX: 09/12/24 10/14/23 BI-RADS 1 negative Refill authorized: estradiol  0.1 vaginal cream 42.5 gram, 1 refill

## 2024-06-20 ENCOUNTER — Encounter: Payer: Self-pay | Admitting: Internal Medicine

## 2024-07-16 ENCOUNTER — Ambulatory Visit: Admitting: Obstetrics

## 2024-07-18 NOTE — Progress Notes (Unsigned)
 " New Patient Evaluation and Consultation  Referring Provider: Ginette Shasta NOVAK, NP PCP: Jarold Medici, MD Date of Service: 07/19/2024  SUBJECTIVE Chief Complaint: No chief complaint on file.  History of Present Illness: Jessica Crosby is a 74 y.o. White or Caucasian female seen in consultation at the request of NP Chrzanowski for evaluation of pelvic organ prolapse.    Prolapse managed by self management of #6 ring with support pessary 1-2x/week Pessary use since 2022 for grade 3 uterine prolapse Failed size 4 ring with support pessary Using vaginal estrogen twice weekly History of nephrolithiasis managed by Dr. Matilda, ED evaluation 12/27/23 for intermittent left flank pain, dark urine and urinary frequency.  Cervical polyp removed by Dr. Cleotilde in 2019   ***Review of records significant for: ***bilateral hip pain, open angle glaucoma  CT renal stone 04/27/24  CLINICAL DATA:  Calculus of ureter   EXAM: CT ABDOMEN AND PELVIS WITHOUT CONTRAST   TECHNIQUE: Multidetector CT imaging of the abdomen and pelvis was performed following the standard protocol without IV contrast.   RADIATION DOSE REDUCTION: This exam was performed according to the departmental dose-optimization program which includes automated exposure control, adjustment of the mA and/or kV according to patient size and/or use of iterative reconstruction technique.   COMPARISON:  CT of the abdomen and pelvis performed February 13, 2024   FINDINGS: Lower chest: No acute abnormality.   Hepatobiliary: Postsurgical changes from cholecystectomy.   Pancreas: Unremarkable. No pancreatic ductal dilatation or surrounding inflammatory changes.   Spleen: Normal in size without focal abnormality.   Adrenals/Urinary Tract: The adrenal glands are within normal limits.   Right kidney: A small 2 mm calculus is present in the right lower pole. Right ureter is unremarkable.   Left kidney: No evidence of left-sided  nephrolithiasis. The left ureter is within normal limits. The previously observed column of calcifications within the left ureter are not seen on the current exam.   Urinary bladder is decompressed and unremarkable.   Stomach/Bowel: No dilated loops of bowel are seen. The appendix is well seen and there is no evidence of acute appendicitis.   Vascular/Lymphatic: No evidence of significant lymphadenopathy. No abdominal aortic aneurysm.   Reproductive: A pessary appliance is in place.   Other: Small fat containing umbilical hernia.   Musculoskeletal: Mild-to-moderate degenerative changes are present in the lumbar spine.   IMPRESSION: 1. A nonobstructing 2 mm stone is present in the lower pole of the right kidney without hydronephrosis, unchanged. 2. The previously observed column of calcifications in the left ureter are not seen on the current exam.     Electronically Signed   By: Maude Naegeli M.D.   On: 05/01/2024 08:46  Urinary Symptoms: {urine leakage?:24754} Leaks *** time(s) per {days/wks/mos/yrs:310907}.  Pad use: {NUMBERS 1-10:18281} {pad option:24752} per day.   Patient {ACTION; IS/IS WNU:78978602} bothered by UI symptoms.  Day time voids ***.  Nocturia: *** times per night to void with insomnia Voiding dysfunction:  {empties:24755} bladder well.  Patient {DOES NOT does:27190::does not} use a catheter to empty bladder.  When urinating, patient feels {urine symptoms:24756} Drinks: *** per day  UTIs: {NUMBERS 1-10:18281} UTI's in the last year.   {ACTIONS;DENIES/REPORTS:21021675::Denies} history of {urologic concerns:24757} No results found for the last 90 days.   Pelvic Organ Prolapse Symptoms:                  Patient {denies/ admits to:24761} a feeling of a bulge the vaginal area. It has been present for {NUMBER 1-10:22536} {  days/wks/mos/yrs:310907}.  Patient {denies/ admits to:24761} seeing a bulge.  This bulge {ACTION; IS/IS WNU:78978602}  bothersome.  Bowel Symptom: Bowel movements: *** time(s) per {Time; day/week/month:13537} with hemorrhoids Stool consistency: {stool consistency:24758} Straining: {yes/no:19897}.  Splinting: {yes/no:19897}.  Incomplete evacuation: {yes/no:19897}.  Patient {denies/ admits to:24761} accidental bowel leakage / fecal incontinence  Occurs: *** time(s) per {Time; day/week/month:13537}  Consistency with leakage: {stool consistency:24758} Bowel regimen: {bowel regimen:24759} Last colonoscopy: Date ***, Results *** [facility] Colonoscopy          Upcoming     Colonoscopy (Every 10 Years) Next due on 02/04/2032    02/03/2022  [facility] COLONOSCOPY  Only the first 1 history entries have been loaded, but more history exists.               Sexual Function Sexually active: yes. Denies pessary removal prior to intercourse Sexual orientation: {Sexual [phone]} Pain with sex: {pain with sex:24762}  Pelvic Pain {denies/ admits to:24761} pelvic pain Location: *** Pain occurs: *** Prior pain treatment: *** Improved by: *** Worsened by: ***   Past Medical History:  Past Medical History:  Diagnosis Date   Arthritis    GERD (gastroesophageal reflux disease)    Hx of scarlet fever    as achild   Hypertension    Thyroid  disease    hyperparathyroidism   Vitamin D deficiency      Past Surgical History:   Past Surgical History:  Procedure Laterality Date   CHOLECYSTECTOMY  06/21/1997   PARATHYROIDECTOMY Right 06/21/2008     Past OB/GYN History: OB History  Gravida Para Term Preterm AB Living  3 3 3   3   SAB IAB Ectopic Multiple Live Births          # Outcome Date GA Lbr Len/2nd Weight Sex Type Anes PTL Lv  3 Term 63 [redacted]w[redacted]d   F      2 Term 3 [redacted]w[redacted]d   M      1 Term 1971 [redacted]w[redacted]d   F        Vaginal deliveries: ***,  Forceps/ Vacuum deliveries: ***, Cesarean section: *** Menopausal: Yes, at age ***, {denies/ admits to:24761} vaginal bleeding since  menopause Contraception: ***. Last pap smear.  Any history of abnormal pap smears: {yes/no:19897}.    Component Value Date/Time   DIAGPAP  06/22/2018 0000    NEGATIVE FOR INTRAEPITHELIAL LESIONS OR MALIGNANCY.   DIAGPAP  06/10/2017 0000    NEGATIVE FOR INTRAEPITHELIAL LESIONS OR MALIGNANCY.   ADEQPAP  06/22/2018 0000    Satisfactory for evaluation  endocervical/transformation zone component PRESENT.   ADEQPAP  06/10/2017 0000    Satisfactory for evaluation  endocervical/transformation zone component PRESENT.    Medications: Patient has a current medication list which includes the following prescription(s): amlodipine , aspirin, atorvastatin , calcium  carbonate, vitamin d, escitalopram , estradiol , guaifenesin, losartan -hydrochlorothiazide , magnesium, thera, fish oil, tamsulosin , and tizanidine .   Allergies: Patient has no known allergies.   Social History: Social History[1]  Relationship status: {relationship status:24764} Patient lives with ***.   Patient {ACTION; IS/IS WNU:78978602} employed ***. Regular exercise: {Yes/No:304960894} History of abuse: {Yes/No:304960894}  Family History:   Family History  Problem Relation Age of Onset   Breast cancer Sister 71   Prostate cancer Father 22   Breast cancer Maternal Aunt 81   Prostate cancer Paternal Uncle    Throat cancer Paternal Grandfather        pipe smoker   Breast cancer Sister 62   Lymphoma Sister 73   Leukemia Sister 61  as a result of chemotherapy from her lymphoma   Cancer Sister 15       non hodgins lymphoma/ leukemia   Pancreatic cancer Maternal Aunt 61   Colon cancer Maternal Aunt        diagnosed in her 52s   Thyroid  cancer Maternal Aunt        dx in her 23s   Leukemia Maternal Aunt        diagnosed in her 62s   Breast cancer Cousin        diagnosed in her 58s   Prostate cancer Paternal Uncle      Review of Systems: ROS   OBJECTIVE Physical Exam: There were no vitals filed for this  visit.  Physical Exam Constitutional:      General: She is not in acute distress.    Appearance: Normal appearance.  Genitourinary:     Bladder and urethral meatus normal.     No lesions in the vagina.     Right Labia: No rash, tenderness, lesions, skin changes or Bartholin's cyst.    Left Labia: No tenderness, lesions, skin changes, Bartholin's cyst or rash.    No vaginal discharge, erythema, tenderness, bleeding, ulceration or granulation tissue.      Right Adnexa: not tender, not full and no mass present.    Left Adnexa: not tender, not full and no mass present.    No cervical motion tenderness, discharge, friability, lesion, polyp or nabothian cyst.     Uterus is not enlarged, fixed, tender or irregular.     No uterine mass detected.    Urethral meatus caruncle not present.    No urethral prolapse, tenderness, mass, hypermobility or discharge present.     Bladder is not tender, urgency on palpation not present and masses not present.      Levator ani not tender, obturator internus not tender, no asymmetrical contractions present and no pelvic spasms present.    Anal wink present and BC reflex present. Cardiovascular:     Rate and Rhythm: Normal rate.  Pulmonary:     Effort: Pulmonary effort is normal. No respiratory distress.  Abdominal:     General: There is no distension.     Palpations: There is no mass.     Tenderness: There is no abdominal tenderness.     Hernia: No hernia is present.  Neurological:     Mental Status: She is alert.  Vitals reviewed. Exam conducted with a chaperone present.      POP-Q:   POP-Q                                               Aa                                               Ba                                                 C  Gh                                               Pb                                               tvl                                                Ap                                                Bp                                                 D      Rectal Exam:  Normal sphincter tone, {rectocele:24766} distal rectocele, enterocoele {DESC; PRESENT/NOT PRESENT:21021351}, no rectal masses, {sign of:24767} dyssynergia when asking the patient to bear down.  Post-Void Residual (PVR) by Bladder Scan: In order to evaluate bladder emptying, we discussed obtaining a postvoid residual and patient agreed to this procedure.  Procedure: The ultrasound unit was placed on the patient's abdomen in the suprapubic region after the patient had voided.      Laboratory Results: Lab Results  Component Value Date   COLORU yellow 04/26/2024   CLARITYU clear 04/26/2024   GLUCOSEUR Negative 04/26/2024   BILIRUBINUR Negative 04/26/2024   KETONESU Negative 04/26/2024   SPECGRAV 1.020 04/26/2024   RBCUR Negative 04/26/2024   PHUR 7.0 04/26/2024   PROTEINUR Negative 04/26/2024   UROBILINOGEN 0.2 04/26/2024   LEUKOCYTESUR Negative 04/26/2024    Lab Results  Component Value Date   CREATININE 0.91 04/26/2024   CREATININE 0.94 10/24/2023   CREATININE 0.93 04/25/2023    Lab Results  Component Value Date   HGBA1C 5.5 08/17/2023    Lab Results  Component Value Date   HGB 14.3 04/26/2024     ASSESSMENT AND PLAN Ms. Ripley is a 74 y.o. with: No diagnosis found.  There are no diagnoses linked to this encounter.   Jessica ONEIDA Gillis, MD        [1]  Social History Tobacco Use   Smoking status: Never    Passive exposure: Never   Smokeless tobacco: Never  Vaping Use   Vaping status: Never Used  Substance Use Topics   Alcohol use: No   Drug use: No   "

## 2024-07-19 ENCOUNTER — Ambulatory Visit: Admitting: Obstetrics

## 2024-07-19 ENCOUNTER — Encounter: Payer: Self-pay | Admitting: Obstetrics

## 2024-07-19 VITALS — BP 128/78 | HR 73 | Ht 64.0 in | Wt 156.0 lb

## 2024-07-19 DIAGNOSIS — N8110 Cystocele, unspecified: Secondary | ICD-10-CM

## 2024-07-19 DIAGNOSIS — Z960 Presence of urogenital implants: Secondary | ICD-10-CM

## 2024-07-19 DIAGNOSIS — R351 Nocturia: Secondary | ICD-10-CM

## 2024-07-19 LAB — POCT URINALYSIS DIP (CLINITEK)
Bilirubin, UA: NEGATIVE
Blood, UA: NEGATIVE
Glucose, UA: NEGATIVE mg/dL
Nitrite, UA: NEGATIVE
Spec Grav, UA: 1.02
Urobilinogen, UA: 0.2 U/dL
pH, UA: 7

## 2024-07-19 NOTE — Assessment & Plan Note (Addendum)
-   desires to continue self management #6 ring with support pessary - discussed risk of change in urinary or bowel symptoms, vaginal ulceration, discharge, bleeding, fistula formation.  - encouraged to remove at time of intercourse - can consider size 7 ring with support pessary - continue low dose estrogen use 2x/week

## 2024-07-19 NOTE — Assessment & Plan Note (Addendum)
-   self management of #6 ring with support pessary since 2022 - For treatment of pelvic organ prolapse, we discussed options for management including expectant management, conservative management, and surgical management, such as Kegels, a pessary, pelvic floor physical therapy, and specific surgical procedures. - We discussed two options for prolapse repair:  1) vaginal repair without mesh - Pros - safer, no mesh complications - Cons - not as strong as mesh repair, higher risk of recurrence  2) laparoscopic repair with mesh - Pros - stronger, better long-term success - Cons - risks of mesh implant (erosion into vagina or bladder, adhering to the rectum, pain) - these risks are lower than with a vaginal mesh but still exist - desires to avoid surgical intervention

## 2024-07-19 NOTE — Assessment & Plan Note (Addendum)
-   denies UTI symptoms, UA + leuk only - avoid fluid intake 3 hours before bedtime - due to snoring, consider sleep study to r/o sleep apnea

## 2024-07-19 NOTE — Patient Instructions (Addendum)
 You have a stage 3 (out of 4) prolapse.  We discussed the fact that it is not life threatening but there are several treatment options. For treatment of pelvic organ prolapse, we discussed options for management including expectant management, conservative management, and surgical management, such as Kegels, a pessary, pelvic floor physical therapy, and specific surgical procedures.     We discussed two options for prolapse repair:  1) vaginal repair without mesh - Pros - safer, no mesh complications - Cons - not as strong as mesh repair, higher risk of recurrence  2) laparoscopic repair with mesh - Pros - stronger, better long-term success - Cons - risks of mesh implant (erosion into vagina or bladder, adhering to the rectum, pain) - these risks are lower than with a vaginal mesh but still exist  For night time frequency: - avoid fluid intake 3 hours before bedtime - due to snoring, consider sleep study to rule out sleep apnea  Constipation: Our goal is to achieve formed bowel movements daily or every-other-day.  You may need to try different combinations of the following options to find what works best for you - everybody's body works differently so feel free to adjust the dosages as needed.  Some options to help maintain bowel health include:  Dietary changes (more leafy greens, vegetables and fruits; less processed foods) Fiber supplementation (Benefiber, FiberCon, Metamucil or Psyllium). Start slow and increase gradually to full dose. Over-the-counter agents such as: stool softeners (Docusate or Colace) and/or laxatives (Miralax, milk of magnesia)  Power Pudding is a natural mixture that may help your constipation.  To make blend 1 cup applesauce, 1 cup wheat bran, and 3/4 cup prune juice, refrigerate and then take 1 tablespoon daily with a large glass of water as needed.   Women should try to eat at least 21 to 25 grams of fiber a day, while men should aim for 30 to 38 grams a day. You  can add fiber to your diet with food or a fiber supplement such as psyllium (metamucil), benefiber, or fibercon.   Here's a look at how much dietary fiber is found in some common foods. When buying packaged foods, check the Nutrition Facts label for fiber content. It can vary among brands.  Fruits Serving size Total fiber (grams)*  Raspberries 1 cup 8.0  Pear 1 medium 5.5  Apple, with skin 1 medium 4.5  Banana 1 medium 3.0  Orange 1 medium 3.0  Strawberries 1 cup 3.0   Vegetables Serving size Total fiber (grams)*  Green peas, boiled 1 cup 9.0  Broccoli, boiled 1 cup chopped 5.0  Turnip greens, boiled 1 cup 5.0  Brussels sprouts, boiled 1 cup 4.0  Potato, with skin, baked 1 medium 4.0  Sweet corn, boiled 1 cup 3.5  Cauliflower, raw 1 cup chopped 2.0  Carrot, raw 1 medium 1.5   Grains Serving size Total fiber (grams)*  Spaghetti, whole-wheat, cooked 1 cup 6.0  Barley, pearled, cooked 1 cup 6.0  Bran flakes 3/4 cup 5.5  Quinoa, cooked 1 cup 5.0  Oat bran muffin 1 medium 5.0  Oatmeal, instant, cooked 1 cup 5.0  Popcorn, air-popped 3 cups 3.5  Brown rice, cooked 1 cup 3.5  Bread, whole-wheat 1 slice 2.0  Bread, rye 1 slice 2.0   Legumes, nuts and seeds Serving size Total fiber (grams)*  Split peas, boiled 1 cup 16.0  Lentils, boiled 1 cup 15.5  Black beans, boiled 1 cup 15.0  Baked beans, canned 1 cup 10.0  Chia seeds 1 ounce 10.0  Almonds 1 ounce (23 nuts) 3.5  Pistachios 1 ounce (49 nuts) 3.0  Sunflower kernels 1 ounce 3.0  *Rounded to nearest 0.5 gram. Source: [employer] for [employer], [employer] [place]

## 2024-09-12 ENCOUNTER — Ambulatory Visit: Admitting: Radiology

## 2024-10-24 ENCOUNTER — Ambulatory Visit: Admitting: Internal Medicine

## 2025-05-02 ENCOUNTER — Encounter: Payer: Self-pay | Admitting: Internal Medicine
